# Patient Record
Sex: Male | Born: 1960 | Race: White | Hispanic: No | Marital: Single | Smoking: Former smoker
Health system: Southern US, Community
[De-identification: ages and names within clinical notes are randomized; demographics above are authoritative.]

## PROBLEM LIST (undated history)

## (undated) DIAGNOSIS — K219 Gastro-esophageal reflux disease without esophagitis: Secondary | ICD-10-CM

## (undated) DIAGNOSIS — Z86718 Personal history of other venous thrombosis and embolism: Secondary | ICD-10-CM

## (undated) DIAGNOSIS — J449 Chronic obstructive pulmonary disease, unspecified: Secondary | ICD-10-CM

## (undated) HISTORY — PX: CARDIAC CATHETERIZATION: SHX172

---

## 2010-12-08 LAB — CBC WITH AUTOMATED DIFF
ABS. BASOPHILS: 0 10*3/uL (ref 0.0–0.2)
ABS. EOSINOPHILS: 0 10*3/uL (ref 0.0–0.8)
ABS. IMM. GRANS.: 0 10*3/uL (ref 0.0–0.5)
ABS. LYMPHOCYTES: 1.5 10*3/uL (ref 0.5–4.6)
ABS. MONOCYTES: 1 10*3/uL (ref 0.1–1.3)
ABS. NEUTROPHILS: 8.9 10*3/uL — ABNORMAL HIGH (ref 1.7–8.2)
BASOPHILS: 0 % (ref 0.0–2.0)
EOSINOPHILS: 0 % — ABNORMAL LOW (ref 0.5–7.8)
HCT: 34.5 % — ABNORMAL LOW (ref 41.1–50.3)
HGB: 11.6 g/dL — ABNORMAL LOW (ref 13.2–17.1)
IMMATURE GRANULOCYTES: 0.2 % (ref 0.0–5.0)
LYMPHOCYTES: 13 % (ref 13–44)
MCH: 29.1 PG (ref 26.1–32.9)
MCHC: 33.6 g/dL (ref 31.4–35.0)
MCV: 86.7 FL (ref 79.6–97.8)
MONOCYTES: 9 % (ref 4.0–12.0)
MPV: 10.7 FL — ABNORMAL LOW (ref 10.8–14.1)
NEUTROPHILS: 78 % (ref 43–78)
PLATELET: 186 10*3/uL (ref 150–450)
RBC: 3.98 M/uL — ABNORMAL LOW (ref 4.23–5.67)
RDW: 14.1 % (ref 11.9–14.6)
WBC: 11.4 10*3/uL — ABNORMAL HIGH (ref 4.3–11.1)

## 2010-12-08 LAB — METABOLIC PANEL, COMPREHENSIVE
A-G Ratio: 1.2 (ref 1.2–3.5)
ALT (SGPT): 36 U/L — ABNORMAL LOW (ref 39–65)
AST (SGOT): 44 U/L — ABNORMAL HIGH (ref 15–37)
Albumin: 4 g/dL (ref 3.5–5.0)
Alk. phosphatase: 94 U/L (ref 50–136)
Anion gap: 6 mmol/L — ABNORMAL LOW (ref 7–16)
BUN: 24 MG/DL — ABNORMAL HIGH (ref 6–23)
Bilirubin, total: 0.7 MG/DL (ref 0.2–1.1)
CO2: 26 MMOL/L (ref 23–32)
Calcium: 8.9 MG/DL (ref 8.3–10.4)
Chloride: 107 MMOL/L (ref 98–107)
Creatinine: 1.2 MG/DL (ref 0.8–1.5)
GFR est AA: >60 mL/min/{1.73_m2} (ref >60)
GFR est non-AA: >60 mL/min/{1.73_m2} (ref >60)
Globulin: 3.4 g/dL (ref 2.3–3.5)
Glucose: 115 MG/DL — ABNORMAL HIGH (ref 65–100)
Potassium: 3.4 MMOL/L — ABNORMAL LOW (ref 3.5–5.1)
Protein, total: 7.4 g/dL (ref 6.3–8.2)
Sodium: 139 MMOL/L (ref 136–145)

## 2010-12-08 LAB — POC CARDIAC MARKERS W BNP
BNP: 20 pg/mL (ref 0.0–100.0)
CK-MB: 5.4 ng/mL (ref 0.0–8.0)
Myoglobin: >500 ng/mL — ABNORMAL HIGH (ref 0–170)
Troponin-I: <0.05 ng/mL (ref 0.00–0.30)

## 2010-12-08 LAB — EKG, 12 LEAD, INITIAL
Atrial Rate: 109 {beats}/min
Calculated P Axis: 70 degrees
Calculated R Axis: 23 degrees
Calculated T Axis: 55 degrees
P-R Interval: 156 ms
Q-T Interval: 330 ms
QRS Duration: 84 ms
QTC Calculation (Bezet): 444 ms
Ventricular Rate: 109 {beats}/min

## 2010-12-08 LAB — LIPASE: Lipase: 94 U/L (ref 73–393)

## 2010-12-08 LAB — PROTHROMBIN TIME + INR
INR: 1.1 (ref 0.9–1.2)
Prothrombin time: 11.5 s — ABNORMAL HIGH (ref 9.4–10.8)

## 2010-12-08 LAB — POC CARDIAC MARKERS
CK-MB: 3.5 ng/mL (ref 0.0–8.0)
Myoglobin: 317 ng/mL — ABNORMAL HIGH (ref 0–170)
Troponin-I: <0.05 ng/mL (ref 0.00–0.30)

## 2010-12-08 LAB — PTT: aPTT: 27.9 s (ref 23.5–31.7)

## 2010-12-08 MED ORDER — ASPIRIN 325 MG TAB
325 mg | ORAL | Status: AC
Start: 2010-12-08 — End: 2010-12-08
  Administered 2010-12-08: 06:00:00 via ORAL

## 2010-12-08 MED ORDER — NITROGLYCERIN 0.4 MG SUBLINGUAL TAB
0.4 mg | SUBLINGUAL | Status: DC | PRN
Start: 2010-12-08 — End: 2010-12-08

## 2010-12-08 MED FILL — NITROGLYCERIN 0.4 MG SUBLINGUAL TAB: 0.4 mg | SUBLINGUAL | Qty: 1

## 2010-12-08 MED FILL — ASPIRIN 325 MG TAB: 325 mg | ORAL | Qty: 1

## 2010-12-08 NOTE — ED Notes (Signed)
Pt brought to ER by EMS c/o pt states he was 'paranoid and having chest pain and wandering in the middle of the street " after using cocaine all day today  / passing car called EMS per pt.  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

## 2010-12-08 NOTE — ED Notes (Signed)
I have reviewed medications, follow up provider options, and discharge instructions with the patient. The patient verbalized understanding. Copy of discharge information given to patient upon discharge. Patient discharged ambulatory in no distress.

## 2010-12-08 NOTE — ED Notes (Signed)
Patient states chest pain easing off and that he does not want to take the nitroglycerin tabs.

## 2010-12-08 NOTE — ED Provider Notes (Signed)
HPI Comments: Pt with 2 cardiac stents 2 years ago presents with chest pain, substernal all day long after relapsing and using cocaine all day. Some dyspnea.    Patient is a 49 y.o. male presenting with chest pain. The history is provided by the patient.   Chest Pain (Angina)   This is a new problem. The current episode started 6 to 12 hours ago. The problem has not changed since onset. Episode Length: all day. The problem occurs constantly. The pain is associated with normal activity. The pain is present in the substernal region. The pain is severe. The quality of the pain is described as pressure-like. The pain does not radiate. Associated symptoms include nausea, weakness and shortness of breath. Pertinent negatives include no fever and no vomiting. He has tried nothing for the symptoms. Risk factors include smoking/tobacco exposure, cardiac disease, hypertension and male gender (cocaine). His past medical history is significant for HTN.Procedural history includes cardiac catheterization and cardiac stents.       Past Medical History   Diagnosis Date   ??? COPD    ??? CAD (coronary artery disease)    ??? Hypertension    ??? Seizures    ??? Gastrointestinal disorder      acid reflux and ulcers          Past Surgical History   Procedure Date   ??? Cardiac surg procedure unlist      2 stents           No family history on file.     History   Social History   ??? Marital Status: N/A     Spouse Name: N/A     Number of Children: N/A   ??? Years of Education: N/A   Occupational History   ??? Not on file.   Social History Main Topics   ??? Smoking status: Current Everyday Smoker -- 2.0 packs/day   ??? Smokeless tobacco: Not on file   ??? Alcohol Use: No   ??? Drug Use: Yes     Special: Cocaine   ??? Sexually Active:    Other Topics Concern   ??? Not on file   Social History Narrative   ??? No narrative on file                    ALLERGIES: Review of patient's allergies indicates no known allergies.      Review of Systems    Constitutional: Positive for fatigue. Negative for fever and chills.   Respiratory: Positive for shortness of breath.    Cardiovascular: Positive for chest pain.   Gastrointestinal: Positive for nausea. Negative for vomiting.   Neurological: Positive for weakness.   All other systems reviewed and are negative.        Filed Vitals:    12/08/10 0000   BP: 129/78   Pulse: 117   Temp: 98.7 ??F (37.1 ??C)   Resp: 18   Height: 5\' 10"  (1.778 m)   Weight: 200 lb (90.719 kg)   SpO2: 92%              Physical Exam   Nursing note and vitals reviewed.  Constitutional: He is oriented to person, place, and time. He appears well-developed and well-nourished. He appears distressed.   HENT:   Head: Normocephalic and atraumatic.   Right Ear: External ear normal.   Left Ear: External ear normal.   Eyes: Conjunctivae and EOM are normal. Pupils are equal, round, and reactive to light.  Neck: Normal range of motion. Neck supple.   Cardiovascular: Normal rate, regular rhythm, normal heart sounds and intact distal pulses.    Pulmonary/Chest: Effort normal and breath sounds normal. No respiratory distress. He has no wheezes.   Abdominal: Soft. Bowel sounds are normal. No tenderness.   Musculoskeletal: Normal range of motion. He exhibits no edema and no tenderness.   Neurological: He is alert and oriented to person, place, and time. No cranial nerve deficit. He exhibits normal muscle tone. Coordination normal.   Skin: Skin is warm and dry. No rash noted. He is not diaphoretic. No erythema. No pallor.   Psychiatric: He has a normal mood and affect. His behavior is normal. Judgment and thought content normal.        MDM    Procedures    The following, medication list, allergies, family history, social history, lab results, imaging, were reviewed. Vital signs including pulse oximetry reviewed.    Recent Results (from the past 24 hour(s))   CBC WITH AUTOMATED DIFF    Collection Time    12/08/10 12:50 AM   Component Value Range    ??? WBC 11.4 (*) 4.3 - 11.1 (K/uL)   ??? RBC 3.98 (*) 4.23 - 5.67 (M/uL)   ??? HGB 11.6 (*) 13.2 - 17.1 (g/dL)   ??? HCT 34.5 (*) 41.1 - 50.3 (%)   ??? MCV 86.7  79.6 - 97.8 (FL)   ??? MCH 29.1  26.1 - 32.9 (PG)   ??? MCHC 33.6  31.4 - 35.0 (g/dL)   ??? RDW 14.1  11.9 - 14.6 (%)   ??? PLATELET 186  150 - 450 (K/uL)   ??? MPV 10.7 (*) 10.8 - 14.1 (FL)   ??? DF AUTOMATED     ??? NEUTROPHILS 78  43 - 78 (%)   ??? LYMPHOCYTES 13  13 - 44 (%)   ??? MONOCYTES 9  4.0 - 12.0 (%)   ??? EOSINOPHILS 0 (*) 0.5 - 7.8 (%)   ??? BASOPHILS 0  0.0 - 2.0 (%)   ??? IMM. GRANS. 0.2  0.0 - 5.0 (%)   ??? ABSOLUTE NEUTS 8.9 (*) 1.7 - 8.2 (K/UL)   ??? ABSOLUTE LYMPHS 1.5  0.5 - 4.6 (K/UL)   ??? ABSOLUTE MONOS 1.0  0.1 - 1.3 (K/UL)   ??? ABSOLUTE EOSINS 0.0  0.0 - 0.8 (K/UL)   ??? ABSOLUTE BASOS 0.0  0.0 - 0.2 (K/UL)   ??? ABS. IMM. GRANS. 0.0  0.0 - 0.5 (K/UL)   METABOLIC PANEL, COMPREHENSIVE    Collection Time    12/08/10 12:50 AM   Component Value Range   ??? Sodium 139  136 - 145 (MMOL/L)   ??? Potassium 3.4 (*) 3.5 - 5.1 (MMOL/L)   ??? Chloride 107  98 - 107 (MMOL/L)   ??? CO2 26  23 - 32 (MMOL/L)   ??? Anion gap 6 (*) 7 - 16 (mmol/L)   ??? Glucose 115 (*) 65 - 100 (MG/DL)   ??? BUN 24 (*) 6 - 23 (MG/DL)   ??? Creatinine 1.2  0.8 - 1.5 (MG/DL)   ??? GFR est AA >60  >60 (ml/min/1.48m2)   ??? GFR est non-AA >60  >60 (ml/min/1.48m2)   ??? Calcium 8.9  8.3 - 10.4 (MG/DL)   ??? Bilirubin, total 0.7  0.2 - 1.1 (MG/DL)   ??? ALT 36 (*) 39 - 65 (U/L)   ??? AST 44 (*) 15 - 37 (U/L)   ??? Alk. phosphatase 94  50 - 136 (  U/L)   ??? Protein, total 7.4  6.3 - 8.2 (g/dL)   ??? Albumin 4.0  3.5 - 5.0 (g/dL)   ??? Globulin 3.4  2.3 - 3.5 (g/dL)   ??? A-G Ratio 1.2  1.2 - 3.5 ( )   LIPASE    Collection Time    12/08/10 12:50 AM   Component Value Range   ??? Lipase 94  73 - 393 (U/L)   PROTHROMBIN TIME    Collection Time    12/08/10 12:50 AM   Component Value Range   ??? Prothrombin Time-PT 11.5 (*) 9.4 - 10.8 (sec)   ??? INR 1.1  0.9 - 1.2 ( )   PTT    Collection Time    12/08/10 12:50 AM   Component Value Range    ??? aPTT 27.9  23.5 - 31.7 (SEC)   POC CARDIAC MARKERS W BNP    Collection Time    12/08/10  1:14 AM   Component Value Range   ??? Myoglobin (POC) >500 (*) 0 - 170 (ng/mL)   ??? CK-MB (POC) 5.4  0.0 - 8.0 (ng/mL)   ??? Troponin-I, (POC) <0.05  0.00 - 0.30 (ng/mL)   ??? B-NP 20  0.0 - 100.0 (pg/mL)   POC CARDIAC MARKERS    Collection Time    12/08/10  3:28 AM   Component Value Range   ??? Myoglobin (POC) 317 (*) 0 - 170 (ng/mL)   ??? CK-MB (POC) 3.5  0.0 - 8.0 (ng/mL)   ??? Troponin-I, (POC) <0.05  0.00 - 0.30 (ng/mL)

## 2010-12-08 NOTE — ED Notes (Signed)
ED EKG interpretation:  Rhythm: sinus tachycardia; and regular . Rate (approx.): 109; Axis: normal; P wave: normal; QRS interval: normal ; ST/T wave: normal; Other findings: borderline ekg. This EKG was interpreted by Lavena Bullion, MD,ED Provider.

## 2019-03-01 ENCOUNTER — Inpatient Hospital Stay: Payer: TRICARE (CHAMPUS) | Primary: Family Medicine

## 2019-03-01 ENCOUNTER — Inpatient Hospital Stay
Admit: 2019-03-01 | Discharge: 2019-03-05 | Disposition: A | Discharge status: Home / Self Care | Payer: TRICARE (CHAMPUS) | Attending: Family Medicine | Admitting: Family Medicine

## 2019-03-01 ENCOUNTER — Inpatient Hospital Stay: Admit: 2019-03-01 | Payer: TRICARE (CHAMPUS) | Primary: Family Medicine

## 2019-03-01 ENCOUNTER — Emergency Department: Admit: 2019-03-01 | Payer: TRICARE (CHAMPUS) | Primary: Family Medicine

## 2019-03-01 DIAGNOSIS — B159 Hepatitis A without hepatic coma: Principal | ICD-10-CM

## 2019-03-01 LAB — CBC WITH AUTO DIFFERENTIAL
Basophils %: 1 % (ref 0.0–2.0)
Basophils Absolute: 0 10*3/uL (ref 0.0–0.2)
Eosinophils %: 2 % (ref 0.5–7.8)
Eosinophils Absolute: 0.1 10*3/uL (ref 0.0–0.8)
Granulocyte Absolute Count: 0 10*3/uL (ref 0.0–0.5)
Hematocrit: 32.1 % — ABNORMAL LOW (ref 41.1–50.3)
Hemoglobin: 10.6 g/dL — ABNORMAL LOW (ref 13.6–17.2)
Immature Granulocytes: 1 % (ref 0.0–5.0)
Lymphocytes %: 35 % (ref 13–44)
Lymphocytes Absolute: 1.5 10*3/uL (ref 0.5–4.6)
MCH: 30.3 PG (ref 26.1–32.9)
MCHC: 33 g/dL (ref 31.4–35.0)
MCV: 91.7 FL (ref 79.6–97.8)
MPV: 12.2 FL (ref 9.4–12.3)
Monocytes %: 14 % — ABNORMAL HIGH (ref 4.0–12.0)
Monocytes Absolute: 0.6 10*3/uL (ref 0.1–1.3)
NRBC Absolute: 0 10*3/uL (ref 0.0–0.2)
Neutrophils %: 47 % (ref 43–78)
Neutrophils Absolute: 2.1 10*3/uL (ref 1.7–8.2)
Platelets: 148 10*3/uL — ABNORMAL LOW (ref 150–450)
RBC: 3.5 M/uL — ABNORMAL LOW (ref 4.23–5.6)
RDW: 14.6 % (ref 11.9–14.6)
WBC: 4.3 10*3/uL (ref 4.3–11.1)

## 2019-03-01 LAB — BILIRUBIN, DIRECT
Bilirubin, Direct: 4 MG/DL — ABNORMAL HIGH (ref <0.4)
Bilirubin, direct: 4 MG/DL — ABNORMAL HIGH (ref <0.4)

## 2019-03-01 LAB — PROCALCITONIN
Procalcitonin: 3.44 ng/mL
Procalcitonin: 3.44 ng/mL

## 2019-03-01 LAB — PROTIME-INR
INR: 1.4
Protime: 18 s — ABNORMAL HIGH (ref 12.0–14.7)

## 2019-03-01 LAB — COMPREHENSIVE METABOLIC PANEL
ALT: 964 U/L — ABNORMAL HIGH (ref 12–65)
AST: 928 U/L — ABNORMAL HIGH (ref 15–37)
Albumin/Globulin Ratio: 0.5 — ABNORMAL LOW (ref 1.2–3.5)
Albumin: 2.1 g/dL — ABNORMAL LOW (ref 3.5–5.0)
Alkaline Phosphatase: 178 U/L — ABNORMAL HIGH (ref 50–136)
Anion Gap: 7 mmol/L (ref 7–16)
BUN: 20 MG/DL (ref 6–23)
CO2: 24 mmol/L (ref 21–32)
Calcium: 7.7 MG/DL — ABNORMAL LOW (ref 8.3–10.4)
Chloride: 103 mmol/L (ref 98–107)
Creatinine: 0.94 MG/DL (ref 0.8–1.5)
EGFR IF NonAfrican American: >60 mL/min/{1.73_m2} (ref >60)
GFR African American: >60 mL/min/{1.73_m2} (ref >60)
Globulin: 4.3 g/dL — ABNORMAL HIGH (ref 2.3–3.5)
Glucose: 111 mg/dL — ABNORMAL HIGH (ref 65–100)
Potassium: 3.4 mmol/L — ABNORMAL LOW (ref 3.5–5.1)
Sodium: 134 mmol/L — ABNORMAL LOW (ref 136–145)
Total Bilirubin: 4.9 MG/DL — ABNORMAL HIGH (ref 0.2–1.1)
Total Protein: 6.4 g/dL (ref 6.3–8.2)

## 2019-03-01 LAB — URINALYSIS W/ RFLX MICROSCOPIC
Blood, Urine: NEGATIVE
Blood: NEGATIVE
Glucose, Ur: NEGATIVE mg/dL
Glucose: NEGATIVE mg/dL
Ketone: NEGATIVE mg/dL
Ketones, Urine: NEGATIVE mg/dL
Leukocyte Esterase, Urine: NEGATIVE
Leukocyte Esterase: NEGATIVE
Nitrite, Urine: NEGATIVE
Nitrites: NEGATIVE
Protein, UA: NEGATIVE mg/dL
Protein: NEGATIVE mg/dL
Specific Gravity, UA: 1.01 (ref 1.001–1.023)
Specific gravity: 1.01 (ref 1.001–1.023)
Urobilinogen, UA, POCT: 4 EU/dL — ABNORMAL HIGH (ref 0.2–1.0)
Urobilinogen: 4 EU/dL — ABNORMAL HIGH (ref 0.2–1.0)
pH (UA): 6 (ref 5.0–9.0)
pH, UA: 6 (ref 5.0–9.0)

## 2019-03-01 LAB — EKG 12-LEAD
Atrial Rate: 78 {beats}/min
P Axis: 75 degrees
P-R Interval: 154 ms
Q-T Interval: 388 ms
QRS Duration: 100 ms
QTc Calculation (Bazett): 442 ms
R Axis: -13 degrees
T Axis: 40 degrees
Ventricular Rate: 78 {beats}/min

## 2019-03-01 LAB — TRANSFERRIN SATURATION
Iron: 68 ug/dL (ref 35–150)
Iron: 68 ug/dL (ref 35–150)
TIBC: 168 ug/dL — ABNORMAL LOW (ref 250–450)
TIBC: 168 ug/dL — ABNORMAL LOW (ref 250–450)
TRANSFERRIN SATURATION: 40 % (ref >20)
Transferrin Saturation: 40 % (ref >20)

## 2019-03-01 LAB — DRUG SCREEN, URINE
AMPHETAMINES: POSITIVE
Amphetamine Screen, Urine: POSITIVE
BARBITURATES: NEGATIVE
BENZODIAZEPINES: NEGATIVE
Barbiturate Screen, Urine: NEGATIVE
Benzodiazepine Screen, Urine: NEGATIVE
COCAINE: NEGATIVE
Cocaine Screen Urine: NEGATIVE
METHADONE: NEGATIVE
Methadone Screen, Urine: NEGATIVE
OPIATES: NEGATIVE
Opiate Screen, Urine: NEGATIVE
PCP Screen, Urine: NEGATIVE
PCP(PHENCYCLIDINE): NEGATIVE
THC (TH-CANNABINOL): NEGATIVE
THC Screen, Urine: NEGATIVE

## 2019-03-01 LAB — LACTATE DEHYDROGENASE: LD: 215 U/L — ABNORMAL HIGH (ref 100–190)

## 2019-03-01 LAB — ACETAMINOPHEN LEVEL: Acetaminophen Level: <10 ug/mL — ABNORMAL LOW (ref 10.0–30.0)

## 2019-03-01 LAB — LACTIC ACID
Lactic Acid: 4.4 MMOL/L (ref 0.4–2.0)
Lactic Acid: 2.6 MMOL/L (ref 0.4–2.0)
Lactic acid: 4.4 MMOL/L — CR (ref 0.4–2.0)
Lactic acid: 2.6 MMOL/L — CR (ref 0.4–2.0)

## 2019-03-01 LAB — RAPID INFLUENZA A/B ANTIGENS
Influenza A Ag: NEGATIVE
Influenza B Ag: NEGATIVE

## 2019-03-01 LAB — TROPONIN: Troponin I: <0.02 NG/ML — ABNORMAL LOW (ref 0.02–0.05)

## 2019-03-01 LAB — FERRITIN
Ferritin: 411 NG/ML — ABNORMAL HIGH (ref 8–388)
Ferritin: 411 NG/ML — ABNORMAL HIGH (ref 8–388)

## 2019-03-01 LAB — INFLUENZA A & B AG (RAPID TEST)
Influenza A Ag: NEGATIVE
Influenza B Ag: NEGATIVE

## 2019-03-01 LAB — CBC WITH AUTOMATED DIFF
ABS. BASOPHILS: 0 10*3/uL (ref 0.0–0.2)
ABS. EOSINOPHILS: 0.1 10*3/uL (ref 0.0–0.8)
ABS. IMM. GRANS.: 0 10*3/uL (ref 0.0–0.5)
ABS. LYMPHOCYTES: 1.5 10*3/uL (ref 0.5–4.6)
ABS. MONOCYTES: 0.6 10*3/uL (ref 0.1–1.3)
ABS. NEUTROPHILS: 2.1 10*3/uL (ref 1.7–8.2)
ABSOLUTE NRBC: 0 10*3/uL (ref 0.0–0.2)
BASOPHILS: 1 % (ref 0.0–2.0)
EOSINOPHILS: 2 % (ref 0.5–7.8)
HCT: 32.1 % — ABNORMAL LOW (ref 41.1–50.3)
HGB: 10.6 g/dL — ABNORMAL LOW (ref 13.6–17.2)
IMMATURE GRANULOCYTES: 1 % (ref 0.0–5.0)
LYMPHOCYTES: 35 % (ref 13–44)
MCH: 30.3 PG (ref 26.1–32.9)
MCHC: 33 g/dL (ref 31.4–35.0)
MCV: 91.7 FL (ref 79.6–97.8)
MONOCYTES: 14 % — ABNORMAL HIGH (ref 4.0–12.0)
MPV: 12.2 FL (ref 9.4–12.3)
NEUTROPHILS: 47 % (ref 43–78)
PLATELET: 148 10*3/uL — ABNORMAL LOW (ref 150–450)
RBC: 3.5 M/uL — ABNORMAL LOW (ref 4.23–5.6)
RDW: 14.6 % (ref 11.9–14.6)
WBC: 4.3 10*3/uL (ref 4.3–11.1)

## 2019-03-01 LAB — METABOLIC PANEL, COMPREHENSIVE
A-G Ratio: 0.5 — ABNORMAL LOW (ref 1.2–3.5)
ALT (SGPT): 964 U/L — ABNORMAL HIGH (ref 12–65)
AST (SGOT): 928 U/L — ABNORMAL HIGH (ref 15–37)
Albumin: 2.1 g/dL — ABNORMAL LOW (ref 3.5–5.0)
Alk. phosphatase: 178 U/L — ABNORMAL HIGH (ref 50–136)
Anion gap: 7 mmol/L (ref 7–16)
BUN: 20 MG/DL (ref 6–23)
Bilirubin, total: 4.9 MG/DL — ABNORMAL HIGH (ref 0.2–1.1)
CO2: 24 mmol/L (ref 21–32)
Calcium: 7.7 MG/DL — ABNORMAL LOW (ref 8.3–10.4)
Chloride: 103 mmol/L (ref 98–107)
Creatinine: 0.94 MG/DL (ref 0.8–1.5)
GFR est AA: >60 mL/min/{1.73_m2} (ref >60)
GFR est non-AA: >60 mL/min/{1.73_m2} (ref >60)
Globulin: 4.3 g/dL — ABNORMAL HIGH (ref 2.3–3.5)
Glucose: 111 mg/dL — ABNORMAL HIGH (ref 65–100)
Potassium: 3.4 mmol/L — ABNORMAL LOW (ref 3.5–5.1)
Protein, total: 6.4 g/dL (ref 6.3–8.2)
Sodium: 134 mmol/L — ABNORMAL LOW (ref 136–145)

## 2019-03-01 LAB — EKG, 12 LEAD, INITIAL
Atrial Rate: 78 {beats}/min
Calculated P Axis: 75 degrees
Calculated R Axis: -13 degrees
Calculated T Axis: 40 degrees
P-R Interval: 154 ms
Q-T Interval: 388 ms
QRS Duration: 100 ms
QTC Calculation (Bezet): 442 ms
Ventricular Rate: 78 {beats}/min

## 2019-03-01 LAB — ACETAMINOPHEN: Acetaminophen level: <10 ug/mL — ABNORMAL LOW (ref 10.0–30.0)

## 2019-03-01 LAB — PROTHROMBIN TIME + INR
INR: 1.4
Prothrombin time: 18 s — ABNORMAL HIGH (ref 12.0–14.7)

## 2019-03-01 LAB — TROPONIN I: Troponin-I, Qt.: <0.02 NG/ML — ABNORMAL LOW (ref 0.02–0.05)

## 2019-03-01 LAB — LD: LD: 215 U/L — ABNORMAL HIGH (ref 100–190)

## 2019-03-01 MED ORDER — ONDANSETRON (PF) 4 MG/2 ML INJECTION
4 mg/2 mL | INTRAMUSCULAR | Status: DC | PRN
Start: 2019-03-01 — End: 2019-03-05
  Administered 2019-03-01 – 2019-03-04 (×4): via INTRAVENOUS

## 2019-03-01 MED ORDER — SENNOSIDES-DOCUSATE SODIUM 8.6 MG-50 MG TAB
Freq: Every day | ORAL | Status: DC | PRN
Start: 2019-03-01 — End: 2019-03-05

## 2019-03-01 MED ORDER — LACTATED RINGERS BOLUS IV
Freq: Once | INTRAVENOUS | Status: AC
Start: 2019-03-01 — End: 2019-03-01
  Administered 2019-03-01: 18:00:00 via INTRAVENOUS

## 2019-03-01 MED ORDER — METRONIDAZOLE IN SODIUM CHLORIDE (ISO-OSM) 500 MG/100 ML IV PIGGY BACK
500 mg/100 mL | INTRAVENOUS | Status: AC
Start: 2019-03-01 — End: 2019-03-01
  Administered 2019-03-01: 18:00:00 via INTRAVENOUS

## 2019-03-01 MED ORDER — HEPARIN (PORCINE) 5,000 UNIT/ML IJ SOLN
5000 unit/mL | Freq: Three times a day (TID) | INTRAMUSCULAR | Status: DC
Start: 2019-03-01 — End: 2019-03-05
  Administered 2019-03-01 – 2019-03-05 (×11): via SUBCUTANEOUS

## 2019-03-01 MED ORDER — ADV ADDAPTOR
1 gram | Status: AC
Start: 2019-03-01 — End: 2019-03-01
  Administered 2019-03-01: 18:00:00 via INTRAVENOUS

## 2019-03-01 MED ORDER — SODIUM CHLORIDE 0.9 % IJ SYRG
INTRAMUSCULAR | Status: DC | PRN
Start: 2019-03-01 — End: 2019-03-05

## 2019-03-01 MED ORDER — LACTATED RINGERS BOLUS IV
Freq: Once | INTRAVENOUS | Status: AC
Start: 2019-03-01 — End: 2019-03-01
  Administered 2019-03-01: 14:00:00 via INTRAVENOUS

## 2019-03-01 MED ORDER — DIPHENHYDRAMINE 25 MG CAP
25 mg | ORAL | Status: DC | PRN
Start: 2019-03-01 — End: 2019-03-05

## 2019-03-01 MED ORDER — FLU VACCINE QV 2019-20 (6 MOS+)(PF) 60 MCG (15 MCG X 4)/0.5 ML IM SYRINGE
60 mcg (15 mcg x 4)/0.5 mL | INTRAMUSCULAR | Status: DC
Start: 2019-03-01 — End: 2019-03-05

## 2019-03-01 MED ORDER — MORPHINE 2 MG/ML INJECTION
2 mg/mL | INTRAMUSCULAR | Status: DC | PRN
Start: 2019-03-01 — End: 2019-03-05
  Administered 2019-03-01 – 2019-03-05 (×16): via INTRAVENOUS

## 2019-03-01 MED ORDER — SODIUM CHLORIDE 0.9% BOLUS IV
0.9 % | Freq: Once | INTRAVENOUS | Status: AC
Start: 2019-03-01 — End: 2019-03-01
  Administered 2019-03-01: 22:00:00 via INTRAVENOUS

## 2019-03-01 MED ORDER — PIPERACILLIN-TAZOBACTAM 3.375 GRAM IV SOLR
3.375 gram | Freq: Three times a day (TID) | INTRAVENOUS | Status: DC
Start: 2019-03-01 — End: 2019-03-05
  Administered 2019-03-01 – 2019-03-05 (×14): via INTRAVENOUS

## 2019-03-01 MED ORDER — IPRATROPIUM-ALBUTEROL 2.5 MG-0.5 MG/3 ML NEB SOLUTION
2.5 mg-0.5 mg/3 ml | Freq: Four times a day (QID) | RESPIRATORY_TRACT | Status: DC | PRN
Start: 2019-03-01 — End: 2019-03-05

## 2019-03-01 MED ORDER — SODIUM CHLORIDE 0.9 % IJ SYRG
Freq: Three times a day (TID) | INTRAMUSCULAR | Status: DC
Start: 2019-03-01 — End: 2019-03-05
  Administered 2019-03-01 – 2019-03-05 (×12): via INTRAVENOUS

## 2019-03-01 MED FILL — PIPERACILLIN-TAZOBACTAM 3.375 GRAM IV SOLR: 3.375 gram | INTRAVENOUS | Qty: 3.38

## 2019-03-01 MED FILL — SENNA PLUS 8.6 MG-50 MG TABLET: ORAL | Qty: 2

## 2019-03-01 MED FILL — ONDANSETRON (PF) 4 MG/2 ML INJECTION: 4 mg/2 mL | INTRAMUSCULAR | Qty: 2

## 2019-03-01 MED FILL — MORPHINE 2 MG/ML INJECTION: 2 mg/mL | INTRAMUSCULAR | Qty: 1

## 2019-03-01 MED FILL — HEPARIN (PORCINE) 5,000 UNIT/ML IJ SOLN: 5000 unit/mL | INTRAMUSCULAR | Qty: 1

## 2019-03-01 MED FILL — METRONIDAZOLE IN SODIUM CHLORIDE (ISO-OSM) 500 MG/100 ML IV PIGGY BACK: 500 mg/100 mL | INTRAVENOUS | Qty: 100

## 2019-03-01 MED FILL — CEFTRIAXONE 1 GRAM SOLUTION FOR INJECTION: 1 gram | INTRAMUSCULAR | Qty: 1

## 2019-03-01 NOTE — Progress Notes (Addendum)
Pt found in bathroom. IV fluid running on floor. Pt reports he vomited. zofran IVP 4mg administered.   Stool sample in hat collected and sent to lab for occult stool study.   Pt returned to bed. Pt c/o of head hurting but refuses CT scan of head. Asking for next dose of pain medication.

## 2019-03-01 NOTE — Other (Signed)
TRANSFER - IN REPORT:    Verbal report received from Amy,RN(name) on Vincent Smith  being received from ED(unit) for routine progression of care      Report consisted of patient???s Situation, Background, Assessment and   Recommendations(SBAR).     Information from the following report(s) SBAR, Kardex, ED Summary, [facility] and Recent Results was reviewed with the receiving nurse.    Opportunity for questions and clarification was provided.      Assessment completed upon patient???s arrival to unit and care assumed.

## 2019-03-01 NOTE — Progress Notes (Cosign Needed)
03/01/19 1439   Dual Skin Pressure Injury Assessment   Dual Skin Pressure Injury Assessment WDL   Second Care Provider (Based on Facility Policy) Tina, RN     Pt A&Ox4. pt has no skin breakdown. Pt appears jaundiced. Call light placed within reach. Bed low and locked.

## 2019-03-01 NOTE — ED Notes (Signed)
Hospitalist at bedside for admission evaluation

## 2019-03-01 NOTE — H&P (Addendum)
Hospitalist Note     Admit Date:  03/01/2019  8:04 AM   Name:  Vincent Smith   Age:  58 y.o.  DOB:  October 02, 1961   MRN:  [id number]   PCP:  Lacey Jensen, MD  Treatment Team: Attending Provider: Helmut Muster, MD; Primary Nurse: Santina Evans, RN    HPI/Subjective:     Mr. Seith is a 58 year old male with past medical history of MI (3-71yr ago) s/p 2 stents, COPD who presented to ED with many vague complaints.   Patient states "I feel like crap" for past week. He feels tired and lethargic. He has decreased appetite and has not eaten in a few days. He also complains of diffused dull abdominal pain that is not related to eating.  Denies any chills but feels like he has had fever. Unable to tell if has any night sweats or weight loss.  He denies any IV drug use. He has used marijuana in the past. Smoked for about a year 170yrago.   Denies any recent travel, sick contact, prior liver disease, family hx of liver diseases. Denies EtOH use.    He states he is concern about HIV exposure as he was recently told that his estranged wife has HIV and "she does not look good".   He also complains of increased urinary frequency without any hesitancy, dribbling or dysuria.   Reports not being on any daily medications at home.     Patient was seen at [facility] on 12/28/2018 for chest pain.  CT abdomen pelvis done at that time showed cirrhotic liver, splenomegaly and cholelithiasis within the gallbladder without intrahepatic or extrahepatic biliary ductal dilation.  LFTs at that time were within normal.    In the ED, labs are notable for elevated liver enzymes (ALT 964, AST 928, ALP 178, total bili 4.9) , lactic acid of 4.4 and procalc of 3.44.  Patient appeared ill with jaundice and sclera icterus.  INR is 1.4. CXR  10 systems reviewed and negative except as noted in HPI.    Past Medical History:   Diagnosis Date   ??? CAD (coronary artery disease)    ??? COPD    ??? Gastrointestinal disorder     acid reflux and ulcers   ??? Hypertension     ??? [place]      Past Surgical History:   Procedure Laterality Date   ??? CARDIAC SURG PROCEDURE UNLIST      2 stents      No Known Allergies   Social History     Tobacco Use   ??? Smoking status: Former Smoker     Packs/day: 2.00   Substance Use Topics   ??? Alcohol use: No      Family History   Problem Relation Age of Onset   ??? Hypertension Mother    ??? Hypertension Father    ??? Diabetes Father         There is no immunization history on file for this patient.  PTA Medications:  Prior to Admission Medications   Prescriptions Last Dose Informant Patient Reported? Taking?   HYDROcodone-acetaminophen (NORCO) 10-325 mg tablet   Yes No   Sig: Take 1 Tab by mouth as needed.   IPRATROPIUM/ALBUTEROL SULFATE (COMBIVENT IN)   Yes No   Sig: Take  by inhalation two (2) times a day.   OTHER   Yes No   Sig: daily. BP med  / unknown name or dose    OTHER  Yes No   Sig: daily. Cholesterol med / unknown name or dose    clopidogrel (PLAVIX) 75 mg tablet   Yes No   Sig: Take 75 mg by mouth daily.   nitroglycerin (NITROSTAT) 0.4 mg SL tablet   Yes No   Sig: 0.4 mg by SubLINGual route every five (5) minutes as needed.      Facility-Administered Medications: None       Objective:     Patient Vitals for the past 24 hrs:   Temp Pulse Resp BP SpO2   03/01/19 1240 ??? 75 20 116/73 98 %   03/01/19 0811 98.1 ??F (36.7 ??C) 76 16 115/61 96 %     Oxygen Therapy  O2 Sat (%): 98 % (03/01/19 1240)  Pulse via Oximetry: 76 beats per minute (03/01/19 1240)  O2 Device: Room air (03/01/19 0811)    Estimated body mass index is 27.26 kg/m?? as calculated from the following:    Height as of this encounter: 5' 10"  (1.778 m).    Weight as of this encounter: 86.2 kg (190 lb).    No intake or output data in the 24 hours ending 03/01/19 1303    *Note that automatically entered I/Os may not be accurate; dependent on patient compliance with collection and accurate data entry by assistants.      Physical Exam:  General:     alert, awake, jaundice, ill appearing   Head:   normocephalic, atraumatic  Eyes, Ears, nose: PERRL, EOMI. Sclera icterus  Neck:    supple, non-tender   Lungs:   Crackles on the bases without any wheezing.  Cardiac:   RRR, Normal S1 and S2.    Abdomen:   Soft, slightly distended, TTP diffusely without guarding  Extremities:   Warm, dry. No edema   Skin:   No rashes, no jaundice  Neuro:  Alert and oriented to person, time, place, situation. No gross focal neurological deficit  Psychiatric:  No anxiety, calm, cooperative      Data Review:   Recent Results (from the past 24 hour(s))   EKG, 12 LEAD, INITIAL    Collection Time: 03/01/19  8:07 AM   Result Value Ref Range    Ventricular Rate 78 BPM    Atrial Rate 78 BPM    P-R Interval 154 ms    QRS Duration 100 ms    Q-T Interval 388 ms    QTC Calculation (Bezet) 442 ms    Calculated P Axis 75 degrees    Calculated R Axis -13 degrees    Calculated T Axis 40 degrees    Diagnosis         Sinus rhythm with Premature supraventricular complexes and Premature   ventricular complexes or Fusion complexes  Otherwise normal ECG  When compared with ECG of 08-Dec-2010 00:24,  Premature ventricular complexes are now Present  Premature supraventricular complexes are now Present  Confirmed by Driscilla Grammes ([id number]) on 03/01/2019 12:41:26 PM     TROPONIN I    Collection Time: 03/01/19  8:11 AM   Result Value Ref Range    Troponin-I, Qt. <0.02 (L) 0.02 - 0.05 NG/ML   CBC WITH AUTOMATED DIFF    Collection Time: 03/01/19  8:11 AM   Result Value Ref Range    WBC 4.3 4.3 - 11.1 K/uL    RBC 3.50 (L) 4.23 - 5.6 M/uL    HGB 10.6 (L) 13.6 - 17.2 g/dL    HCT 32.1 (L) 41.1 - 50.3 %  MCV 91.7 79.6 - 97.8 FL    MCH 30.3 26.1 - 32.9 PG    MCHC 33.0 31.4 - 35.0 g/dL    RDW 14.6 11.9 - 14.6 %    PLATELET 148 (L) 150 - 450 K/uL    MPV 12.2 9.4 - 12.3 FL    ABSOLUTE NRBC 0.00 0.0 - 0.2 K/uL    NEUTROPHILS 47 43 - 78 %    LYMPHOCYTES 35 13 - 44 %    MONOCYTES 14 (H) 4.0 - 12.0 %    EOSINOPHILS 2 0.5 - 7.8 %    BASOPHILS 1 0.0 - 2.0 %     IMMATURE GRANULOCYTES 1 0.0 - 5.0 %    ABS. NEUTROPHILS 2.1 1.7 - 8.2 K/UL    ABS. LYMPHOCYTES 1.5 0.5 - 4.6 K/UL    ABS. MONOCYTES 0.6 0.1 - 1.3 K/UL    ABS. EOSINOPHILS 0.1 0.0 - 0.8 K/UL    ABS. BASOPHILS 0.0 0.0 - 0.2 K/UL    ABS. IMM. GRANS. 0.0 0.0 - 0.5 K/UL    DF AUTOMATED     METABOLIC PANEL, COMPREHENSIVE    Collection Time: 03/01/19  8:11 AM   Result Value Ref Range    Sodium 134 (L) 136 - 145 mmol/L    Potassium 3.4 (L) 3.5 - 5.1 mmol/L    Chloride 103 98 - 107 mmol/L    CO2 24 21 - 32 mmol/L    Anion gap 7 7 - 16 mmol/L    Glucose 111 (H) 65 - 100 mg/dL    BUN 20 6 - 23 MG/DL    Creatinine 0.94 0.8 - 1.5 MG/DL    GFR est AA >60 >60 ml/min/1.60m    GFR est non-AA >60 >60 ml/min/1.772m   Calcium 7.7 (L) 8.3 - 10.4 MG/DL    Bilirubin, total 4.9 (H) 0.2 - 1.1 MG/DL    ALT (SGPT) 964 (H) 12 - 65 U/L    AST (SGOT) 928 (H) 15 - 37 U/L    Alk. phosphatase 178 (H) 50 - 136 U/L    Protein, total 6.4 6.3 - 8.2 g/dL    Albumin 2.1 (L) 3.5 - 5.0 g/dL    Globulin 4.3 (H) 2.3 - 3.5 g/dL    A-G Ratio 0.5 (L) 1.2 - 3.5     PROCALCITONIN    Collection Time: 03/01/19  8:11 AM   Result Value Ref Range    Procalcitonin 3.44 ng/mL   PROTHROMBIN TIME + INR    Collection Time: 03/01/19  8:11 AM   Result Value Ref Range    Prothrombin time 18.0 (H) 12.0 - 14.7 sec    INR 1.4     INFLUENZA A & B AG (RAPID TEST)    Collection Time: 03/01/19  9:18 AM   Result Value Ref Range    Influenza A Ag NEGATIVE  NEG      Influenza B Ag NEGATIVE  NEG      Source Nasopharyngeal     LACTIC ACID    Collection Time: 03/01/19 10:31 AM   Result Value Ref Range    Lactic acid 4.4 (HH) 0.4 - 2.0 MMOL/L       All Micro Results     Procedure Component Value Units Date/Time    CULTURE, BLOOD [[id number]]ollected:  03/01/19 1235    Order Status:  Completed Specimen:  Blood Updated:  03/01/19 1301    CULTURE, BLOOD [[id number]]ollected:  03/01/19 1235    Order Status:  Completed Specimen:  Blood Updated:  03/01/19 1301     INFLUENZA A & B AG (RAPID TEST) [[id number]] Collected:  03/01/19 0918    Order Status:  Completed Specimen:  Nasopharyngeal from Nasal washing Updated:  03/01/19 0939     Influenza A Ag NEGATIVE         Comment: NEGATIVE FOR THE PRESENCE OF INFLUENZA A ANTIGEN  INFECTION DUE TO INFLUENZA A CANNOT BE RULED OUT.  BECAUSE THE ANTIGEN PRESENT IN THE SAMPLE MAY BE BELOW  THE DETECTION LIMIT OF THE TEST.  A NEGATIVE TEST IS PRESUMPTIVE AND IT IS RECOMMENDED THAT THESE RESULTS BE CONFIRMED BY VIRAL CULTURE OR AN FDA-CLEARED INFLUENZA A AND B MOLECULAR ASSAY.          Influenza B Ag NEGATIVE         Comment: NEGATIVE FOR THE PRESENCE OF INFLUENZA B ANTIGEN  INFECTION DUE TO INFLUENZA B CANNOT BE RULED OUT.  BECAUSE THE ANTIGEN PRESENT IN THE SAMPLE MAY BE BELOW  THE DETECTION LIMIT OF THE TEST.  A NEGATIVE TEST IS PRESUMPTIVE AND IT IS RECOMMENDED THAT THESE RESULTS BE CONFIRMED BY VIRAL CULTURE OR AN FDA-CLEARED INFLUENZA A AND B MOLECULAR ASSAY.          Source Nasopharyngeal             Current Facility-Administered Medications   Medication Dose Route Frequency   ??? lactated ringers bolus infusion 1,000 mL  1,000 mL IntraVENous ONCE   ??? cefTRIAXone (ROCEPHIN) 1 g in 0.9% sodium chloride (MBP/ADV) 50 mL  1 g IntraVENous NOW   ??? metroNIDAZOLE (FLAGYL) IVPB premix 500 mg  500 mg IntraVENous NOW   ??? sodium chloride (NS) flush 5-40 mL  5-40 mL IntraVENous Q8H   ??? sodium chloride (NS) flush 5-40 mL  5-40 mL IntraVENous PRN   ??? diphenhydrAMINE (BENADRYL) capsule 25 mg  25 mg Oral Q4H PRN   ??? ondansetron (ZOFRAN) injection 4 mg  4 mg IntraVENous Q4H PRN   ??? senna-docusate (PERICOLACE) 8.6-50 mg per tablet 2 Tab  2 Tab Oral DAILY PRN   ??? heparin (porcine) injection 5,000 Units  5,000 Units SubCUTAneous Q8H     Current Outpatient Medications   Medication Sig   ??? clopidogrel (PLAVIX) 75 mg tablet Take 75 mg by mouth daily.   ??? nitroglycerin (NITROSTAT) 0.4 mg SL tablet 0.4 mg by SubLINGual route every five (5) minutes as needed.    ??? IPRATROPIUM/ALBUTEROL SULFATE (COMBIVENT IN) Take  by inhalation two (2) times a day.   ??? HYDROcodone-acetaminophen (NORCO) 10-325 mg tablet Take 1 Tab by mouth as needed.   ??? OTHER daily. BP med  / unknown name or dose    ??? OTHER daily. Cholesterol med / unknown name or dose        Other Studies:  Xr Chest Port    Result Date: 03/01/2019  1 View portable chest x-ray 03/01/2019 8:32 AM Indication: Chest pain Comparison: 12/08/2010 Findings: This portable upright AP chest of 0809 shows the monitoring leads overlying the chest. Lung volumes are low, only slight crowding though suggested at the bases. Cardiomediastinal silhouette within normal limits. No confluent infiltrate, no significant effusion. Vascularity seems appropriate.     IMPRESSION: Mild pulmonary hypoinflation. Otherwise no acute infiltrate suggested.       Assessment and Plan:     Hospital Problems as of 03/01/2019 Never Reviewed          Codes Class Noted - Resolved POA    Jaundice  ICD-10-CM: R17  ICD-9-CM: 782.4  03/01/2019 - Present Unknown        Elevated liver enzymes ICD-10-CM: R74.8  ICD-9-CM: 790.5  03/01/2019 - Present Unknown              Plan:  Mr. Klima is a 58 year old male with past medical history of MI (3-39yr ago) s/p 2 stents, COPD who presented to ED with many vague complaints.     Abdominal Pain, Jaundice, Hepatocellular pattern of elevated Liver Enzymes   Cholecystitis due to cholelithiasis  - ALT 964, AST 928, ALP 178, total bili 4.9. Albumin 2.1 with normal INR and platelet.   - no leukocytosis or fever  - ultrasound Abd : Cholelithiasis within the gallbladder with wall thickening.  No biliary tree obstruction.  - hepatitis panel, HIV, drug screen, tylenol level, urine analysis  - zosyn (3/7)  - GI consult       Lactic acidosis  - elevated procalc, fatigue with subjected fevers at home. abdominal exam is not impressive for bowel ischemia  - Afrebril, no leukocytosis on arrival.  CXR showed no acute infectious process. UA is neg   - s/p ceftriaxone and flagyl by ED  - f/u BCx  - zosyn as above  - repeat Lactic acid    Normocytic Anemia  - iron, ferritin, TIBC   - stool occult    MI s/p stents (3-435yrago)  - not on any meds    COPD  - not on any meds  - nebs prn  - o2 supplement as needed    Diet:  DIET REGULAR  DVT PPx: heparin sq  Code: Full Code  Estimated LOS:  Greater than 2 midnights      Medical Decision Making:    Labs/Imaging reviewed.  Additional information obtained from ED provider.  Patient is high risk due to medical condition and comorbidities. In addition, requires monitoring due to receiving medications.  Plan discussed with nursing staff.  Plan discussed with patient/family.  All questions/concerns were addressed. Pt/family agrees with the plan.       Signed:  BaHelmut MusterMD

## 2019-03-01 NOTE — Progress Notes (Signed)
Order received to bolus pt 500 cc NS for lactic acid of 2.6

## 2019-03-01 NOTE — Progress Notes (Signed)
Pt called hotel he was at and ask hotel attendant about when he fell. Pt reports that the attendant witnessed him fall into chair and pt reportedly did not hit his head. Pt does not want to complete CT scan of head.   CT scan to be cancelled.

## 2019-03-01 NOTE — ED Triage Notes (Signed)
Patient arrived via EMS from hotel lobby in [place].  EMS called due to cp that began 1 hour ago.  Patient c/o mid sternal localized cp that is 8/10 on numeric pain scale.  Patient states he thinks its from stress.  EMS reports that patient states cp began when he found out his ex-wife is HIV positive.  EMS stats patient is homeless.  A+O x4 with EMS.  BP 129/73, HR 90bpm NSR, afebrile, O2 sat 98% on RA.  EMs placed 18g left FA.  EMS administered NS 400ml bolus, Nitro SL tabs x2, and ASA 324.

## 2019-03-01 NOTE — Other (Signed)
TRANSFER - OUT REPORT:    Verbal report given to Judeth Cornfield, RN on Vincent Smith  being transferred to 6th floor for routine progression of care       Report consisted of patient???s Situation, Background, Assessment and   Recommendations(SBAR).     Information from the following report(s) ED Summary was reviewed with the receiving nurse.    Lines:   Peripheral IV 03/01/19 Left Forearm (Active)   Site Assessment Clean, dry, & intact 03/01/2019 12:48 PM   Phlebitis Assessment 0 03/01/2019 12:48 PM   Infiltration Assessment 0 03/01/2019 12:48 PM   Dressing Status Clean, dry, & intact 03/01/2019 12:48 PM       Peripheral IV 03/01/19 Right Antecubital (Active)   Site Assessment Clean, dry, & intact 03/01/2019 12:48 PM   Phlebitis Assessment 0 03/01/2019 12:48 PM   Infiltration Assessment 0 03/01/2019 12:48 PM   Dressing Status Clean, dry, & intact 03/01/2019 12:48 PM        Opportunity for questions and clarification was provided.      Patient transported with:

## 2019-03-01 NOTE — Progress Notes (Signed)
Pt c/o 5 out 10 pain in abdomen. Pt describes as sore aching. Hospitalist made aware. Pt also reports that he fell at hotel and might have hit head. Pt reports head hurting as well. Will make hospitalist aware.

## 2019-03-01 NOTE — ED Provider Notes (Signed)
Here with initial complaint of chest discomfort but on further questioning he has actually globally been sick for several days if not longer.  He has no energy to even simple activity he has loss of appetite.  He has some cough that is been non-productive.  He feels as though he probably has had some fever during this time.  Technically not homeless states he is living in a hotel.  He has concerns and worries that he may have been exposed to HIV he asked to explain this he states that his wife from whom he is somewhat estranged has gone from being fairly stout to cachectic and he thinks that she may have an illness he does not know that she has any specific 1 though.  Denies any substance of abuse.    The history is provided by the patient.   Chest Pain (Angina)    Pertinent negatives include no abdominal pain, no diaphoresis, no exertional chest pressure, no nausea, no shortness of breath and no vomiting. Risk factors include cardiac disease. His past medical history does not include DVT or PE.        Past Medical History:   Diagnosis Date   ??? CAD (coronary artery disease)    ??? COPD    ??? Gastrointestinal disorder     acid reflux and ulcers   ??? Hypertension    ??? Seizures ([place])        Past Surgical History:   Procedure Laterality Date   ??? CARDIAC SURG PROCEDURE UNLIST      2 stents         No family history on file.    Social History     Socioeconomic History   ??? Marital status: MARRIED     Spouse name: Not on file   ??? Number of children: Not on file   ??? Years of education: Not on file   ??? Highest education level: Not on file   Occupational History   ??? Not on file   Social Needs   ??? Financial resource strain: Not on file   ??? Food insecurity:     Worry: Not on file     Inability: Not on file   ??? Transportation needs:     Medical: Not on file     Non-medical: Not on file   Tobacco Use   ??? Smoking status: Current Every Day Smoker     Packs/day: 2.00   Substance and Sexual Activity   ??? Alcohol use: No   ??? Drug use: Yes      Types: Cocaine   ??? Sexual activity: Not on file   Lifestyle   ??? Physical activity:     Days per week: Not on file     Minutes per session: Not on file   ??? Stress: Not on file   Relationships   ??? Social connections:     Talks on phone: Not on file     Gets together: Not on file     Attends religious service: Not on file     Active member of club or organization: Not on file     Attends meetings of clubs or organizations: Not on file     Relationship status: Not on file   ??? Intimate partner violence:     Fear of current or ex partner: Not on file     Emotionally abused: Not on file     Physically abused: Not on file     Forced  sexual activity: Not on file   Other Topics Concern   ??? Not on file   Social History Narrative   ??? Not on file         ALLERGIES: Patient has no known allergies.    Review of Systems   Constitutional: Positive for fatigue. Negative for chills and diaphoresis.   HENT: Negative.    Respiratory: Negative for shortness of breath.    Cardiovascular: Positive for chest pain.   Gastrointestinal: Negative for abdominal pain, anal bleeding, blood in stool, constipation, nausea and vomiting.   Neurological: Negative.    Hematological: Does not bruise/bleed easily.   Psychiatric/Behavioral: Positive for dysphoric mood.   All other systems reviewed and are negative.      Vitals:    03/01/19 0811   BP: 115/61   Pulse: 76   Resp: 16   Temp: 98.1 ??F (36.7 ??C)   SpO2: 96%   Weight: 86.2 kg (190 lb)   Height: 5' 10" (1.778 m)            Physical Exam  Vitals signs and nursing note reviewed.   Constitutional:       General: He is not in acute distress.     Appearance: He is well-developed.   HENT:      Head: Atraumatic.      Right Ear: External ear normal.      Left Ear: External ear normal.      Mouth/Throat:      Mouth: Mucous membranes are dry.   Eyes:      General: No scleral icterus.     Comments: Scleral icterus   Neck:      Musculoskeletal: Neck supple. No neck rigidity or muscular tenderness.    Cardiovascular:      Rate and Rhythm: Normal rate.      Heart sounds: Normal heart sounds. No murmur. No friction rub.   Pulmonary:      Effort: Pulmonary effort is normal. No respiratory distress.      Breath sounds: Normal breath sounds.   Abdominal:      General: Abdomen is flat. There is no distension.      Tenderness: There is no abdominal tenderness. There is no right CVA tenderness, left CVA tenderness, guarding or rebound.   Musculoskeletal: Normal range of motion.   Lymphadenopathy:      Cervical: No cervical adenopathy.   Skin:     General: Skin is warm and dry.   Neurological:      General: No focal deficit present.   Psychiatric:         Attention and Perception: Attention normal.         Mood and Affect: Mood is depressed.         Behavior: Behavior normal.         Thought Content: Thought content normal.          MDM  Number of Diagnoses or Management Options  Abnormal liver function tests:   Jaundice:   Pancytopenia [facility]):   Diagnosis management comments: Patient with progressive malaise.  Has disinterest in food he lab work has abnormal liver function test worrisome for either infectious or malignant or other type of etiology.  He is not overtly confused he has concerns of wasting of his wife as far as undiagnosed weight loss and this increases potential of contagion source.  He specifically is worried of HIV but he is unaware of her having any of these.  Certainly could include  hepatitis and other sources for his problems.  He definitely needs ongoing work-up.  Though certainly needs confirmation of this.  We will actively hydrate.  Will cover infectious based off of potential abdominal source pending further testing he will additionally need probably ultrasounds and/or CTs of the chest and abdomen       Amount and/or Complexity of Data Reviewed  Clinical lab tests: ordered and reviewed  Tests in the radiology section of CPT??: reviewed and ordered   Decide to obtain previous medical records or to obtain history from someone other than the patient: yes  Discuss the patient with other providers: yes  Independent visualization of images, tracings, or specimens: yes    Risk of Complications, Morbidity, and/or Mortality  Diagnostic procedures: low  Management options: moderate    Critical Care  Total time providing critical care: 30-74 minutes (CRITICAL CARE Documentation:   This patient is critically ill and there is a high probability of of imminent or life threatening deterioration in the patient's condition without immediate management.    The nature of the patient's clinical problem is: Elevated lactic acid/significantly abnormal liver function tests/pancytopenia    I have spent 50 minutes in direct patient care, documentation, review of labs/xrays/old records, discussion with Staff, hospitalist.     The time involved in the performance of separately reportable procedures was not counted toward critical care time.     Syliva Overman, MD; 03/02/2019 _0 :43 AM    )         Procedures      XR CHEST PORT   Final Result   IMPRESSION: Mild pulmonary hypoinflation. Otherwise no acute infiltrate   suggested.                 Recent Results (from the past 12 hour(s))   EKG, 12 LEAD, INITIAL    Collection Time: 03/01/19  8:07 AM   Result Value Ref Range    Ventricular Rate 78 BPM    Atrial Rate 78 BPM    P-R Interval 154 ms    QRS Duration 100 ms    Q-T Interval 388 ms    QTC Calculation (Bezet) 442 ms    Calculated P Axis 75 degrees    Calculated R Axis -13 degrees    Calculated T Axis 40 degrees    Diagnosis       !! AGE AND GENDER SPECIFIC ECG ANALYSIS !!  Sinus rhythm with Premature supraventricular complexes and Premature   ventricular complexes or Fusion complexes  Otherwise normal ECG  When compared with ECG of 08-Dec-2010 00:24,  Fusion complexes are now Present  Premature ventricular complexes are now Present   Premature supraventricular complexes are now Present     TROPONIN I    Collection Time: 03/01/19  8:11 AM   Result Value Ref Range    Troponin-I, Qt. <0.02 (L) 0.02 - 0.05 NG/ML   CBC WITH AUTOMATED DIFF    Collection Time: 03/01/19  8:11 AM   Result Value Ref Range    WBC 4.3 4.3 - 11.1 K/uL    RBC 3.50 (L) 4.23 - 5.6 M/uL    HGB 10.6 (L) 13.6 - 17.2 g/dL    HCT 32.1 (L) 41.1 - 50.3 %    MCV 91.7 79.6 - 97.8 FL    MCH 30.3 26.1 - 32.9 PG    MCHC 33.0 31.4 - 35.0 g/dL    RDW 14.6 11.9 - 14.6 %    PLATELET 148 (L) 150 -  450 K/uL    MPV 12.2 9.4 - 12.3 FL    ABSOLUTE NRBC 0.00 0.0 - 0.2 K/uL    NEUTROPHILS 47 43 - 78 %    LYMPHOCYTES 35 13 - 44 %    MONOCYTES 14 (H) 4.0 - 12.0 %    EOSINOPHILS 2 0.5 - 7.8 %    BASOPHILS 1 0.0 - 2.0 %    IMMATURE GRANULOCYTES 1 0.0 - 5.0 %    ABS. NEUTROPHILS 2.1 1.7 - 8.2 K/UL    ABS. LYMPHOCYTES 1.5 0.5 - 4.6 K/UL    ABS. MONOCYTES 0.6 0.1 - 1.3 K/UL    ABS. EOSINOPHILS 0.1 0.0 - 0.8 K/UL    ABS. BASOPHILS 0.0 0.0 - 0.2 K/UL    ABS. IMM. GRANS. 0.0 0.0 - 0.5 K/UL    DF AUTOMATED     METABOLIC PANEL, COMPREHENSIVE    Collection Time: 03/01/19  8:11 AM   Result Value Ref Range    Sodium 134 (L) 136 - 145 mmol/L    Potassium 3.4 (L) 3.5 - 5.1 mmol/L    Chloride 103 98 - 107 mmol/L    CO2 24 21 - 32 mmol/L    Anion gap 7 7 - 16 mmol/L    Glucose 111 (H) 65 - 100 mg/dL    BUN 20 6 - 23 MG/DL    Creatinine 0.94 0.8 - 1.5 MG/DL    GFR est AA >60 >60 ml/min/1.62m    GFR est non-AA >60 >60 ml/min/1.714m   Calcium 7.7 (L) 8.3 - 10.4 MG/DL    Bilirubin, total 4.9 (H) 0.2 - 1.1 MG/DL    ALT (SGPT) 964 (H) 12 - 65 U/L    AST (SGOT) 928 (H) 15 - 37 U/L    Alk. phosphatase 178 (H) 50 - 136 U/L    Protein, total 6.4 6.3 - 8.2 g/dL    Albumin 2.1 (L) 3.5 - 5.0 g/dL    Globulin 4.3 (H) 2.3 - 3.5 g/dL    A-G Ratio 0.5 (L) 1.2 - 3.5     PROCALCITONIN    Collection Time: 03/01/19  8:11 AM   Result Value Ref Range    Procalcitonin 3.44 ng/mL   INFLUENZA A & B AG (RAPID TEST)     Collection Time: 03/01/19  9:18 AM   Result Value Ref Range    Influenza A Ag NEGATIVE  NEG      Influenza B Ag NEGATIVE  NEG      Source Nasopharyngeal

## 2019-03-01 NOTE — Progress Notes (Signed)
Pt called hotel he was at and ask hotel attendant about when he fell. Pt reports that the attendant witnessed him fall into chair and pt reportedly did not hit his head. Pt does not want to complete CT scan of head.   CT scan to be cancelled.

## 2019-03-01 NOTE — ED Notes (Signed)
Hospitalist at bedside for admission evaluation

## 2019-03-01 NOTE — Progress Notes (Signed)
Pt c/o 5 out 10 pain in abdomen. Pt describes as sore aching. Hospitalist made aware. Pt also reports that he fell at hotel and might have hit head. Pt reports head hurting as well. Will make hospitalist aware.

## 2019-03-01 NOTE — ED Notes (Signed)
Patient arrived via EMS from hotel lobby in [place].  EMS called due to cp that began 1 hour ago.  Patient c/o mid sternal localized cp that is 8/10 on numeric pain scale.  Patient states he thinks its from stress.  EMS reports that patient states cp began when he found out his ex-wife is HIV positive.  EMS stats patient is homeless.  A+O x4 with EMS.  BP 129/73, HR 90bpm NSR, afebrile, O2 sat 98% on RA.  EMs placed 18g left FA.  EMS administered NS bolus, Nitro SL tabs x2, and ASA 324.

## 2019-03-01 NOTE — H&P (Signed)
H&P by Helmut Muster, MD at  03/01/19 1225                Author: Helmut Muster, MD  Service: Internal Medicine  Author Type: Physician       Filed: 03/01/19 1458  Date of Service: 03/01/19 1225  Status: Addendum          Editor: Helmut Muster, MD (Physician)          Related Notes: Original Note by Helmut Muster, MD (Physician) filed at 03/01/19 1445                         Hospitalist Note        Admit Date:  03/01/2019  8:04 AM    Name:  Vincent Smith    Age:  58 y.o.   DOB:  1961/11/14    MRN:  [id number]    PCP:  Lacey Jensen, MD   Treatment Team: Attending Provider: Helmut Muster, MD; Primary Nurse: Santina Evans, RN        HPI/Subjective:        Mr. 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    Patient states "I feel like crap" for past week. He feels tired and lethargic. He has decreased appetite and has not eaten in a few days. He also complains of diffused dull abdominal pain that is not related to eating.  Denies any chills but feels like  he has had fever. Unable to tell if has any night sweats or weight loss.  He denies any IV drug use. He has used marijuana in the past. Smoked for about a year 123yrago.    Denies any recent travel, sick contact, prior liver disease, family hx of liver diseases. Denies EtOH use.     He states he is concern about HIV exposure as he was recently told that his estranged wife has HIV and "she does not look good".    He also complains of increased urinary frequency without any hesitancy, dribbling or dysuria.    Reports not being on any daily medications at home.       Patient was seen at [facility] on 12/28/2018 for chest pain.  CT abdomen pelvis done at that time showed cirrhotic liver, splenomegaly and cholelithiasis within the gallbladder without intrahepatic or extrahepatic biliary ductal dilation.  LFTs at  that time were within normal.      In the ED, labs are notable for  elevated liver enzymes (ALT 964, AST 928, ALP 178, total bili 4.9) , lactic acid of 4.4 and procalc of 3.44.  Patient appeared ill with jaundice and sclera icterus.  INR is 1.4. CXR   10 systems reviewed and negative except as noted in HPI.        Past Medical History:        Diagnosis  Date         ?  CAD (coronary artery disease)       ?  COPD       ?  Gastrointestinal disorder            acid reflux and ulcers         ?  Hypertension           ?  Seizures ([facility]            Past  Surgical History:         Procedure  Laterality  Date          ?  CARDIAC SURG PROCEDURE UNLIST              2 stents         No Known Allergies      Social History          Tobacco Use         ?  Smoking status:  Former Smoker              Packs/day:  2.00       Substance Use Topics         ?  Alcohol use:  No           Family History         Problem  Relation  Age of Onset          ?  Hypertension  Mother       ?  Hypertension  Father            ?  Diabetes  Father              There is no immunization history on file for this patient.   PTA Medications:     Prior to Admission Medications     Prescriptions  Last Dose  Informant  Patient Reported?  Taking?      HYDROcodone-acetaminophen (NORCO) 10-325 mg tablet      Yes  No      Sig: Take 1 Tab by mouth as needed.      IPRATROPIUM/ALBUTEROL SULFATE (COMBIVENT IN)      Yes  No      Sig: Take  by inhalation two (2) times a day.      OTHER      Yes  No      Sig: daily. BP med  / unknown name or dose       OTHER      Yes  No      Sig: daily. Cholesterol med / unknown name or dose       clopidogrel (PLAVIX) 75 mg tablet      Yes  No      Sig: Take 75 mg by mouth daily.      nitroglycerin (NITROSTAT) 0.4 mg SL tablet      Yes  No      Sig: 0.4 mg by SubLINGual route every five (5) minutes as needed.               Facility-Administered Medications: None             Objective:        Patient Vitals for the past 24 hrs:            Temp  Pulse  Resp  BP  SpO2            03/01/19 1240  --  75  20   116/73  98 %            03/01/19 0811  98.1 ??F (36.7 ??C)  76  16  115/61  96 %        Oxygen Therapy   O2 Sat (%): 98 % (03/01/19 1240)   Pulse via Oximetry: 76 beats per minute (03/01/19 1240)   O2 Device: Room air (03/01/19 0811)      Estimated body mass index is 27.26 kg/m?? as  calculated from the following:     Height as of this encounter: '5\' 10"'  (1.778 m).     Weight as of this encounter: 86.2 kg (190 lb).      No intake or output data in the 24 hours ending 03/01/19 1303     *Note that automatically entered I/Os may not be accurate; dependent on patient compliance with collection and accurate data entry by assistants.         Physical Exam:   General:     alert, awake, jaundice, ill appearing   Head:   normocephalic, atraumatic   Eyes, Ears, nose: PERRL, EOMI. Sclera icterus   Neck:    supple, non-tender    Lungs:   Crackles on the bases without any wheezing.   Cardiac:   RRR, Normal S1 and S2.     Abdomen:   Soft, slightly distended, TTP diffusely without guarding   Extremities:   Warm, dry. No edema    Skin:   No rashes, no jaundice   Neuro:  Alert and oriented to person, time, place, situation. No gross focal neurological deficit   Psychiatric:  No anxiety, calm, cooperative         Data Review:      Recent Results (from the past 24 hour(s))     EKG, 12 LEAD, INITIAL          Collection Time: 03/01/19  8:07 AM         Result  Value  Ref Range            Ventricular Rate  78  BPM       Atrial Rate  78  BPM       P-R Interval  154  ms       QRS Duration  100  ms       Q-T Interval  388  ms       QTC Calculation (Bezet)  442  ms       Calculated P Axis  75  degrees       Calculated R Axis  -13  degrees       Calculated T Axis  40  degrees       Diagnosis                    Sinus rhythm with Premature supraventricular complexes and Premature    ventricular complexes or Fusion complexes   Otherwise normal ECG   When compared with ECG of 08-Dec-2010 00:24,   Premature ventricular complexes are now Present    Premature supraventricular complexes are now Present   Confirmed by Driscilla Grammes ([id number]) on 03/01/2019 12:41:26 PM          TROPONIN I          Collection Time: 03/01/19  8:11 AM         Result  Value  Ref Range            Troponin-I, Qt.  <0.02 (L)  0.02 - 0.05 NG/ML       CBC WITH AUTOMATED DIFF          Collection Time: 03/01/19  8:11 AM         Result  Value  Ref Range            WBC  4.3  4.3 - 11.1 K/uL       RBC  3.50 (L)  4.23 - 5.6 M/uL       HGB  10.6 (L)  13.6 - 17.2 g/dL       HCT  32.1 (L)  41.1 - 50.3 %       MCV  91.7  79.6 - 97.8 FL       MCH  30.3  26.1 - 32.9 PG       MCHC  33.0  31.4 - 35.0 g/dL       RDW  14.6  11.9 - 14.6 %       PLATELET  148 (L)  150 - 450 K/uL       MPV  12.2  9.4 - 12.3 FL       ABSOLUTE NRBC  0.00  0.0 - 0.2 K/uL       NEUTROPHILS  47  43 - 78 %       LYMPHOCYTES  35  13 - 44 %       MONOCYTES  14 (H)  4.0 - 12.0 %       EOSINOPHILS  2  0.5 - 7.8 %       BASOPHILS  1  0.0 - 2.0 %       IMMATURE GRANULOCYTES  1  0.0 - 5.0 %       ABS. NEUTROPHILS  2.1  1.7 - 8.2 K/UL       ABS. LYMPHOCYTES  1.5  0.5 - 4.6 K/UL       ABS. MONOCYTES  0.6  0.1 - 1.3 K/UL       ABS. EOSINOPHILS  0.1  0.0 - 0.8 K/UL       ABS. BASOPHILS  0.0  0.0 - 0.2 K/UL       ABS. IMM. GRANS.  0.0  0.0 - 0.5 K/UL       DF  AUTOMATED          METABOLIC PANEL, COMPREHENSIVE          Collection Time: 03/01/19  8:11 AM         Result  Value  Ref Range            Sodium  134 (L)  136 - 145 mmol/L       Potassium  3.4 (L)  3.5 - 5.1 mmol/L       Chloride  103  98 - 107 mmol/L       CO2  24  21 - 32 mmol/L       Anion gap  7  7 - 16 mmol/L       Glucose  111 (H)  65 - 100 mg/dL       BUN  20  6 - 23 MG/DL       Creatinine  0.94  0.8 - 1.5 MG/DL       GFR est AA  >60  >60 ml/min/1.30m       GFR est non-AA  >60  >60 ml/min/1.758m      Calcium  7.7 (L)  8.3 - 10.4 MG/DL       Bilirubin, total  4.9 (H)  0.2 - 1.1 MG/DL       ALT (SGPT)  964 (H)  12 - 65 U/L       AST (SGOT)  928 (H)  15 - 37 U/L       Alk. phosphatase   178 (H)  50 - 136 U/L       Protein, total  6.4  6.3 - 8.2 g/dL       Albumin  2.1 (L)  3.5 - 5.0 g/dL       Globulin  4.3 (H)  2.3 - 3.5 g/dL       A-G Ratio  0.5 (L)  1.2 - 3.5         PROCALCITONIN          Collection Time: 03/01/19  8:11 AM         Result  Value  Ref Range            Procalcitonin  3.44  ng/mL       PROTHROMBIN TIME + INR          Collection Time: 03/01/19  8:11 AM         Result  Value  Ref Range            Prothrombin time  18.0 (H)  12.0 - 14.7 sec       INR  1.4          INFLUENZA A & B AG (RAPID TEST)          Collection Time: 03/01/19  9:18 AM         Result  Value  Ref Range            Influenza A Ag  NEGATIVE   NEG         Influenza B Ag  NEGATIVE   NEG         Source  Nasopharyngeal          LACTIC ACID          Collection Time: 03/01/19 10:31 AM         Result  Value  Ref Range            Lactic acid  4.4 (HH)  0.4 - 2.0 MMOL/L             All Micro Results               Procedure  Component  Value  Units  Date/Time           CULTURE, BLOOD [[id number]]  Collected:  03/01/19 1235            Order Status:  Completed  Specimen:  Blood  Updated:  03/01/19 1301           CULTURE, BLOOD [[id number]]  Collected:  03/01/19 1235            Order Status:  Completed  Specimen:  Blood  Updated:  03/01/19 1301           INFLUENZA A & B AG (RAPID TEST) [[id number]]  Collected:  03/01/19 0918            Order Status:  Completed  Specimen:  Nasopharyngeal from Nasal washing  Updated:  03/01/19 0939                Influenza A Ag  NEGATIVE                   Comment:  NEGATIVE FOR THE PRESENCE OF INFLUENZA A ANTIGEN   INFECTION DUE TO INFLUENZA A CANNOT BE RULED OUT.   BECAUSE THE ANTIGEN PRESENT IN THE SAMPLE MAY BE BELOW   THE DETECTION LIMIT OF THE TEST.   A NEGATIVE TEST IS PRESUMPTIVE AND IT IS RECOMMENDED THAT THESE RESULTS BE CONFIRMED BY VIRAL CULTURE OR AN FDA-CLEARED INFLUENZA A AND B MOLECULAR ASSAY.  Influenza B Ag  NEGATIVE                   Comment:  NEGATIVE FOR  THE PRESENCE OF INFLUENZA B ANTIGEN   INFECTION DUE TO INFLUENZA B CANNOT BE RULED OUT.   BECAUSE THE ANTIGEN PRESENT IN THE SAMPLE MAY BE BELOW   THE DETECTION LIMIT OF THE TEST.   A NEGATIVE TEST IS PRESUMPTIVE AND IT IS RECOMMENDED THAT THESE RESULTS BE CONFIRMED BY VIRAL CULTURE OR AN FDA-CLEARED INFLUENZA A AND B MOLECULAR ASSAY.                             Source  Nasopharyngeal                       Current Facility-Administered Medications          Medication  Dose  Route  Frequency           ?  lactated ringers bolus infusion 1,000 mL   1,000 mL  IntraVENous  ONCE     ?  cefTRIAXone (ROCEPHIN) 1 g in 0.9% sodium chloride (MBP/ADV) 50 mL   1 g  IntraVENous  NOW     ?  metroNIDAZOLE (FLAGYL) IVPB premix 500 mg   500 mg  IntraVENous  NOW     ?  sodium chloride (NS) flush 5-40 mL   5-40 mL  IntraVENous  Q8H     ?  sodium chloride (NS) flush 5-40 mL   5-40 mL  IntraVENous  PRN     ?  diphenhydrAMINE (BENADRYL) capsule 25 mg   25 mg  Oral  Q4H PRN     ?  ondansetron (ZOFRAN) injection 4 mg   4 mg  IntraVENous  Q4H PRN     ?  senna-docusate (PERICOLACE) 8.6-50 mg per tablet 2 Tab   2 Tab  Oral  DAILY PRN           ?  heparin (porcine) injection 5,000 Units   5,000 Units  SubCUTAneous  Q8H          Current Outpatient Medications        Medication  Sig         ?  clopidogrel (PLAVIX) 75 mg tablet  Take 75 mg by mouth daily.     ?  nitroglycerin (NITROSTAT) 0.4 mg SL tablet  0.4 mg by SubLINGual route every five (5) minutes as needed.     ?  IPRATROPIUM/ALBUTEROL SULFATE (COMBIVENT IN)  Take  by inhalation two (2) times a day.     ?  HYDROcodone-acetaminophen (NORCO) 10-325 mg tablet  Take 1 Tab by mouth as needed.     ?  OTHER  daily. BP med  / unknown name or dose          ?  OTHER  daily. Cholesterol med / unknown name or dose            Other Studies:   Xr Chest Port      Result Date: 03/01/2019   1 View portable chest x-ray 03/01/2019 8:32 AM Indication: Chest pain Comparison: 12/08/2010 Findings: This portable  upright AP chest of 0809 shows the monitoring leads overlying the chest. Lung volumes are low, only slight crowding though suggested at  the bases. Cardiomediastinal silhouette within normal limits. No confluent infiltrate, no significant effusion. Vascularity seems appropriate.       IMPRESSION:  Mild pulmonary hypoinflation. Otherwise no acute infiltrate suggested.            Assessment and Plan:           Hospital Problems  as of 03/01/2019  Never Reviewed                         Codes  Class  Noted - Resolved  POA              Jaundice  ICD-10-CM: R17   ICD-9-CM: 782.4    03/01/2019 - Present  Unknown                        Elevated liver enzymes  ICD-10-CM: R74.8   ICD-9-CM: 790.5    03/01/2019 - Present  Unknown                          Plan:   Mr. Sanfilippo is a 58 year old male with past medical history of MI (3-51yr ago) s/p 2 stents, COPD who presented to ED with many vague complaints.       Abdominal Pain, Jaundice, Hepatocellular pattern of elevated Liver Enzymes    Cholecystitis due to cholelithiasis   - ALT 964, AST 928, ALP 178, total bili 4.9. Albumin 2.1 with normal INR and platelet.    - no leukocytosis or fever   - ultrasound Abd : Cholelithiasis within the gallbladder with wall thickening.  No biliary tree obstruction.   - hepatitis panel, HIV, drug screen, tylenol level, urine analysis   - zosyn (3/7)   - GI consult         Lactic acidosis   - elevated procalc, fatigue with subjected fevers at home. abdominal exam is not impressive for bowel ischemia   - Afrebril, no leukocytosis on arrival.  CXR showed no acute infectious process. UA is neg   - s/p ceftriaxone and flagyl by ED   - f/u BCx   - zosyn as above   - repeat Lactic acid      Normocytic Anemia   - iron, ferritin, TIBC    - stool occult      MI s/p stents (3-416yrago)   - not on any meds      COPD   - not on any meds   - nebs prn   - o2 supplement as needed      Diet:  DIET REGULAR   DVT PPx: heparin sq   Code: Full Code   Estimated LOS:  Greater  than 2 midnights        Medical Decision Making:      Labs/Imaging reviewed.   Additional information obtained from ED provider.   Patient is high risk due to medical condition and comorbidities. In addition, requires monitoring due to receiving medications.   Plan discussed with nursing staff.   Plan discussed with patient/family.  All questions/concerns were addressed. Pt/family agrees with the plan.          Signed:   BaHelmut MusterMD

## 2019-03-01 NOTE — Progress Notes (Signed)
Pt found in bathroom. IV fluid running on floor. Pt reports he vomited. zofran IVP 4mg  administered.   Stool sample in hat collected and sent to lab for occult stool study.   Pt returned to bed. Pt c/o of head hurting but refuses CT scan of head. Asking for next dose of pain medication.

## 2019-03-01 NOTE — Progress Notes (Unsigned)
03/01/19 1439   Dual Skin Pressure Injury Assessment   Dual Skin Pressure Injury Assessment WDL   Second Care Provider (Based on Facility Policy) Inetta Fermo, RN     Pt A&Ox4. pt has no skin breakdown. Pt appears jaundiced. Call light placed within reach. Bed low and locked.

## 2019-03-01 NOTE — ED Provider Notes (Signed)
Here with initial complaint of chest discomfort but on further questioning he has actually globally been sick for several days if not longer.  He has no energy to even simple activity he has loss of appetite.  He has some cough that is been non-productive.  He feels as though he probably has had some fever during this time.  Technically not homeless states he is living in a hotel.  He has concerns and worries that he may have been exposed to HIV he asked to explain this he states that his wife from whom he is somewhat estranged has gone from being fairly stout to cachectic and he thinks that she may have an illness he does not know that she has any specific 1 though.  Denies any substance of abuse.    The history is provided by the patient.   Chest Pain (Angina)    Pertinent negatives include no abdominal pain, no diaphoresis, no exertional chest pressure, no nausea, no shortness of breath and no vomiting. Risk factors include cardiac disease. His past medical history does not include DVT or PE.        Past Medical History:   Diagnosis Date   ??? CAD (coronary artery disease)    ??? COPD    ??? Gastrointestinal disorder     acid reflux and ulcers   ??? Hypertension    ??? Seizures ([place])        Past Surgical History:   Procedure Laterality Date   ??? CARDIAC SURG PROCEDURE UNLIST      2 stents         No family history on file.    Social History     Socioeconomic History   ??? Marital status: MARRIED     Spouse name: Not on file   ??? Number of children: Not on file   ??? Years of education: Not on file   ??? Highest education level: Not on file   Occupational History   ??? Not on file   Social Needs   ??? Financial resource strain: Not on file   ??? Food insecurity:     Worry: Not on file     Inability: Not on file   ??? Transportation needs:     Medical: Not on file     Non-medical: Not on file   Tobacco Use   ??? Smoking status: Current Every Day Smoker     Packs/day: 2.00   Substance and Sexual Activity   ??? Alcohol use: No   ??? Drug use: Yes      Types: Cocaine   ??? Sexual activity: Not on file   Lifestyle   ??? Physical activity:     Days per week: Not on file     Minutes per session: Not on file   ??? Stress: Not on file   Relationships   ??? Social connections:     Talks on phone: Not on file     Gets together: Not on file     Attends religious service: Not on file     Active member of club or organization: Not on file     Attends meetings of clubs or organizations: Not on file     Relationship status: Not on file   ??? Intimate partner violence:     Fear of current or ex partner: Not on file     Emotionally abused: Not on file     Physically abused: Not on file     Forced  sexual activity: Not on file   Other Topics Concern   ??? Not on file   Social History Narrative   ??? Not on file         ALLERGIES: Patient has no known allergies.    Review of Systems   Constitutional: Positive for fatigue. Negative for chills and diaphoresis.   HENT: Negative.    Respiratory: Negative for shortness of breath.    Cardiovascular: Positive for chest pain.   Gastrointestinal: Negative for abdominal pain, anal bleeding, blood in stool, constipation, nausea and vomiting.   Neurological: Negative.    Hematological: Does not bruise/bleed easily.   Psychiatric/Behavioral: Positive for dysphoric mood.   All other systems reviewed and are negative.      Vitals:    03/01/19 0811   BP: 115/61   Pulse: 76   Resp: 16   Temp: 98.1 ??F (36.7 ??C)   SpO2: 96%   Weight: 86.2 kg (190 lb)   Height: 5' 10" (1.778 m)            Physical Exam  Vitals signs and nursing note reviewed.   Constitutional:       General: He is not in acute distress.     Appearance: He is well-developed.   HENT:      Head: Atraumatic.      Right Ear: External ear normal.      Left Ear: External ear normal.      Mouth/Throat:      Mouth: Mucous membranes are dry.   Eyes:      General: No scleral icterus.     Comments: Scleral icterus   Neck:      Musculoskeletal: Neck supple. No neck rigidity or muscular tenderness.    Cardiovascular:      Rate and Rhythm: Normal rate.      Heart sounds: Normal heart sounds. No murmur. No friction rub.   Pulmonary:      Effort: Pulmonary effort is normal. No respiratory distress.      Breath sounds: Normal breath sounds.   Abdominal:      General: Abdomen is flat. There is no distension.      Tenderness: There is no abdominal tenderness. There is no right CVA tenderness, left CVA tenderness, guarding or rebound.   Musculoskeletal: Normal range of motion.   Lymphadenopathy:      Cervical: No cervical adenopathy.   Skin:     General: Skin is warm and dry.   Neurological:      General: No focal deficit present.   Psychiatric:         Attention and Perception: Attention normal.         Mood and Affect: Mood is depressed.         Behavior: Behavior normal.         Thought Content: Thought content normal.          MDM  Number of Diagnoses or Management Options  Abnormal liver function tests:   Jaundice:   Pancytopenia [facility]):   Diagnosis management comments: Patient with progressive malaise.  Has disinterest in food he lab work has abnormal liver function test worrisome for either infectious or malignant or other type of etiology.  He is not overtly confused he has concerns of wasting of his wife as far as undiagnosed weight loss and this increases potential of contagion source.  He specifically is worried of HIV but he is unaware of her having any of these.  Certainly could include  hepatitis and other sources for his problems.  He definitely needs ongoing work-up.  Though certainly needs confirmation of this.  We will actively hydrate.  Will cover infectious based off of potential abdominal source pending further testing he will additionally need probably ultrasounds and/or CTs of the chest and abdomen       Amount and/or Complexity of Data Reviewed  Clinical lab tests: ordered and reviewed  Tests in the radiology section of CPT??: reviewed and ordered  Decide to obtain previous medical records or to  obtain history from someone other than the patient: yes  Discuss the patient with other providers: yes  Independent visualization of images, tracings, or specimens: yes    Risk of Complications, Morbidity, and/or Mortality  Diagnostic procedures: low  Management options: moderate    Critical Care  Total time providing critical care: 30-74 minutes (CRITICAL CARE Documentation:   This patient is critically ill and there is a high probability of of imminent or life threatening deterioration in the patient's condition without immediate management.    The nature of the patient's clinical problem is: Elevated lactic acid/significantly abnormal liver function tests/pancytopenia    I have spent 50 minutes in direct patient care, documentation, review of labs/xrays/old records, discussion with Staff, hospitalist.     The time involved in the performance of separately reportable procedures was not counted toward critical care time.     Syliva Overman, MD; 03/02/2019 '@10'$ :43 AM    )         Procedures      XR CHEST PORT   Final Result   IMPRESSION: Mild pulmonary hypoinflation. Otherwise no acute infiltrate   suggested.                 Recent Results (from the past 12 hour(s))   EKG, 12 LEAD, INITIAL    Collection Time: 03/01/19  8:07 AM   Result Value Ref Range    Ventricular Rate 78 BPM    Atrial Rate 78 BPM    P-R Interval 154 ms    QRS Duration 100 ms    Q-T Interval 388 ms    QTC Calculation (Bezet) 442 ms    Calculated P Axis 75 degrees    Calculated R Axis -13 degrees    Calculated T Axis 40 degrees    Diagnosis       !! AGE AND GENDER SPECIFIC ECG ANALYSIS !!  Sinus rhythm with Premature supraventricular complexes and Premature   ventricular complexes or Fusion complexes  Otherwise normal ECG  When compared with ECG of 08-Dec-2010 00:24,  Fusion complexes are now Present  Premature ventricular complexes are now Present  Premature supraventricular complexes are now Present     TROPONIN I    Collection Time: 03/01/19   8:11 AM   Result Value Ref Range    Troponin-I, Qt. <0.02 (L) 0.02 - 0.05 NG/ML   CBC WITH AUTOMATED DIFF    Collection Time: 03/01/19  8:11 AM   Result Value Ref Range    WBC 4.3 4.3 - 11.1 K/uL    RBC 3.50 (L) 4.23 - 5.6 M/uL    HGB 10.6 (L) 13.6 - 17.2 g/dL    HCT 32.1 (L) 41.1 - 50.3 %    MCV 91.7 79.6 - 97.8 FL    MCH 30.3 26.1 - 32.9 PG    MCHC 33.0 31.4 - 35.0 g/dL    RDW 14.6 11.9 - 14.6 %    PLATELET 148 (L) 150 -  450 K/uL    MPV 12.2 9.4 - 12.3 FL    ABSOLUTE NRBC 0.00 0.0 - 0.2 K/uL    NEUTROPHILS 47 43 - 78 %    LYMPHOCYTES 35 13 - 44 %    MONOCYTES 14 (H) 4.0 - 12.0 %    EOSINOPHILS 2 0.5 - 7.8 %    BASOPHILS 1 0.0 - 2.0 %    IMMATURE GRANULOCYTES 1 0.0 - 5.0 %    ABS. NEUTROPHILS 2.1 1.7 - 8.2 K/UL    ABS. LYMPHOCYTES 1.5 0.5 - 4.6 K/UL    ABS. MONOCYTES 0.6 0.1 - 1.3 K/UL    ABS. EOSINOPHILS 0.1 0.0 - 0.8 K/UL    ABS. BASOPHILS 0.0 0.0 - 0.2 K/UL    ABS. IMM. GRANS. 0.0 0.0 - 0.5 K/UL    DF AUTOMATED     METABOLIC PANEL, COMPREHENSIVE    Collection Time: 03/01/19  8:11 AM   Result Value Ref Range    Sodium 134 (L) 136 - 145 mmol/L    Potassium 3.4 (L) 3.5 - 5.1 mmol/L    Chloride 103 98 - 107 mmol/L    CO2 24 21 - 32 mmol/L    Anion gap 7 7 - 16 mmol/L    Glucose 111 (H) 65 - 100 mg/dL    BUN 20 6 - 23 MG/DL    Creatinine 0.94 0.8 - 1.5 MG/DL    GFR est AA >60 >60 ml/min/1.17m    GFR est non-AA >60 >60 ml/min/1.770m   Calcium 7.7 (L) 8.3 - 10.4 MG/DL    Bilirubin, total 4.9 (H) 0.2 - 1.1 MG/DL    ALT (SGPT) 964 (H) 12 - 65 U/L    AST (SGOT) 928 (H) 15 - 37 U/L    Alk. phosphatase 178 (H) 50 - 136 U/L    Protein, total 6.4 6.3 - 8.2 g/dL    Albumin 2.1 (L) 3.5 - 5.0 g/dL    Globulin 4.3 (H) 2.3 - 3.5 g/dL    A-G Ratio 0.5 (L) 1.2 - 3.5     PROCALCITONIN    Collection Time: 03/01/19  8:11 AM   Result Value Ref Range    Procalcitonin 3.44 ng/mL   INFLUENZA A & B AG (RAPID TEST)    Collection Time: 03/01/19  9:18 AM   Result Value Ref Range    Influenza A Ag NEGATIVE  NEG      Influenza B Ag NEGATIVE   NEG      Source Nasopharyngeal

## 2019-03-01 NOTE — Progress Notes (Signed)
Order received to bolus pt 500 cc NS for lactic acid of 2.6

## 2019-03-02 ENCOUNTER — Inpatient Hospital Stay: Admit: 2019-03-02 | Payer: TRICARE (CHAMPUS) | Primary: Family Medicine

## 2019-03-02 LAB — CBC WITH AUTO DIFFERENTIAL
Basophils %: 0 % (ref 0.0–2.0)
Basophils Absolute: 0 10*3/uL (ref 0.0–0.2)
Eosinophils %: 1 % (ref 0.5–7.8)
Eosinophils Absolute: 0 10*3/uL (ref 0.0–0.8)
Granulocyte Absolute Count: 0 10*3/uL (ref 0.0–0.5)
Hematocrit: 29.5 % — ABNORMAL LOW (ref 41.1–50.3)
Hemoglobin: 9.8 g/dL — ABNORMAL LOW (ref 13.6–17.2)
Immature Granulocytes: 0 % (ref 0.0–5.0)
Lymphocytes %: 43 % (ref 13–44)
Lymphocytes Absolute: 1.2 10*3/uL (ref 0.5–4.6)
MCH: 30.2 PG (ref 26.1–32.9)
MCHC: 33.2 g/dL (ref 31.4–35.0)
MCV: 90.8 FL (ref 79.6–97.8)
MPV: 12.3 FL (ref 9.4–12.3)
Monocytes %: 11 % (ref 4.0–12.0)
Monocytes Absolute: 0.3 10*3/uL (ref 0.1–1.3)
NRBC Absolute: 0 10*3/uL (ref 0.0–0.2)
Neutrophils %: 45 % (ref 43–78)
Neutrophils Absolute: 1.4 10*3/uL — ABNORMAL LOW (ref 1.7–8.2)
Platelet Comment: ADEQUATE
Platelets: 122 10*3/uL — ABNORMAL LOW (ref 150–450)
RBC: 3.25 M/uL — ABNORMAL LOW (ref 4.23–5.6)
RDW: 14.8 % — ABNORMAL HIGH (ref 11.9–14.6)
WBC: 2.9 10*3/uL — ABNORMAL LOW (ref 4.3–11.1)

## 2019-03-02 LAB — HEPATIC FUNCTION PANEL
A-G Ratio: 0.4 — ABNORMAL LOW (ref 1.2–3.5)
ALT (SGPT): 683 U/L — ABNORMAL HIGH (ref 12–65)
ALT: 683 U/L — ABNORMAL HIGH (ref 12–65)
AST (SGOT): 598 U/L — ABNORMAL HIGH (ref 15–37)
AST: 598 U/L — ABNORMAL HIGH (ref 15–37)
Albumin/Globulin Ratio: 0.4 — ABNORMAL LOW (ref 1.2–3.5)
Albumin: 1.7 g/dL — ABNORMAL LOW (ref 3.5–5.0)
Albumin: 1.7 g/dL — ABNORMAL LOW (ref 3.5–5.0)
Alk. phosphatase: 170 U/L — ABNORMAL HIGH (ref 50–136)
Alkaline Phosphatase: 170 U/L — ABNORMAL HIGH (ref 50–136)
Bilirubin, Direct: 3.7 MG/DL — ABNORMAL HIGH (ref <0.4)
Bilirubin, direct: 3.7 MG/DL — ABNORMAL HIGH (ref <0.4)
Bilirubin, total: 4.2 MG/DL — ABNORMAL HIGH (ref 0.2–1.1)
Globulin: 4 g/dL — ABNORMAL HIGH (ref 2.3–3.5)
Globulin: 4 g/dL — ABNORMAL HIGH (ref 2.3–3.5)
Protein, total: 5.7 g/dL — ABNORMAL LOW (ref 6.3–8.2)
Total Bilirubin: 4.2 MG/DL — ABNORMAL HIGH (ref 0.2–1.1)
Total Protein: 5.7 g/dL — ABNORMAL LOW (ref 6.3–8.2)

## 2019-03-02 LAB — BASIC METABOLIC PANEL
Anion Gap: 9 mmol/L (ref 7–16)
BUN: 11 MG/DL (ref 6–23)
CO2: 23 mmol/L (ref 21–32)
Calcium: 7.5 MG/DL — ABNORMAL LOW (ref 8.3–10.4)
Chloride: 107 mmol/L (ref 98–107)
Creatinine: 0.94 MG/DL (ref 0.8–1.5)
EGFR IF NonAfrican American: >60 mL/min/{1.73_m2} (ref >60)
GFR African American: >60 mL/min/{1.73_m2} (ref >60)
Glucose: 104 mg/dL — ABNORMAL HIGH (ref 65–100)
Potassium: 3.7 mmol/L (ref 3.5–5.1)
Sodium: 139 mmol/L (ref 136–145)

## 2019-03-02 LAB — OCCULT BLOOD, FECAL: Occult Blood Fecal: NEGATIVE

## 2019-03-02 LAB — HEPATITIS PANEL, ACUTE
HCV Ab: 0.1 s/co ratio (ref 0.0–0.9)
Hep A IgM: POSITIVE — AB
Hep B Core Ab, IgM: NEGATIVE
Hep B Core Ab, IgM: NEGATIVE
Hep B surface Ag screen: NEGATIVE
Hep C Virus Ab: 0.1 s/co ratio (ref 0.0–0.9)
Hepatitis A Ab, IgM: POSITIVE — AB
Hepatitis B Surface Ag: NEGATIVE

## 2019-03-02 LAB — LACTIC ACID
Lactic Acid: 2.2 MMOL/L (ref 0.4–2.0)
Lactic Acid: 2.5 MMOL/L (ref 0.4–2.0)
Lactic acid: 2.2 MMOL/L — CR (ref 0.4–2.0)
Lactic acid: 2.5 MMOL/L — CR (ref 0.4–2.0)

## 2019-03-02 LAB — AMMONIA
Ammonia, plasma: 22 umol/L (ref 11–32)
Ammonia: 22 umol/L (ref 11–32)

## 2019-03-02 LAB — CBC WITH AUTOMATED DIFF
ABS. BASOPHILS: 0 10*3/uL (ref 0.0–0.2)
ABS. EOSINOPHILS: 0 10*3/uL (ref 0.0–0.8)
ABS. IMM. GRANS.: 0 10*3/uL (ref 0.0–0.5)
ABS. LYMPHOCYTES: 1.2 10*3/uL (ref 0.5–4.6)
ABS. MONOCYTES: 0.3 10*3/uL (ref 0.1–1.3)
ABS. NEUTROPHILS: 1.4 10*3/uL — ABNORMAL LOW (ref 1.7–8.2)
ABSOLUTE NRBC: 0 10*3/uL (ref 0.0–0.2)
BASOPHILS: 0 % (ref 0.0–2.0)
EOSINOPHILS: 1 % (ref 0.5–7.8)
HCT: 29.5 % — ABNORMAL LOW (ref 41.1–50.3)
HGB: 9.8 g/dL — ABNORMAL LOW (ref 13.6–17.2)
IMMATURE GRANULOCYTES: 0 % (ref 0.0–5.0)
LYMPHOCYTES: 43 % (ref 13–44)
MCH: 30.2 PG (ref 26.1–32.9)
MCHC: 33.2 g/dL (ref 31.4–35.0)
MCV: 90.8 FL (ref 79.6–97.8)
MONOCYTES: 11 % (ref 4.0–12.0)
MPV: 12.3 FL (ref 9.4–12.3)
NEUTROPHILS: 45 % (ref 43–78)
PLATELET COMMENTS: ADEQUATE
PLATELET: 122 10*3/uL — ABNORMAL LOW (ref 150–450)
RBC: 3.25 M/uL — ABNORMAL LOW (ref 4.23–5.6)
RDW: 14.8 % — ABNORMAL HIGH (ref 11.9–14.6)
WBC: 2.9 10*3/uL — ABNORMAL LOW (ref 4.3–11.1)

## 2019-03-02 LAB — METABOLIC PANEL, BASIC
Anion gap: 9 mmol/L (ref 7–16)
BUN: 11 MG/DL (ref 6–23)
CO2: 23 mmol/L (ref 21–32)
Calcium: 7.5 MG/DL — ABNORMAL LOW (ref 8.3–10.4)
Chloride: 107 mmol/L (ref 98–107)
Creatinine: 0.94 MG/DL (ref 0.8–1.5)
GFR est AA: >60 mL/min/{1.73_m2} (ref >60)
GFR est non-AA: >60 mL/min/{1.73_m2} (ref >60)
Glucose: 104 mg/dL — ABNORMAL HIGH (ref 65–100)
Potassium: 3.7 mmol/L (ref 3.5–5.1)
Sodium: 139 mmol/L (ref 136–145)

## 2019-03-02 LAB — OCCULT BLOOD, STOOL: Occult blood, stool: NEGATIVE

## 2019-03-02 MED ORDER — SODIUM CHLORIDE 0.9% BOLUS IV
0.9 % | Freq: Once | INTRAVENOUS | Status: AC
Start: 2019-03-02 — End: 2019-03-02
  Administered 2019-03-02: 21:00:00 via INTRAVENOUS

## 2019-03-02 MED ORDER — SODIUM CHLORIDE 0.9% BOLUS IV
0.9 % | Freq: Once | INTRAVENOUS | Status: AC
Start: 2019-03-02 — End: 2019-03-02
  Administered 2019-03-02: 12:00:00 via INTRAVENOUS

## 2019-03-02 MED ORDER — FAMOTIDINE 10 MG TAB
10 mg | Freq: Two times a day (BID) | ORAL | Status: DC
Start: 2019-03-02 — End: 2019-03-03
  Administered 2019-03-02 – 2019-03-03 (×2): via ORAL

## 2019-03-02 MED ORDER — TUBERCULIN PPD 5 UNIT/0.1 ML INTRADERMAL
5 tub. unit /0.1 mL | Freq: Once | INTRADERMAL | Status: AC
Start: 2019-03-02 — End: 2019-03-03

## 2019-03-02 MED ORDER — ACETAMINOPHEN 325 MG TABLET
325 mg | Freq: Four times a day (QID) | ORAL | Status: DC | PRN
Start: 2019-03-02 — End: 2019-03-05
  Administered 2019-03-02: 06:00:00 via ORAL

## 2019-03-02 MED ORDER — LORAZEPAM 2 MG/ML IJ SOLN
2 mg/mL | Freq: Once | INTRAMUSCULAR | Status: AC | PRN
Start: 2019-03-02 — End: 2019-03-04

## 2019-03-02 MED ORDER — PROMETHAZINE 25 MG/ML INJECTION
25 mg/mL | Freq: Once | INTRAMUSCULAR | Status: AC
Start: 2019-03-02 — End: 2019-03-02
  Administered 2019-03-02: 21:00:00 via INTRAMUSCULAR

## 2019-03-02 MED ORDER — SODIUM CHLORIDE 0.9% BOLUS IV
0.9 % | Freq: Once | INTRAVENOUS | Status: AC
Start: 2019-03-02 — End: 2019-03-02
  Administered 2019-03-02: 17:00:00 via INTRAVENOUS

## 2019-03-02 MED FILL — HEPARIN (PORCINE) 5,000 UNIT/ML IJ SOLN: 5000 unit/mL | INTRAMUSCULAR | Qty: 1

## 2019-03-02 MED FILL — MORPHINE 2 MG/ML INJECTION: 2 mg/mL | INTRAMUSCULAR | Qty: 1

## 2019-03-02 MED FILL — PIPERACILLIN-TAZOBACTAM 3.375 GRAM IV SOLR: 3.375 gram | INTRAVENOUS | Qty: 3.38

## 2019-03-02 MED FILL — MAPAP (ACETAMINOPHEN) 325 MG TABLET: 325 mg | ORAL | Qty: 2

## 2019-03-02 MED FILL — HEARTBURN RELIEF (FAMOTIDINE) 10 MG TABLET: 10 mg | ORAL | Qty: 2

## 2019-03-02 MED FILL — PROMETHAZINE 25 MG/ML INJECTION: 25 mg/mL | INTRAMUSCULAR | Qty: 1

## 2019-03-02 MED FILL — ONDANSETRON (PF) 4 MG/2 ML INJECTION: 4 mg/2 mL | INTRAMUSCULAR | Qty: 2

## 2019-03-02 NOTE — Progress Notes (Signed)
Pt resting at the moment. Pt requesting pain medication however the BP borderline hypotensive. 1 Liter flluid bolus complete. BP 94/ 47. Pt no longer nauseous. Call light within reach.. bed low and locked. Hourly rounds completed this shift..

## 2019-03-02 NOTE — Progress Notes (Signed)
Patient found in the floor in the bathroom. As per nurse pt  Denies head trauma. No evidence of head trauma or any other severe lesion. Will  Monitor.

## 2019-03-02 NOTE — Progress Notes (Signed)
Pt vomited into trash can. zofran 4mg IVP administered.  MRI doc completed. Pt reports claustrophobia with MRI. Pt reports he has needed anxiety medication in the past to undergo one. Will make hospitalist aware.

## 2019-03-02 NOTE — Progress Notes (Signed)
Post Fall Documentation      CARDIN MALSOM unwitnessed fall occurred on 03/02/19 (Date) at 2210 (Time).     The answers to the following questions summarize the fall:     In the patient's own words,:  ?? What were you attempting to do when you fell?  "Sit on the bucket & vomit in the toilet."  ?? Do you know why you fell? "I tripped over the bucket."  ?? Do you have any pain/discomfort or any other complaints?  "Nothing related to the fall."  ?? Which part of your body made contact with the floor or other object?  "I just sort of slide down, so only my side."    Nurse:  ? Was this an assisted fall? no  ? Was fall witnessed? No  ? If witnessed, what part of the body made contact with the floor or other object?    ? Patient???s mental status after the fall/when found: Alert and oriented  ? Any apparent injury:  No apparent injury  ? Immediate interventions for injury/suspected injury? No interventions needed  ? Patient assisted back to bed? Assist X2  ? Name of provider notified and time, any comments? Dr. Dolores Frame notified on 03/02/19 @ 22:13. RN was instructed to continue to monitor & notify MD of any changes.       ? Name of family member notified and time: RN attempted to notify Jennette Kettle with no answer or response.       Immediate VS and physical assessment documented in flow sheets.    Neuro assessment every hour x 4 (for potential head injury or unwitnessed fall) documented in flow sheets.      Lurena Nida, RN

## 2019-03-02 NOTE — Progress Notes (Signed)
PT's BP 86/52..Md notified and order placed for  tylenol for now. 1 dose tonight. She states " I am being cautious due to his elevated lfts, and Hold on iv pain meds for now in view of low bp readings "  Will continue to monitor BP

## 2019-03-02 NOTE — Progress Notes (Signed)
Hospitalist Note     Admit Date:  03/01/2019  8:04 AM   Name:  Vincent Smith   Age:  58 y.o.  DOB:  September 11, 1961   MRN:  [id number]   PCP:  Lacey Jensen, MD  Treatment Team: Attending Provider: Helmut Muster, MD    HPI/Subjective:   Patient is seen and examined at bedside.    Had an episode of hypotension which improved without any intervention. Had episodes of headaches but refused CT Head yesterday but agreeable today.   Continues to complain that "I feel like crap". Generalized abdominal and body pain.    Tolerating PO and had 1 BM since admission.     Objective:     Patient Vitals for the past 24 hrs:   Temp Pulse Resp BP SpO2   03/02/19 0506 98 ??F (36.7 ??C) 83 20 115/62 97 %   03/02/19 0317 98.7 ??F (37.1 ??C) 77 20 104/59 96 %   03/02/19 0045 98.7 ??F (37.1 ??C) 73 21 92/50 96 %   03/01/19 1955 98.5 ??F (36.9 ??C) 78 18 99/56 98 %   03/01/19 1513 97.9 ??F (36.6 ??C) 71 18 105/70 98 %   03/01/19 1300 ??? 64 19 105/63 100 %   03/01/19 1240 ??? 75 20 116/73 98 %   03/01/19 0811 98.1 ??F (36.7 ??C) 76 16 115/61 96 %     Oxygen Therapy  O2 Sat (%): 97 % (03/02/19 0506)  Pulse via Oximetry: 67 beats per minute (03/01/19 1300)  O2 Device: Room air (03/01/19 0811)    Estimated body mass index is 27.26 kg/m?? as calculated from the following:    Height as of this encounter: 5' 10"  (1.778 m).    Weight as of this encounter: 86.2 kg (190 lb).      Intake/Output Summary (Last 24 hours) at 03/02/2019 0729  Last data filed at 03/01/2019 1425  Gross per 24 hour   Intake 240 ml   Output 700 ml   Net -460 ml       *Note that automatically entered I/Os may not be accurate; dependent on patient compliance with collection and accurate data entry by techs.    Physical Exam:     General:                   alert, awake, jaundice, ill appearing   Head:                        normocephalic, atraumatic  Eyes, Ears, nose:    PERRL, EOMI. Sclera icterus  Neck:                         supple, non-tender    Lungs:                       Crackles on the bases without any wheezing.  Cardiac:                     RRR, Normal S1 and S2.    Abdomen:                  Soft, slightly distended, TTP diffusely without guarding  Extremities:               Warm, dry. No edema   Skin:  No rashes, no jaundice  Neuro:              Alert and oriented to person, time, place, situation. No gross focal neurological deficit  Psychiatric:                No anxiety, calm, cooperative        Data Review:  I have reviewed all labs, meds, and studies from the last 24 hours:    Recent Results (from the past 24 hour(s))   EKG, 12 LEAD, INITIAL    Collection Time: 03/01/19  8:07 AM   Result Value Ref Range    Ventricular Rate 78 BPM    Atrial Rate 78 BPM    P-R Interval 154 ms    QRS Duration 100 ms    Q-T Interval 388 ms    QTC Calculation (Bezet) 442 ms    Calculated P Axis 75 degrees    Calculated R Axis -13 degrees    Calculated T Axis 40 degrees    Diagnosis         Sinus rhythm with Premature supraventricular complexes and Premature   ventricular complexes or Fusion complexes  Otherwise normal ECG  When compared with ECG of 08-Dec-2010 00:24,  Premature ventricular complexes are now Present  Premature supraventricular complexes are now Present  Confirmed by Driscilla Grammes ([id number]) on 03/01/2019 12:41:26 PM     TROPONIN I    Collection Time: 03/01/19  8:11 AM   Result Value Ref Range    Troponin-I, Qt. <0.02 (L) 0.02 - 0.05 NG/ML   CBC WITH AUTOMATED DIFF    Collection Time: 03/01/19  8:11 AM   Result Value Ref Range    WBC 4.3 4.3 - 11.1 K/uL    RBC 3.50 (L) 4.23 - 5.6 M/uL    HGB 10.6 (L) 13.6 - 17.2 g/dL    HCT 32.1 (L) 41.1 - 50.3 %    MCV 91.7 79.6 - 97.8 FL    MCH 30.3 26.1 - 32.9 PG    MCHC 33.0 31.4 - 35.0 g/dL    RDW 14.6 11.9 - 14.6 %    PLATELET 148 (L) 150 - 450 K/uL    MPV 12.2 9.4 - 12.3 FL    ABSOLUTE NRBC 0.00 0.0 - 0.2 K/uL    NEUTROPHILS 47 43 - 78 %    LYMPHOCYTES 35 13 - 44 %     MONOCYTES 14 (H) 4.0 - 12.0 %    EOSINOPHILS 2 0.5 - 7.8 %    BASOPHILS 1 0.0 - 2.0 %    IMMATURE GRANULOCYTES 1 0.0 - 5.0 %    ABS. NEUTROPHILS 2.1 1.7 - 8.2 K/UL    ABS. LYMPHOCYTES 1.5 0.5 - 4.6 K/UL    ABS. MONOCYTES 0.6 0.1 - 1.3 K/UL    ABS. EOSINOPHILS 0.1 0.0 - 0.8 K/UL    ABS. BASOPHILS 0.0 0.0 - 0.2 K/UL    ABS. IMM. GRANS. 0.0 0.0 - 0.5 K/UL    DF AUTOMATED     METABOLIC PANEL, COMPREHENSIVE    Collection Time: 03/01/19  8:11 AM   Result Value Ref Range    Sodium 134 (L) 136 - 145 mmol/L    Potassium 3.4 (L) 3.5 - 5.1 mmol/L    Chloride 103 98 - 107 mmol/L    CO2 24 21 - 32 mmol/L    Anion gap 7 7 - 16 mmol/L    Glucose 111 (H) 65 - 100 mg/dL  BUN 20 6 - 23 MG/DL    Creatinine 0.94 0.8 - 1.5 MG/DL    GFR est AA >60 >60 ml/min/1.58m    GFR est non-AA >60 >60 ml/min/1.752m   Calcium 7.7 (L) 8.3 - 10.4 MG/DL    Bilirubin, total 4.9 (H) 0.2 - 1.1 MG/DL    ALT (SGPT) 964 (H) 12 - 65 U/L    AST (SGOT) 928 (H) 15 - 37 U/L    Alk. phosphatase 178 (H) 50 - 136 U/L    Protein, total 6.4 6.3 - 8.2 g/dL    Albumin 2.1 (L) 3.5 - 5.0 g/dL    Globulin 4.3 (H) 2.3 - 3.5 g/dL    A-G Ratio 0.5 (L) 1.2 - 3.5     PROCALCITONIN    Collection Time: 03/01/19  8:11 AM   Result Value Ref Range    Procalcitonin 3.44 ng/mL   PROTHROMBIN TIME + INR    Collection Time: 03/01/19  8:11 AM   Result Value Ref Range    Prothrombin time 18.0 (H) 12.0 - 14.7 sec    INR 1.4     INFLUENZA A & B AG (RAPID TEST)    Collection Time: 03/01/19  9:18 AM   Result Value Ref Range    Influenza A Ag NEGATIVE  NEG      Influenza B Ag NEGATIVE  NEG      Source Nasopharyngeal     LACTIC ACID    Collection Time: 03/01/19 10:31 AM   Result Value Ref Range    Lactic acid 4.4 (HH) 0.4 - 2.0 MMOL/L   URINALYSIS W/ RFLX MICROSCOPIC    Collection Time: 03/01/19 12:49 PM   Result Value Ref Range    Color AMBER      Appearance CLEAR      Specific gravity 1.010 1.001 - 1.023      pH (UA) 6.0 5.0 - 9.0      Protein NEGATIVE  NEG mg/dL     Glucose NEGATIVE  mg/dL    Ketone NEGATIVE  NEG mg/dL    Bilirubin MODERATE (A) NEG      Blood NEGATIVE  NEG      Urobilinogen 4.0 (H) 0.2 - 1.0 EU/dL    Nitrites NEGATIVE  NEG      Leukocyte Esterase NEGATIVE  NEG     DRUG SCREEN, URINE    Collection Time: 03/01/19 12:49 PM   Result Value Ref Range    PCP(PHENCYCLIDINE) NEGATIVE       BENZODIAZEPINES NEGATIVE       COCAINE NEGATIVE       AMPHETAMINES POSITIVE      METHADONE NEGATIVE       THC (TH-CANNABINOL) NEGATIVE       OPIATES NEGATIVE       BARBITURATES NEGATIVE      ACETAMINOPHEN    Collection Time: 03/01/19  3:00 PM   Result Value Ref Range    Acetaminophen level <10 (L) 10.0 - 30.0 ug/mL   BILIRUBIN, DIRECT    Collection Time: 03/01/19  3:00 PM   Result Value Ref Range    Bilirubin, direct 4.0 (H) <0.4 MG/DL   TRANSFERRIN SATURATION    Collection Time: 03/01/19  3:00 PM   Result Value Ref Range    Iron 68 35 - 150 ug/dL    TIBC 168 (L) 250 - 450 ug/dL    Transferrin Saturation 40 >20 %   FERRITIN    Collection Time: 03/01/19  3:00 PM  Result Value Ref Range    Ferritin 411 (H) 8 - 388 NG/ML   LACTIC ACID    Collection Time: 03/01/19  3:00 PM   Result Value Ref Range    Lactic acid 2.6 (HH) 0.4 - 2.0 MMOL/L   LD    Collection Time: 03/01/19  3:00 PM   Result Value Ref Range    LD 215 (H) 100 - 190 U/L   OCCULT BLOOD, STOOL    Collection Time: 03/01/19  5:51 PM   Result Value Ref Range    Occult blood, stool NEGATIVE  NEG     AMMONIA    Collection Time: 03/01/19  9:23 PM   Result Value Ref Range    Ammonia 22 11 - 32 UMOL/L   METABOLIC PANEL, BASIC    Collection Time: 03/02/19  6:01 AM   Result Value Ref Range    Sodium 139 136 - 145 mmol/L    Potassium 3.7 3.5 - 5.1 mmol/L    Chloride 107 98 - 107 mmol/L    CO2 23 21 - 32 mmol/L    Anion gap 9 7 - 16 mmol/L    Glucose 104 (H) 65 - 100 mg/dL    BUN 11 6 - 23 MG/DL    Creatinine 0.94 0.8 - 1.5 MG/DL    GFR est AA >60 >60 ml/min/1.68m    GFR est non-AA >60 >60 ml/min/1.745m    Calcium 7.5 (L) 8.3 - 10.4 MG/DL   CBC WITH AUTOMATED DIFF    Collection Time: 03/02/19  6:01 AM   Result Value Ref Range    WBC 2.9 (L) 4.3 - 11.1 K/uL    RBC 3.25 (L) 4.23 - 5.6 M/uL    HGB 9.8 (L) 13.6 - 17.2 g/dL    HCT 29.5 (L) 41.1 - 50.3 %    MCV 90.8 79.6 - 97.8 FL    MCH 30.2 26.1 - 32.9 PG    MCHC 33.2 31.4 - 35.0 g/dL    RDW 14.8 (H) 11.9 - 14.6 %    PLATELET 122 (L) 150 - 450 K/uL    MPV 12.3 9.4 - 12.3 FL    ABSOLUTE NRBC 0.00 0.0 - 0.2 K/uL    DF PENDING    HEPATIC FUNCTION PANEL    Collection Time: 03/02/19  6:01 AM   Result Value Ref Range    Protein, total 5.7 (L) 6.3 - 8.2 g/dL    Albumin 1.7 (L) 3.5 - 5.0 g/dL    Globulin 4.0 (H) 2.3 - 3.5 g/dL    A-G Ratio 0.4 (L) 1.2 - 3.5      Bilirubin, total 4.2 (H) 0.2 - 1.1 MG/DL    Bilirubin, direct 3.7 (H) <0.4 MG/DL    Alk. phosphatase 170 (H) 50 - 136 U/L    AST (SGOT) 598 (H) 15 - 37 U/L    ALT (SGPT) 683 (H) 12 - 65 U/L        All Micro Results     Procedure Component Value Units Date/Time    CULTURE, BLOOD [[id number]]ollected:  03/01/19 1235    Order Status:  Completed Specimen:  Blood Updated:  03/01/19 1400    CULTURE, BLOOD [[id number]]ollected:  03/01/19 1235    Order Status:  Completed Specimen:  Blood Updated:  03/01/19 1400    INFLUENZA A & B AG (RAPID TEST) [[id number]]ollected:  03/01/19 0918    Order Status:  Completed Specimen:  Nasopharyngeal from Nasal washing Updated:  03/01/19  0939     Influenza A Ag NEGATIVE         Comment: NEGATIVE FOR THE PRESENCE OF INFLUENZA A ANTIGEN  INFECTION DUE TO INFLUENZA A CANNOT BE RULED OUT.  BECAUSE THE ANTIGEN PRESENT IN THE SAMPLE MAY BE BELOW  THE DETECTION LIMIT OF THE TEST.  A NEGATIVE TEST IS PRESUMPTIVE AND IT IS RECOMMENDED THAT THESE RESULTS BE CONFIRMED BY VIRAL CULTURE OR AN FDA-CLEARED INFLUENZA A AND B MOLECULAR ASSAY.          Influenza B Ag NEGATIVE         Comment: NEGATIVE FOR THE PRESENCE OF INFLUENZA B ANTIGEN  INFECTION DUE TO INFLUENZA B CANNOT BE RULED OUT.   BECAUSE THE ANTIGEN PRESENT IN THE SAMPLE MAY BE BELOW  THE DETECTION LIMIT OF THE TEST.  A NEGATIVE TEST IS PRESUMPTIVE AND IT IS RECOMMENDED THAT THESE RESULTS BE CONFIRMED BY VIRAL CULTURE OR AN FDA-CLEARED INFLUENZA A AND B MOLECULAR ASSAY.          Source Nasopharyngeal             Current Meds:  Current Facility-Administered Medications   Medication Dose Route Frequency   ??? acetaminophen (TYLENOL) tablet 650 mg  650 mg Oral Q6H PRN   ??? sodium chloride (NS) flush 5-40 mL  5-40 mL IntraVENous Q8H   ??? sodium chloride (NS) flush 5-40 mL  5-40 mL IntraVENous PRN   ??? diphenhydrAMINE (BENADRYL) capsule 25 mg  25 mg Oral Q4H PRN   ??? ondansetron (ZOFRAN) injection 4 mg  4 mg IntraVENous Q4H PRN   ??? senna-docusate (PERICOLACE) 8.6-50 mg per tablet 2 Tab  2 Tab Oral DAILY PRN   ??? heparin (porcine) injection 5,000 Units  5,000 Units SubCUTAneous Q8H   ??? albuterol-ipratropium (DUO-NEB) 2.5 MG-0.5 MG/3 ML  3 mL Nebulization Q6H PRN   ??? piperacillin-tazobactam (ZOSYN) 3.375 g in 0.9% sodium chloride (MBP/ADV) 100 mL  3.375 g IntraVENous Q8H   ??? morphine injection 1 mg  1 mg IntraVENous Q4H PRN   ??? influenza vaccine 2019-20 (6 mos+)(PF) (FLUARIX/FLULAVAL/FLUZONE QUAD) injection 0.5 mL  0.5 mL IntraMUSCular PRIOR TO DISCHARGE       Other Studies:    [place] Abd Comp    Result Date: 03/01/2019  ABDOMINAL ULTRASOUND. HISTORY: Elevated liver enzymes, jaundice and abdominal pain. COMPARISON: None FINDINGS: Pancreas is grossly unremarkable although the tail is partially obscured by overlying bowel gas.. Aorta is normal caliber, 2.6 cm. The IVC is patent. The enlarged spleen has a homogenous echotexture and measures 23 cm. Left kidney measures 15.1 cm included upper pole cyst calcification. No hydronephrosis. Right kidney is unremarkable without hydronephrosis and measures 13.6 cm. Renal echogenicity is normal, the right kidney is hypoechoic to the liver. The intrahepatic biliary tree is not dilated.  There is hepatopetal flow in the portal vein. Small echogenic hemangioma right lobe liver, 9 x 13 mm. The gallbladder wall is thickened, up to 8 mm. Multiple small echogenic shadowing gallstones. The common bile duct is not dilated, 6 mm.     IMPRESSION: Cholelithiasis with gallbladder wall thickening, possibly acute or chronic cholecystitis. No biliary tree obstruction.     Xr Chest Port    Result Date: 03/01/2019  1 View portable chest x-ray 03/01/2019 8:32 AM Indication: Chest pain Comparison: 12/08/2010 Findings: This portable upright AP chest of 0809 shows the monitoring leads overlying the chest. Lung volumes are low, only slight crowding though suggested at the bases. Cardiomediastinal silhouette within normal limits. No  confluent infiltrate, no significant effusion. Vascularity seems appropriate.     IMPRESSION: Mild pulmonary hypoinflation. Otherwise no acute infiltrate suggested.          Assessment and Plan:     Hospital Problems as of 03/02/2019 Date Reviewed: 03/06/19          Codes Class Noted - Resolved POA    Pancytopenia ([place]) ICD-10-CM: I77.824  ICD-9-CM: 284.19  03/02/2019 - Present Unknown        Jaundice ICD-10-CM: R17  ICD-9-CM: 782.4  2019/03/06 - Present Unknown        CAD (coronary artery disease) ICD-10-CM: I25.10  ICD-9-CM: 414.00  03-06-2019 - Present Unknown        COPD ICD-9-CM: 496  03-06-2019 - Present Unknown        Normocytic anemia ICD-10-CM: D64.9  ICD-9-CM: 285.9  03/06/2019 - Present Unknown        Lactic acidosis ICD-10-CM: E87.2  ICD-9-CM: 276.2  March 06, 2019 - Present Unknown        * (Principal) Elevated liver enzymes ICD-10-CM: R74.8  ICD-9-CM: 790.5  03/06/2019 - Present Unknown              Plan:    Mr. 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   ??  Abdominal Pain, Jaundice, Hepatocellular pattern of elevated Liver Enzymes   Cholecystitis due to cholelithiasis  - LFTs down trending.    - Normal tylenol level but drug screen for amphetamines.   - ultrasound Abd : Cholelithiasis within the gallbladder with wall thickening.  No biliary tree obstruction.  - Hepatitis panel, HIV pending  - zosyn (03-06-23  - GI consult pending.  ??  Lactic acidosis: improving  - elevated procalc, fatigue with subjected fevers at home. abdominal exam is not impressive for bowel ischemia  - Afrebril, no leukocytosis on arrival.  CXR showed no acute infectious process. 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 Along with splenomegaly, elevated LDH likely be causing destruction in the enlarged spleen.  - stool occult neg.  - peripheral smear  ??  MI s/p stents (3-483yrago)  - not on any meds  ??  COPD  - not on any meds  - nebs prn  - o2 supplement as needed    Headache S/p Fall (prior to admission)  - did not hit his head or LOC per patient  - CT Head w/o contrast    Diet:  DIET REGULAR  DIET NUTRITIONAL SUPPLEMENTS  DVT PPx: heparin sq  Code: Full Code      Medical Decision Making:    Labs/Imaging reviewed.  Additional information obtained from nursing staff.  Patient is high risk due to medical condition and comorbidities.  Plan discussed with nursing staff.  Plan discussed with patient/family.  All questions/concerns were addressed. Pt/family agrees with the plan.         Signed By: BaHelmut MusterMD     March 02, 2019

## 2019-03-02 NOTE — Progress Notes (Signed)
Pt cannot have MRI completed today due to MRI tech leaving early today. Pt will have MRI done in morning. Pt will have to be NPO after MN. Will make ordering provider aware.

## 2019-03-02 NOTE — Progress Notes (Addendum)
Nutrition Assessment for:   Best Practice Alert for Malnutrition Screening Tool: Recently Lost Weight Without Trying: Yes, If Yes, How Much Weight Loss: 14 - 23 lbs, Eating Poorly Due to Decreased Appetite: Yes    Assessment:   PMH: MI, COPD  ED visit 12/28/2018 for chest pain.  CT abdomen pelvis done at that time showed cirrhotic liver, splenomegaly and cholelithiasis   Pt presented with 2 week hx of feeling tired and lethargic, also with abdominal pain and decreased appetite and no intake for few days.  Findings of elevated LFT's and jaundice  Diet orders: 3-7: Regular with Ensure Enlive once a day, 3-9: NPO for MRCP tomorrow  Pt very lethargic and with mumbled responses at time of RD visit. He was able to report "no appetite" for 1 week and 15# of weight loss. Unable to obtain any other nutrition or weight history from pt.  Per documentation, patient consuming average 75% of 2 recorded meals in 1 days. This has the potential to meet 75-100% of needs.    Anthropometrics:  Height: 5' 10" (177.8 cm), Weight: 86.2 kg (190 lb), Weight Source: Patient stated, Body mass index is 27.26 kg/m??. BMI class of overweight. No recent weight hx in EMR. Unable to assess for weight loss without a recent weight history or current actual weight.    Macronutrient needs: (using Stated body weight 86.2 kg)  EER: 1724-2155 kcal/day (20-25 kcal/kg)  EPR: 69-86 g/day (0.8-1.0 g/kg)      Nutrition Diagnosis:  Defer until weight and nutrition history and more intake data available    Nutrition Intervention:  Weight ordered yesterday. Will add daily weight order today.  Meals and snacks: Continue current diet  Medical food supplement therapy: Commercial beverage- Continue Ensure Enlive one a day.  Discharge Nutrition Plan: Too soon to determine.    Undray Allman, RD, LD, CNSC [phone]

## 2019-03-02 NOTE — Consults (Addendum)
[facility] Consult Note       Primary GI Physician: None    Referring Provider:  Dr. Waynette Buttery    Consult Date:  03/02/2019    Admit Date:  03/01/2019    Chief Complaint:  Abnormal LFTS    Subjective:     History of Present Illness:  Patient is a 58 y.o. male with PMH of (but not limited to) CAD on Plavix, COPD, HTN, Seizures and GERD, who is seen in consultation at the request of Dr. Waynette Buttery for abnormal LFTS. He has had RUQ abdominal pain over the last couple of weeks that has been progressively getting worse. It is worse after eating and radiates into his back. He has associated nausea and GERD, but no vomiting. He denies any diarrhea or change in bowel habits. He denies any ETOH or drug usage. He has never been evaluated by a GI and never had a previous procedure to include colonoscopy/EGD. He denies any NSAIDS or Tylenol. On evaluation in the ED, he was found to have abnormal LFTs to include T bili 4.2, direct bili 3.7, ALT 683, AST 598, AP 170, lactic acid 2.2, WBC 2.9, Hgb 9.8, Hct 29.5, plt 122, INR 1.4, BUN 11, Creat 0.94. hepatitis, ceruloplasmin, HIV pending. He has been started on Flagyl and Zosyn.     ABDOMINAL ULTRASOUND. 03/01/2019  ??  HISTORY: Elevated liver enzymes, jaundice and abdominal pain.  ??  COMPARISON: None  ??  FINDINGS: Pancreas is grossly unremarkable although the tail is partially  obscured by overlying bowel gas.. Aorta is normal caliber, 2.6 cm. The IVC is  patent. The enlarged spleen has a homogenous echotexture and measures 23 cm.   Left kidney measures 15.1 cm included upper pole cyst calcification. No  hydronephrosis. Right kidney is unremarkable without hydronephrosis and measures  13.6 cm. Renal echogenicity is normal, the right kidney is hypoechoic to the  liver. The intrahepatic biliary tree is not dilated. There is hepatopetal flow  in the portal vein. Small echogenic hemangioma right lobe liver, 9 x 13 mm. The   gallbladder wall is thickened, up to 8 mm. Multiple small echogenic shadowing  gallstones. The common bile duct is not dilated, 6 mm.   ??  IMPRESSION  IMPRESSION: Cholelithiasis with gallbladder wall thickening, possibly acute or  chronic cholecystitis. No biliary tree obstruction.  ??    PMH:  Past Medical History:   Diagnosis Date   ??? CAD (coronary artery disease)    ??? COPD    ??? Gastrointestinal disorder     acid reflux and ulcers   ??? Hypertension    ??? Seizures (HCC)        PSH:  Past Surgical History:   Procedure Laterality Date   ??? CARDIAC SURG PROCEDURE UNLIST      2 stents       Allergies:  No Known Allergies    Home Medications:  Prior to Admission medications    Medication Sig Start Date End Date Taking? Authorizing Provider   clopidogrel (PLAVIX) 75 mg tablet Take 75 mg by mouth daily.    Other, Phys, MD   nitroglycerin (NITROSTAT) 0.4 mg SL tablet 0.4 mg by SubLINGual route every five (5) minutes as needed.    Other, Phys, MD   IPRATROPIUM/ALBUTEROL SULFATE (COMBIVENT IN) Take  by inhalation two (2) times a day.    Other, Phys, MD   HYDROcodone-acetaminophen (NORCO) 10-325 mg tablet Take 1 Tab by mouth as needed.    Other, Phys,  MD   OTHER daily. BP med  / unknown name or dose     Other, Phys, MD   OTHER daily. Cholesterol med / unknown name or dose     Other, Phys, MD       Hospital Medications:  Current Facility-Administered Medications   Medication Dose Route Frequency   ??? acetaminophen (TYLENOL) tablet 650 mg  650 mg Oral Q6H PRN   ??? tuberculin injection 5 Units  5 Units IntraDERMal ONCE   ??? sodium chloride (NS) flush 5-40 mL  5-40 mL IntraVENous Q8H   ??? sodium chloride (NS) flush 5-40 mL  5-40 mL IntraVENous PRN   ??? diphenhydrAMINE (BENADRYL) capsule 25 mg  25 mg Oral Q4H PRN   ??? ondansetron (ZOFRAN) injection 4 mg  4 mg IntraVENous Q4H PRN   ??? senna-docusate (PERICOLACE) 8.6-50 mg per tablet 2 Tab  2 Tab Oral DAILY PRN   ??? heparin (porcine) injection 5,000 Units  5,000 Units SubCUTAneous Q8H    ??? albuterol-ipratropium (DUO-NEB) 2.5 MG-0.5 MG/3 ML  3 mL Nebulization Q6H PRN   ??? piperacillin-tazobactam (ZOSYN) 3.375 g in 0.9% sodium chloride (MBP/ADV) 100 mL  3.375 g IntraVENous Q8H   ??? morphine injection 1 mg  1 mg IntraVENous Q4H PRN   ??? influenza vaccine 2019-20 (6 mos+)(PF) (FLUARIX/FLULAVAL/FLUZONE QUAD) injection 0.5 mL  0.5 mL IntraMUSCular PRIOR TO DISCHARGE       Social History:  Social History     Tobacco Use   ??? Smoking status: Former Smoker     Packs/day: 2.00   Substance Use Topics   ??? Alcohol use: No       Family History:  Family History   Problem Relation Age of Onset   ??? Hypertension Mother    ??? Hypertension Father    ??? Diabetes Father        Review of Systems:  A detailed 10 system ROS is obtained, with pertinent positives as listed above.  All others are negative.    Diet:  Regular diet    Objective:     Physical Exam:  Vitals:  Visit Vitals  BP 107/70   Pulse 74   Temp 97.7 ??F (36.5 ??C)   Resp 17   Ht  (1.778 m)   Wt 86.2 kg (190 lb)   SpO2 97%   BMI 27.26 kg/m??     Gen:  Pt is alert, cooperative, no acute distress JAUNDICE; DIFFICULT HISTORIAN  Skin:  Extremities and face reveal no rashes.   HEENT: Sclerae ICTERIC  Extra-occular muscles are intact.  No oral ulcers.  No abnormal pigmentation of the lips.  The neck is supple.  Cardiovascular: Regular rate and rhythm. No murmurs, gallops, or rubs.  Respiratory:  Comfortable breathing with no accessory muscle use. Clear breath sounds anteriorly with no wheezes, rales, or rhonchi.  GI:  Abdomen nondistended, soft, RUQ TENDERNESS; ? POSITIVE MURPHEYS  Normal active bowel sounds. No enlargement of the liver or spleen. No masses palpable.  Rectal:  Deferred  Musculoskeletal:  No pitting edema of the lower legs.    Neurological:  Gross memory appears intact.  Patient is alert and oriented.  Psychiatric:  Mood appears appropriate with judgement intact.  Lymphatic:  No cervical or supraclavicular adenopathy.    Laboratory:    Recent Labs      03/02/19  0601 03/01/19  1500 03/01/19  0811   WBC 2.9*  --  4.3   HGB 9.8*  --  10.6*   HCT  29.5*  --  32.1*   PLT 122*  --  148*   MCV 90.8  --  91.7   NA 139  --  134*   K 3.7  --  3.4*   CL 107  --  103   CO2 23  --  24   BUN 11  --  20   CREA 0.94  --  0.94   CA 7.5*  --  7.7*   GLU 104*  --  111*   AP 170*  --  178*   SGOT 598*  --  928*   ALT 683*  --  964*   TBILI 4.2*  --  4.9*   CBIL 3.7* 4.0*  --    ALB 1.7*  --  2.1*   TP 5.7*  --  6.4   PTP  --   --  18.0*   INR  --   --  1.4          Assessment:     Principal Problem:    Elevated liver enzymes (03/01/2019)    Active Problems:    Jaundice (03/01/2019)      CAD (coronary artery disease) (03/01/2019)      COPD (03/01/2019)      Normocytic anemia (03/01/2019)      Lactic acidosis (03/01/2019)      Pancytopenia (HCC) (03/02/2019)    58 y.o. male with PMH of (but not limited to) CAD on Plavix, COPD, HTN, Seizures and GERD, who is seen in consultation at the request of Dr. Waynette Buttery for abnormal LFTS. He has associated RUQ abdominal pain and nausea. On evaluation in the ED, he was found to have abnormal LFTs to include T bili 4.2, direct bili 3.7, ALT 683, AST 598, AP 170, lactic acid 2.2, WBC 2.9, Hgb 9.8, Hct 29.5, plt 122, INR 1.4, BUN 11, Creat 0.94. hepatitis, ceruloplasmin, HIV pending. Will obtain MRCP to evaluate for possible common bile duct stone.    Plan:     -MRCP today. If positive, may need ERCP in the future, but on Plavix therapy so not ideal  -Serial LFTS and PT INR  -Agree with antibiotics  -Supportive care  -Await hepatitis panel  -Will need outpatient screening colonoscopy in the future    Reuel Boom. Omelia Blackwater, FNP  [facility]    Patient is seen and examined in collaboration with Dr. Yevonne Pax.  Assessment and plan as per Dr.S. Patty Sermons.        I have seen and examined this patient, and agree with above assessment and plan.    Pt sleeping soundly, but when he wakes, c/o diffuse abdom pain   Reports N/V today, eating cookies, pie, etc  WIll change to clears  F/U LFTs,  Planning additional imaging w/ MRCP to eval for CBD stone/ obstruction  If MRI NL, will likely need lap chole.  If + stones, will need ERCP    Donata Duff, MD

## 2019-03-02 NOTE — Progress Notes (Signed)
need mri screening. pt already ate. Nurse will npo after midnight. 03-02-19 jh

## 2019-03-02 NOTE — Progress Notes (Signed)
Pt BP 104/59.  Hourly round completed throughout the shift.  Denies any needs at this time Bed in lower position and call light/ personal items within reach. Will continue to monitor and give bed side shift report to on coming day shift nurse.

## 2019-03-02 NOTE — Progress Notes (Addendum)
Pt continue to vomit. Paging hospitalist.   Order received for one time dose of phenergan.

## 2019-03-02 NOTE — Progress Notes (Signed)
Pt c/o of hiccups that started last night and have not ceased. Pt requesting something to help ease them. Paging hospitalist.

## 2019-03-02 NOTE — Progress Notes (Addendum)
Pt irate because one of the phlebotomists angered him. Pt states "that bastard Vincent Smith coming in here trying to stick me." pt did not let this phlebotomist collect lactic acid. Another phlebotomist came. Pt will not let Vincent Smith phlebotomist collect lactic acid. This nurse stressed the importance of these labs in order to provide proper care while he is in hospital. Pt seems to be agreeable to having lab drawn in an hour. Pt states "let me rest a bit and maybe I'll have enough blood to give."    Pt also c/o not receiving pain medication and still feeling nauseous. This nurse has educated patient that is unsafe to administer morphine while BP is borderline hypotensive. Pt states "no it hasn't been!" Pt does not seem to be receptive to this information. Pt demanding his medication. Pt states he is still throwing up despite having been observed resting in bed with door open after dose of phenergan was administered. This nurse incapable of reasoning with patient at the moment.   Oncoming nurse made aware.

## 2019-03-02 NOTE — Progress Notes (Signed)
 Nutrition Assessment for:   Best Practice Alert for Malnutrition Screening Tool: Recently Lost Weight Without Trying: Yes, If Yes, How Much Weight Loss: 14 - 23 lbs, Eating Poorly Due to Decreased Appetite: Yes    Assessment:   PMH: MI, COPD  ED visit 12/28/2018 for chest pain.  CT abdomen pelvis done at that time showed cirrhotic liver, splenomegaly and cholelithiasis   Pt presented with 2 week hx of feeling tired and lethargic, also with abdominal pain and decreased appetite and no intake for few days.  Findings of elevated LFT's and jaundice  Diet orders: 3-7: Regular with Ensure Enlive once a day, 3-9: NPO for MRCP tomorrow  Pt very lethargic and with mumbled responses at time of RD visit. He was able to report no appetite for 1 week and 15# of weight loss. Unable to obtain any other nutrition or weight history from pt.  Per documentation, patient consuming average 75% of 2 recorded meals in 1 days. This has the potential to meet 75-100% of needs.    Anthropometrics:  Height: 5' 10 (177.8 cm), Weight: 86.2 kg (190 lb), Weight Source: Patient stated, Body mass index is 27.26 kg/m. BMI class of overweight. No recent weight hx in EMR. Unable to assess for weight loss without a recent weight history or current actual weight.    Macronutrient needs: (using Stated body weight 86.2 kg)  EER: 1724-2155 kcal/day (20-25 kcal/kg)  EPR: 69-86 g/day (0.8-1.0 g/kg)      Nutrition Diagnosis:  Defer until weight and nutrition history and more intake data available    Nutrition Intervention:  Weight ordered yesterday. Will add daily weight order today.  Meals and snacks: Continue current diet  Medical food supplement therapy: Commercial beverage- Continue Ensure Enlive one a day.  Discharge Nutrition Plan: Too soon to determine.    Greig Hummer, RD, LD, CNSC [phone]

## 2019-03-02 NOTE — Progress Notes (Signed)
Patient found in the floor in the bathroom. As per nurse pt  Denies head trauma. No evidence of head trauma or any other severe lesion. Will  Monitor.

## 2019-03-02 NOTE — Consults (Signed)
Consults by Luanna ColeBrackbill, Melora Menon Paul, MD at 03/02/19 1011                Author: Luanna ColeBrackbill, Tiffanyann Deroo Paul, MD  Service: Gastroenterology  Author Type: Physician       Filed: 03/02/19 1844  Date of Service: 03/02/19 1011  Status: Addendum          Editor: Luanna ColeBrackbill, Enes Rokosz Paul, MD (Physician)          Related Notes: Original Note by Annetta MawLuther, Tracy M, NP (Nurse Practitioner) filed at 03/02/19 1028            Consult Orders        1. IP CONSULT TO GASTROENTEROLOGY [[id number]] ordered by Virgina EvenerAung, Banyar, MD at 03/01/19 1459                                 [facility] Consult Note            Primary GI Physician: None      Referring Provider:  Dr. Waynette ButteryB. Aung      Consult Date:  03/02/2019      Admit Date:  03/01/2019      Chief Complaint:  Abnormal LFTS        Subjective:        History of Present Illness:  Patient is a 58 y.o.  male with PMH of (but not limited to) CAD on Plavix, COPD, HTN, Seizures and GERD, who is seen in consultation at the request of Dr. Waynette ButteryB. Aung  for abnormal LFTS. He has had RUQ abdominal pain over the last couple of weeks that has been progressively getting worse. It is worse after eating and radiates into his back. He has associated nausea and GERD, but no vomiting. He denies any diarrhea or  change in bowel habits. He denies any ETOH or drug usage. He has never been evaluated by a GI and never had a previous procedure to include colonoscopy/EGD. He denies any NSAIDS or Tylenol. On evaluation in the ED, he was found to have abnormal LFTs to  include T bili 4.2, direct bili 3.7, ALT 683, AST 598, AP 170, lactic acid 2.2, WBC 2.9, Hgb 9.8, Hct 29.5, plt 122, INR 1.4, BUN 11, Creat 0.94. hepatitis, ceruloplasmin, HIV pending. He has been started on Flagyl and Zosyn.       ABDOMINAL ULTRASOUND. 03/01/2019   ??   HISTORY: Elevated liver enzymes, jaundice and abdominal pain.   ??   COMPARISON: None   ??   FINDINGS: Pancreas is grossly unremarkable although the tail is partially   obscured by  overlying bowel gas.. Aorta is normal caliber, 2.6 cm. The IVC is   patent. The enlarged spleen has a homogenous echotexture and measures 23 cm.    Left kidney measures 15.1 cm included upper pole cyst calcification. No   hydronephrosis. Right kidney is unremarkable without hydronephrosis and measures   13.6 cm. Renal echogenicity is normal, the right kidney is hypoechoic to the   liver. The intrahepatic biliary tree is not dilated. There is hepatopetal flow   in the portal vein. Small echogenic hemangioma right lobe liver, 9 x 13 mm. The   gallbladder wall is thickened, up to 8 mm. Multiple small echogenic shadowing   gallstones. The common bile duct is not dilated, 6 mm.    ??   IMPRESSION   IMPRESSION: Cholelithiasis with gallbladder wall thickening, possibly acute or  chronic cholecystitis. No biliary tree obstruction.   ??      PMH:     Past Medical History:        Diagnosis  Date         ?  CAD (coronary artery disease)       ?  COPD       ?  Gastrointestinal disorder            acid reflux and ulcers         ?  Hypertension           ?  Seizures (HCC)             PSH:     Past Surgical History:         Procedure  Laterality  Date          ?  CARDIAC SURG PROCEDURE UNLIST              2 stents           Allergies:   No Known Allergies      Home Medications:     Prior to Admission medications             Medication  Sig  Start Date  End Date  Taking?  Authorizing Provider            clopidogrel (PLAVIX) 75 mg tablet  Take 75 mg by mouth daily.        Other, Phys, MD     nitroglycerin (NITROSTAT) 0.4 mg SL tablet  0.4 mg by SubLINGual route every five (5) minutes as needed.        Other, Phys, MD     IPRATROPIUM/ALBUTEROL SULFATE (COMBIVENT IN)  Take  by inhalation two (2) times a day.        Other, Phys, MD     HYDROcodone-acetaminophen (NORCO) 10-325 mg tablet  Take 1 Tab by mouth as needed.        Other, Phys, MD     OTHER  daily. BP med  / unknown name or dose         Other, Phys, MD            OTHER  daily.  Cholesterol med / unknown name or dose         Other, Phys, MD           Hospital Medications:     Current Facility-Administered Medications          Medication  Dose  Route  Frequency           ?  acetaminophen (TYLENOL) tablet 650 mg   650 mg  Oral  Q6H PRN     ?  tuberculin injection 5 Units   5 Units  IntraDERMal  ONCE     ?  sodium chloride (NS) flush 5-40 mL   5-40 mL  IntraVENous  Q8H     ?  sodium chloride (NS) flush 5-40 mL   5-40 mL  IntraVENous  PRN     ?  diphenhydrAMINE (BENADRYL) capsule 25 mg   25 mg  Oral  Q4H PRN     ?  ondansetron (ZOFRAN) injection 4 mg   4 mg  IntraVENous  Q4H PRN     ?  senna-docusate (PERICOLACE) 8.6-50 mg per tablet 2 Tab   2 Tab  Oral  DAILY PRN     ?  heparin (porcine) injection 5,000 Units   5,000  Units  SubCUTAneous  Q8H           ?  albuterol-ipratropium (DUO-NEB) 2.5 MG-0.5 MG/3 ML   3 mL  Nebulization  Q6H PRN           ?  piperacillin-tazobactam (ZOSYN) 3.375 g in 0.9% sodium chloride (MBP/ADV) 100 mL   3.375 g  IntraVENous  Q8H     ?  morphine injection 1 mg   1 mg  IntraVENous  Q4H PRN           ?  influenza vaccine 2019-20 (6 mos+)(PF) (FLUARIX/FLULAVAL/FLUZONE QUAD) injection 0.5 mL   0.5 mL  IntraMUSCular  PRIOR TO DISCHARGE           Social History:     Social History          Tobacco Use         ?  Smoking status:  Former Smoker              Packs/day:  2.00       Substance Use Topics         ?  Alcohol use:  No           Family History:     Family History         Problem  Relation  Age of Onset          ?  Hypertension  Mother       ?  Hypertension  Father            ?  Diabetes  Father             Review of Systems:   A detailed 10 system ROS is obtained, with pertinent positives as listed above.  All others are negative.      Diet:  Regular diet        Objective:        Physical Exam:   Vitals:   Visit Vitals      BP  107/70     Pulse  74     Temp  97.7 ??F (36.5 ??C)     Resp  17     Ht   (1.778 m)     Wt  86.2 kg (190 lb)     SpO2  97%        BMI  27.26  kg/m??        Gen:  Pt is alert, cooperative, no acute distress JAUNDICE; DIFFICULT HISTORIAN   Skin:  Extremities and face reveal no rashes.    HEENT: Sclerae ICTERIC  Extra-occular muscles are intact.  No oral ulcers.  No abnormal pigmentation of the lips.  The neck is supple.   Cardiovascular: Regular rate and rhythm. No murmurs, gallops, or rubs.   Respiratory:  Comfortable breathing with no accessory muscle use. Clear breath sounds anteriorly with no wheezes, rales, or rhonchi.   GI:  Abdomen nondistended, soft, RUQ TENDERNESS; ? POSITIVE MURPHEYS  Normal active bowel sounds. No enlargement of the liver or spleen. No  masses palpable.   Rectal:  Deferred   Musculoskeletal:  No pitting edema of the lower legs.     Neurological:  Gross memory appears intact.  Patient is alert and oriented.   Psychiatric:  Mood appears appropriate with judgement intact.   Lymphatic:  No cervical or supraclavicular adenopathy.      Laboratory:       Recent Labs  03/02/19   0601  03/01/19   1500  03/01/19   0811     WBC  2.9*   --   4.3     HGB  9.8*   --   10.6*     HCT  29.5*   --   32.1*     PLT  122*   --   148*     MCV  90.8   --   91.7     NA  139   --   134*     K  3.7   --   3.4*     CL  107   --   103     CO2  23   --   24     BUN  11   --   20     CREA  0.94   --   0.94     CA  7.5*   --   7.7*     GLU  104*   --   111*     AP  170*   --   178*     SGOT  598*   --   928*     ALT  683*   --   964*     TBILI  4.2*   --   4.9*     CBIL  3.7*  4.0*   --      ALB  1.7*   --   2.1*     TP  5.7*   --   6.4     PTP   --    --   18.0*          INR   --    --   1.4                 Assessment:        Principal Problem:     Elevated liver enzymes (03/01/2019)      Active Problems:     Jaundice (03/01/2019)        CAD (coronary artery disease) (03/01/2019)        COPD (03/01/2019)        Normocytic anemia (03/01/2019)        Lactic acidosis (03/01/2019)        Pancytopenia (HCC) (03/02/2019)      58 y.o. male with PMH of (but not limited  to) CAD on Plavix, COPD, HTN, Seizures and GERD, who is seen in consultation at the request of Dr. Waynette Buttery for abnormal LFTS. He has associated RUQ abdominal  pain and nausea. On evaluation in the ED, he was found to have abnormal LFTs to include T bili 4.2, direct bili 3.7, ALT 683, AST 598, AP 170, lactic acid 2.2, WBC 2.9, Hgb 9.8, Hct 29.5, plt 122, INR 1.4, BUN 11, Creat 0.94. hepatitis, ceruloplasmin,  HIV pending. Will obtain MRCP to evaluate for possible common bile duct stone.        Plan:        -MRCP today. If positive, may need ERCP in the future, but on Plavix therapy so not ideal   -Serial LFTS and PT INR   -Agree with antibiotics   -Supportive care   -Await hepatitis panel   -Will need outpatient screening colonoscopy in the future      Reuel Boom. Omelia Blackwater, FNP   [facility]      Patient is seen  and examined in collaboration with Dr. Yevonne Pax.  Assessment and plan as per Dr.S. Patty Sermons.            I have seen and examined this patient, and agree with above assessment and plan.      Pt sleeping soundly, but when he wakes, c/o diffuse abdom pain   Reports N/V today, eating cookies, pie, etc   WIll change to clears   F/U LFTs,   Planning additional imaging w/ MRCP to eval for CBD stone/ obstruction   If MRI NL, will likely need lap chole.  If + stones, will need ERCP      Donata Duff, MD

## 2019-03-02 NOTE — Progress Notes (Signed)
 PT's BP 86/52..Md notified and order placed for  tylenol for now. 1 dose tonight. She states  I am being cautious due to his elevated lfts, and Hold on iv pain meds for now in view of low bp readings   Will continue to monitor BP

## 2019-03-02 NOTE — Progress Notes (Signed)
Pt vomited into trash can. zofran 4mg  IVP administered.  MRI doc completed. Pt reports claustrophobia with MRI. Pt reports he has needed anxiety medication in the past to undergo one. Will make hospitalist aware.

## 2019-03-02 NOTE — Progress Notes (Signed)
Pt resting at the moment. Pt requesting pain medication however the BP borderline hypotensive. 1 Liter flluid bolus complete. BP 94/ 47. Pt no longer nauseous. Call light within reach.. bed low and locked. Hourly rounds completed this shift.Marland Kitchen

## 2019-03-02 NOTE — Progress Notes (Signed)
need mri screening. pt already ate. Nurse will npo after midnight. 03-02-19 jh

## 2019-03-02 NOTE — Progress Notes (Signed)
Pt BP 104/59.  Hourly round completed throughout the shift.  Denies any needs at this time Bed in lower position and call light/ personal items within reach. Will continue to monitor and give bed side shift report to on coming day shift nurse.

## 2019-03-02 NOTE — Progress Notes (Signed)
 Post Fall Documentation      Vincent Smith unwitnessed fall occurred on 03/02/19 (Date) at 2210 (Time).     The answers to the following questions summarize the fall:     In the patient's own words,:   What were you attempting to do when you fell?  Sit on the bucket & vomit in the toilet.   Do you know why you fell? I tripped over the bucket.   Do you have any pain/discomfort or any other complaints?  Nothing related to the fall.   Which part of your body made contact with the floor or other object?  I just sort of slide down, so only my side.    Nurse:  . Was this an assisted fall? no  . Was fall witnessed? No  . If witnessed, what part of the body made contact with the floor or other object?    . Patient's mental status after the fall/when found: Alert and oriented  . Any apparent injury:  No apparent injury  . Immediate interventions for injury/suspected injury? No interventions needed  . Patient assisted back to bed? Assist X2  . Name of provider notified and time, any comments? Dr. Artemio notified on 03/02/19 @ 22:13. RN was instructed to continue to monitor & notify MD of any changes.       . Name of family member notified and time: RN attempted to notify Vincent Smith with no answer or response.       Immediate VS and physical assessment documented in flow sheets.    Neuro assessment every hour x 4 (for potential head injury or unwitnessed fall) documented in flow sheets.      Fonda JONETTA Naas, RN

## 2019-03-02 NOTE — Progress Notes (Signed)
Progress  Notes by Helmut Muster, MD at 03/02/19 6213                Author: Helmut Muster, MD  Service: Internal Medicine  Author Type: Physician       Filed: 03/02/19 0823  Date of Service: 03/02/19 0729  Status: Signed          Editor: Helmut Muster, MD (Physician)                         Hospitalist Note        Admit Date:  03/01/2019  8:04 AM    Name:  Vincent Smith    Age:  58 y.o.   DOB:  11-Jul-1961    MRN:  [id number]    PCP:  Lacey Jensen, MD   Treatment Team: Attending Provider: Helmut Muster, MD        HPI/Subjective:     Patient is seen and examined at bedside.     Had an episode of hypotension which improved without any intervention. Had episodes of headaches but refused CT Head yesterday but agreeable today.    Continues to complain that "I feel like crap". Generalized abdominal and body pain.     Tolerating PO and had 1 BM since admission.         Objective:        Patient Vitals for the past 24 hrs:            Temp  Pulse  Resp  BP  SpO2            03/02/19 0506  98 ??F (36.7 ??C)  83  20  115/62  97 %            03/02/19 0317  98.7 ??F (37.1 ??C)  77  20  104/59  96 %     03/02/19 0045  98.7 ??F (37.1 ??C)  73  21  92/50  96 %     03/01/19 1955  98.5 ??F (36.9 ??C)  78  18  99/56  98 %     03/01/19 1513  97.9 ??F (36.6 ??C)  71  18  105/70  98 %     03/01/19 1300  --  64  19  105/63  100 %     03/01/19 1240  --  75  20  116/73  98 %            03/01/19 0811  98.1 ??F (36.7 ??C)  76  16  115/61  96 %        Oxygen Therapy   O2 Sat (%): 97 % (03/02/19 0506)   Pulse via Oximetry: 67 beats per minute (03/01/19 1300)   O2 Device: Room air (03/01/19 0811)      Estimated body mass index is 27.26 kg/m?? as calculated from the following:     Height as of this encounter: '5\' 10"'  (1.778 m).     Weight as of this encounter: 86.2 kg (190 lb).         Intake/Output Summary (Last 24 hours) at 03/02/2019 0729   Last data filed at 03/01/2019 1425     Gross per 24 hour        Intake  240 ml        Output  700 ml        Net  -460 ml           *Note that  automatically entered I/Os may not be accurate; dependent on patient compliance with collection and accurate data entry by techs.      Physical Exam:       General:                   alert, awake, jaundice, ill appearing    Head:                        normocephalic, atraumatic   Eyes, Ears, nose:    PERRL, EOMI. Sclera icterus   Neck:                         supple, non-tender    Lungs:                       Crackles on the bases without any wheezing.   Cardiac:                     RRR, Normal S1 and S2.     Abdomen:                  Soft, slightly distended, TTP diffusely without guarding   Extremities:               Warm, dry. No edema    Skin:                           No rashes, no jaundice   Neuro:              Alert and oriented to person, time, place, situation. No gross focal neurological deficit   Psychiatric:                No anxiety, calm, cooperative            Data Review:   I have reviewed all labs, meds, and studies from the last 24 hours:        Recent Results (from the past 24 hour(s))     EKG, 12 LEAD, INITIAL          Collection Time: 03/01/19  8:07 AM         Result  Value  Ref Range            Ventricular Rate  78  BPM       Atrial Rate  78  BPM       P-R Interval  154  ms       QRS Duration  100  ms       Q-T Interval  388  ms       QTC Calculation (Bezet)  442  ms       Calculated P Axis  75  degrees       Calculated R Axis  -13  degrees       Calculated T Axis  40  degrees       Diagnosis                    Sinus rhythm with Premature supraventricular complexes and Premature    ventricular complexes or Fusion complexes   Otherwise normal ECG   When compared with ECG of 08-Dec-2010 00:24,   Premature ventricular complexes are now Present   Premature supraventricular complexes are now Present   Confirmed by Driscilla Grammes ([id number]) on 03/01/2019 12:41:26  PM          TROPONIN I          Collection Time: 03/01/19  8:11 AM         Result  Value  Ref Range            Troponin-I, Qt.   <0.02 (L)  0.02 - 0.05 NG/ML       CBC WITH AUTOMATED DIFF          Collection Time: 03/01/19  8:11 AM         Result  Value  Ref Range            WBC  4.3  4.3 - 11.1 K/uL       RBC  3.50 (L)  4.23 - 5.6 M/uL       HGB  10.6 (L)  13.6 - 17.2 g/dL       HCT  32.1 (L)  41.1 - 50.3 %       MCV  91.7  79.6 - 97.8 FL       MCH  30.3  26.1 - 32.9 PG       MCHC  33.0  31.4 - 35.0 g/dL       RDW  14.6  11.9 - 14.6 %       PLATELET  148 (L)  150 - 450 K/uL       MPV  12.2  9.4 - 12.3 FL       ABSOLUTE NRBC  0.00  0.0 - 0.2 K/uL       NEUTROPHILS  47  43 - 78 %       LYMPHOCYTES  35  13 - 44 %       MONOCYTES  14 (H)  4.0 - 12.0 %       EOSINOPHILS  2  0.5 - 7.8 %       BASOPHILS  1  0.0 - 2.0 %       IMMATURE GRANULOCYTES  1  0.0 - 5.0 %       ABS. NEUTROPHILS  2.1  1.7 - 8.2 K/UL       ABS. LYMPHOCYTES  1.5  0.5 - 4.6 K/UL       ABS. MONOCYTES  0.6  0.1 - 1.3 K/UL       ABS. EOSINOPHILS  0.1  0.0 - 0.8 K/UL       ABS. BASOPHILS  0.0  0.0 - 0.2 K/UL       ABS. IMM. GRANS.  0.0  0.0 - 0.5 K/UL       DF  AUTOMATED          METABOLIC PANEL, COMPREHENSIVE          Collection Time: 03/01/19  8:11 AM         Result  Value  Ref Range            Sodium  134 (L)  136 - 145 mmol/L       Potassium  3.4 (L)  3.5 - 5.1 mmol/L       Chloride  103  98 - 107 mmol/L       CO2  24  21 - 32 mmol/L       Anion gap  7  7 - 16 mmol/L       Glucose  111 (H)  65 - 100 mg/dL       BUN  20  6 - 23 MG/DL  Creatinine  0.94  0.8 - 1.5 MG/DL       GFR est AA  >60  >60 ml/min/1.17m       GFR est non-AA  >60  >60 ml/min/1.735m      Calcium  7.7 (L)  8.3 - 10.4 MG/DL       Bilirubin, total  4.9 (H)  0.2 - 1.1 MG/DL       ALT (SGPT)  964 (H)  12 - 65 U/L       AST (SGOT)  928 (H)  15 - 37 U/L       Alk. phosphatase  178 (H)  50 - 136 U/L       Protein, total  6.4  6.3 - 8.2 g/dL       Albumin  2.1 (L)  3.5 - 5.0 g/dL       Globulin  4.3 (H)  2.3 - 3.5 g/dL       A-G Ratio  0.5 (L)  1.2 - 3.5         PROCALCITONIN          Collection Time: 03/01/19   8:11 AM         Result  Value  Ref Range            Procalcitonin  3.44  ng/mL       PROTHROMBIN TIME + INR          Collection Time: 03/01/19  8:11 AM         Result  Value  Ref Range            Prothrombin time  18.0 (H)  12.0 - 14.7 sec       INR  1.4          INFLUENZA A & B AG (RAPID TEST)          Collection Time: 03/01/19  9:18 AM         Result  Value  Ref Range            Influenza A Ag  NEGATIVE   NEG         Influenza B Ag  NEGATIVE   NEG         Source  Nasopharyngeal          LACTIC ACID          Collection Time: 03/01/19 10:31 AM         Result  Value  Ref Range            Lactic acid  4.4 (HH)  0.4 - 2.0 MMOL/L       URINALYSIS W/ RFLX MICROSCOPIC          Collection Time: 03/01/19 12:49 PM         Result  Value  Ref Range            Color  AMBER          Appearance  CLEAR          Specific gravity  1.010  1.001 - 1.023         pH (UA)  6.0  5.0 - 9.0         Protein  NEGATIVE   NEG mg/dL       Glucose  NEGATIVE   mg/dL       Ketone  NEGATIVE   NEG mg/dL       Bilirubin  MODERATE (A)  NEG  Blood  NEGATIVE   NEG         Urobilinogen  4.0 (H)  0.2 - 1.0 EU/dL       Nitrites  NEGATIVE   NEG         Leukocyte Esterase  NEGATIVE   NEG         DRUG SCREEN, URINE          Collection Time: 03/01/19 12:49 PM         Result  Value  Ref Range            PCP(PHENCYCLIDINE)  NEGATIVE           BENZODIAZEPINES  NEGATIVE           COCAINE  NEGATIVE           AMPHETAMINES  POSITIVE          METHADONE  NEGATIVE           THC (TH-CANNABINOL)  NEGATIVE           OPIATES  NEGATIVE           BARBITURATES  NEGATIVE           ACETAMINOPHEN          Collection Time: 03/01/19  3:00 PM         Result  Value  Ref Range            Acetaminophen level  <10 (L)  10.0 - 30.0 ug/mL       BILIRUBIN, DIRECT          Collection Time: 03/01/19  3:00 PM         Result  Value  Ref Range            Bilirubin, direct  4.0 (H)  <0.4 MG/DL       TRANSFERRIN SATURATION          Collection Time: 03/01/19  3:00 PM         Result  Value   Ref Range            Iron  68  35 - 150 ug/dL       TIBC  168 (L)  250 - 450 ug/dL       Transferrin Saturation  40  >20 %       FERRITIN          Collection Time: 03/01/19  3:00 PM         Result  Value  Ref Range            Ferritin  411 (H)  8 - 388 NG/ML       LACTIC ACID          Collection Time: 03/01/19  3:00 PM         Result  Value  Ref Range            Lactic acid  2.6 (HH)  0.4 - 2.0 MMOL/L       LD          Collection Time: 03/01/19  3:00 PM         Result  Value  Ref Range            LD  215 (H)  100 - 190 U/L       OCCULT BLOOD, STOOL          Collection Time: 03/01/19  5:51 PM         Result  Value  Ref Range            Occult blood, stool  NEGATIVE   NEG         AMMONIA          Collection Time: 03/01/19  9:23 PM         Result  Value  Ref Range            Ammonia  22  11 - 32 UMOL/L       METABOLIC PANEL, BASIC          Collection Time: 03/02/19  6:01 AM         Result  Value  Ref Range            Sodium  139  136 - 145 mmol/L       Potassium  3.7  3.5 - 5.1 mmol/L       Chloride  107  98 - 107 mmol/L       CO2  23  21 - 32 mmol/L       Anion gap  9  7 - 16 mmol/L       Glucose  104 (H)  65 - 100 mg/dL       BUN  11  6 - 23 MG/DL       Creatinine  0.94  0.8 - 1.5 MG/DL       GFR est AA  >60  >60 ml/min/1.49m       GFR est non-AA  >60  >60 ml/min/1.722m      Calcium  7.5 (L)  8.3 - 10.4 MG/DL       CBC WITH AUTOMATED DIFF          Collection Time: 03/02/19  6:01 AM         Result  Value  Ref Range            WBC  2.9 (L)  4.3 - 11.1 K/uL       RBC  3.25 (L)  4.23 - 5.6 M/uL       HGB  9.8 (L)  13.6 - 17.2 g/dL       HCT  29.5 (L)  41.1 - 50.3 %       MCV  90.8  79.6 - 97.8 FL       MCH  30.2  26.1 - 32.9 PG       MCHC  33.2  31.4 - 35.0 g/dL       RDW  14.8 (H)  11.9 - 14.6 %       PLATELET  122 (L)  150 - 450 K/uL       MPV  12.3  9.4 - 12.3 FL       ABSOLUTE NRBC  0.00  0.0 - 0.2 K/uL       DF  PENDING         HEPATIC FUNCTION PANEL          Collection Time: 03/02/19  6:01 AM         Result   Value  Ref Range            Protein, total  5.7 (L)  6.3 - 8.2 g/dL       Albumin  1.7 (L)  3.5 - 5.0 g/dL       Globulin  4.0 (H)  2.3 - 3.5 g/dL       A-G Ratio  0.4 (L)  1.2 -  3.5         Bilirubin, total  4.2 (H)  0.2 - 1.1 MG/DL       Bilirubin, direct  3.7 (H)  <0.4 MG/DL       Alk. phosphatase  170 (H)  50 - 136 U/L       AST (SGOT)  598 (H)  15 - 37 U/L            ALT (SGPT)  683 (H)  12 - 65 U/L              All Micro Results               Procedure  Component  Value  Units  Date/Time           CULTURE, BLOOD [[id number]]  Collected:  03/01/19 1235            Order Status:  Completed  Specimen:  Blood  Updated:  03/01/19 1400           CULTURE, BLOOD [[id number]]  Collected:  03/01/19 1235            Order Status:  Completed  Specimen:  Blood  Updated:  03/01/19 1400           INFLUENZA A & B AG (RAPID TEST) [[id number]]  Collected:  03/01/19 0918            Order Status:  Completed  Specimen:  Nasopharyngeal from Nasal washing  Updated:  03/01/19 0939                Influenza A Ag  NEGATIVE                   Comment:  NEGATIVE FOR THE PRESENCE OF INFLUENZA A ANTIGEN   INFECTION DUE TO INFLUENZA A CANNOT BE RULED OUT.   BECAUSE THE ANTIGEN PRESENT IN THE SAMPLE MAY BE BELOW   THE DETECTION LIMIT OF THE TEST.   A NEGATIVE TEST IS PRESUMPTIVE AND IT IS RECOMMENDED THAT THESE RESULTS BE CONFIRMED BY VIRAL CULTURE OR AN FDA-CLEARED INFLUENZA A AND B MOLECULAR ASSAY.                             Influenza B Ag  NEGATIVE                   Comment:  NEGATIVE FOR THE PRESENCE OF INFLUENZA B ANTIGEN   INFECTION DUE TO INFLUENZA B CANNOT BE RULED OUT.   BECAUSE THE ANTIGEN PRESENT IN THE SAMPLE MAY BE BELOW   THE DETECTION LIMIT OF THE TEST.   A NEGATIVE TEST IS PRESUMPTIVE AND IT IS RECOMMENDED THAT THESE RESULTS BE CONFIRMED BY VIRAL CULTURE OR AN FDA-CLEARED INFLUENZA A AND B MOLECULAR ASSAY.                             Source  Nasopharyngeal                     Current Meds:     Current Facility-Administered  Medications          Medication  Dose  Route  Frequency           ?  acetaminophen (TYLENOL) tablet 650 mg   650 mg  Oral  Q6H PRN     ?  sodium chloride (NS) flush 5-40  mL   5-40 mL  IntraVENous  Q8H     ?  sodium chloride (NS) flush 5-40 mL   5-40 mL  IntraVENous  PRN     ?  diphenhydrAMINE (BENADRYL) capsule 25 mg   25 mg  Oral  Q4H PRN     ?  ondansetron (ZOFRAN) injection 4 mg   4 mg  IntraVENous  Q4H PRN     ?  senna-docusate (PERICOLACE) 8.6-50 mg per tablet 2 Tab   2 Tab  Oral  DAILY PRN     ?  heparin (porcine) injection 5,000 Units   5,000 Units  SubCUTAneous  Q8H     ?  albuterol-ipratropium (DUO-NEB) 2.5 MG-0.5 MG/3 ML   3 mL  Nebulization  Q6H PRN     ?  piperacillin-tazobactam (ZOSYN) 3.375 g in 0.9% sodium chloride (MBP/ADV) 100 mL   3.375 g  IntraVENous  Q8H     ?  morphine injection 1 mg   1 mg  IntraVENous  Q4H PRN           ?  influenza vaccine 2019-20 (6 mos+)(PF) (FLUARIX/FLULAVAL/FLUZONE QUAD) injection 0.5 mL   0.5 mL  IntraMUSCular  PRIOR TO DISCHARGE           Other Studies:      [place] Abd Comp      Result Date: 03/01/2019   ABDOMINAL ULTRASOUND. HISTORY: Elevated liver enzymes, jaundice and abdominal pain. COMPARISON: None FINDINGS: Pancreas is grossly unremarkable although the tail is partially obscured by overlying bowel gas.. Aorta is normal caliber, 2.6 cm. The IVC is  patent. The enlarged spleen has a homogenous echotexture and measures 23 cm. Left kidney measures 15.1 cm included upper pole cyst calcification. No hydronephrosis. Right kidney is unremarkable without hydronephrosis and measures 13.6 cm. Renal echogenicity  is normal, the right kidney is hypoechoic to the liver. The intrahepatic biliary tree is not dilated. There is hepatopetal flow in the portal vein. Small echogenic hemangioma right lobe liver, 9 x 13 mm. The gallbladder wall is thickened, up to 8 mm.  Multiple small echogenic shadowing gallstones. The common bile duct is not dilated, 6 mm.       IMPRESSION: Cholelithiasis  with gallbladder wall thickening, possibly acute or chronic cholecystitis. No biliary tree obstruction.       Xr Chest Port      Result Date: 03/01/2019   1 View portable chest x-ray 03/01/2019 8:32 AM Indication: Chest pain Comparison: 12/08/2010 Findings: This portable upright AP chest of 0809 shows the monitoring leads overlying the chest. Lung volumes are low, only slight crowding though suggested at  the bases. Cardiomediastinal silhouette within normal limits. No confluent infiltrate, no significant effusion. Vascularity seems appropriate.       IMPRESSION: Mild pulmonary hypoinflation. Otherwise no acute infiltrate suggested.                Assessment and Plan:           Hospital Problems as of  03/02/2019  Date Reviewed:  03/01/2019                         Codes  Class  Noted - Resolved  POA              Pancytopenia ([place])  ICD-10-CM: F09.323   ICD-9-CM: 284.19    03/02/2019 - Present  Unknown  Jaundice  ICD-10-CM: R17   ICD-9-CM: 782.4    03/01/2019 - Present  Unknown                        CAD (coronary artery disease)  ICD-10-CM: I25.10   ICD-9-CM: 414.00    03/01/2019 - Present  Unknown                        COPD  ICD-9-CM: 496    03/01/2019 - Present  Unknown                        Normocytic anemia  ICD-10-CM: D64.9   ICD-9-CM: 285.9    03/01/2019 - Present  Unknown                        Lactic acidosis  ICD-10-CM: E87.2   ICD-9-CM: 276.2    03/01/2019 - Present  Unknown                        * (Principal) Elevated liver enzymes  ICD-10-CM: R74.8   ICD-9-CM: 790.5    03/01/2019 - Present  Unknown                          Plan:      Mr. 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    ??   Abdominal Pain, Jaundice, Hepatocellular pattern of elevated Liver Enzymes    Cholecystitis due to cholelithiasis   - LFTs down trending.    - Normal tylenol level but drug screen for amphetamines.    - ultrasound Abd :  Cholelithiasis within the gallbladder with wall thickening.  No biliary tree obstruction.   - Hepatitis panel, HIV pending   - zosyn (3/7)   - GI consult pending.   ??   Lactic acidosis: improving   - elevated procalc, fatigue with subjected fevers at home. abdominal exam is not impressive for bowel ischemia   - Afrebril, no leukocytosis on arrival.  CXR showed no acute infectious process. 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 Along with splenomegaly, elevated LDH likely be causing destruction in the enlarged spleen.   - stool occult neg.   - peripheral smear   ??   MI s/p stents (3-451yrago)   - not on any meds   ??   COPD   - not on any meds   - nebs prn   - o2 supplement as needed      Headache S/p Fall (prior to admission)   - did not hit his head or LOC per patient   - CT Head w/o contrast      Diet:  DIET REGULAR   DIET NUTRITIONAL SUPPLEMENTS   DVT PPx: heparin sq   Code: Full Code         Medical Decision Making:      Labs/Imaging reviewed.   Additional information obtained from nursing staff.   Patient  is high risk due to medical condition and comorbidities.   Plan discussed with nursing staff.   Plan discussed with patient/family.  All questions/concerns were addressed. Pt/family agrees with the plan.                Signed By:  Helmut Muster, MD           March 02, 2019

## 2019-03-02 NOTE — Progress Notes (Signed)
Pt c/o of hiccups that started last night and have not ceased. Pt requesting something to help ease them. Paging hospitalist.

## 2019-03-02 NOTE — Progress Notes (Signed)
Pt irate because one of the phlebotomists angered him. Pt states "that bastard Nadine Counts coming in here trying to stick me." pt did not let this phlebotomist collect lactic acid. Another phlebotomist came. Pt will not let Kim phlebotomist collect lactic acid. This nurse stressed the importance of these labs in order to provide proper care while he is in hospital. Pt seems to be agreeable to having lab drawn in an hour. Pt states "let me rest a bit and maybe I'll have enough blood to give."    Pt also c/o not receiving pain medication and still feeling nauseous. This nurse has educated patient that is unsafe to administer morphine while BP is borderline hypotensive. Pt states "no it hasn't been!" Pt does not seem to be receptive to this information. Pt demanding his medication. Pt states he is still throwing up despite having been observed resting in bed with door open after dose of phenergan was administered. This nurse incapable of reasoning with patient at the moment.   Oncoming nurse made aware.

## 2019-03-02 NOTE — Progress Notes (Signed)
Pt cannot have MRI completed today due to MRI tech leaving early today. Pt will have MRI done in morning. Pt will have to be NPO after MN. Will make ordering provider aware.

## 2019-03-02 NOTE — Progress Notes (Signed)
Pt continue to vomit. Paging hospitalist.   Order received for one time dose of phenergan.

## 2019-03-03 ENCOUNTER — Inpatient Hospital Stay: Payer: TRICARE (CHAMPUS) | Primary: Family Medicine

## 2019-03-03 LAB — CBC WITH AUTO DIFFERENTIAL
Basophils %: 0 % (ref 0.0–2.0)
Basophils Absolute: 0 10*3/uL (ref 0.0–0.2)
Eosinophils %: 2 % (ref 0.5–7.8)
Eosinophils Absolute: 0.1 10*3/uL (ref 0.0–0.8)
Granulocyte Absolute Count: 0 10*3/uL (ref 0.0–0.5)
Hematocrit: 31 % — ABNORMAL LOW (ref 41.1–50.3)
Hemoglobin: 9.9 g/dL — ABNORMAL LOW (ref 13.6–17.2)
Immature Granulocytes: 1 % (ref 0.0–5.0)
Lymphocytes %: 39 % (ref 13–44)
Lymphocytes Absolute: 1.3 10*3/uL (ref 0.5–4.6)
MCH: 30.2 PG (ref 26.1–32.9)
MCHC: 31.9 g/dL (ref 31.4–35.0)
MCV: 94.5 FL (ref 79.6–97.8)
MPV: 12.2 FL (ref 9.4–12.3)
Monocytes %: 10 % (ref 4.0–12.0)
Monocytes Absolute: 0.3 10*3/uL (ref 0.1–1.3)
NRBC Absolute: 0 10*3/uL (ref 0.0–0.2)
Neutrophils %: 48 % (ref 43–78)
Neutrophils Absolute: 1.7 10*3/uL (ref 1.7–8.2)
Platelets: 132 10*3/uL — ABNORMAL LOW (ref 150–450)
RBC: 3.28 M/uL — ABNORMAL LOW (ref 4.23–5.6)
RDW: 15.3 % — ABNORMAL HIGH (ref 11.9–14.6)
WBC: 3.4 10*3/uL — ABNORMAL LOW (ref 4.3–11.1)

## 2019-03-03 LAB — EKG 12-LEAD
Atrial Rate: 67 {beats}/min
Diagnosis: NORMAL
P Axis: 60 degrees
P-R Interval: 158 ms
Q-T Interval: 414 ms
QRS Duration: 114 ms
QTc Calculation (Bazett): 437 ms
R Axis: 1 degrees
T Axis: 14 degrees
Ventricular Rate: 67 {beats}/min

## 2019-03-03 LAB — BASIC METABOLIC PANEL
Anion Gap: 8 mmol/L (ref 7–16)
BUN: 12 MG/DL (ref 6–23)
CO2: 22 mmol/L (ref 21–32)
Calcium: 7.6 MG/DL — ABNORMAL LOW (ref 8.3–10.4)
Chloride: 112 mmol/L — ABNORMAL HIGH (ref 98–107)
Creatinine: 0.81 MG/DL (ref 0.8–1.5)
EGFR IF NonAfrican American: >60 mL/min/{1.73_m2} (ref >60)
GFR African American: >60 mL/min/{1.73_m2} (ref >60)
Glucose: 90 mg/dL (ref 65–100)
Potassium: 3.8 mmol/L (ref 3.5–5.1)
Sodium: 142 mmol/L (ref 136–145)

## 2019-03-03 LAB — HEPATIC FUNCTION PANEL
A-G Ratio: 0.4 — ABNORMAL LOW (ref 1.2–3.5)
ALT (SGPT): 572 U/L — ABNORMAL HIGH (ref 12–65)
ALT: 572 U/L — ABNORMAL HIGH (ref 12–65)
AST (SGOT): 491 U/L — ABNORMAL HIGH (ref 15–37)
AST: 491 U/L — ABNORMAL HIGH (ref 15–37)
Albumin/Globulin Ratio: 0.4 — ABNORMAL LOW (ref 1.2–3.5)
Albumin: 1.8 g/dL — ABNORMAL LOW (ref 3.5–5.0)
Albumin: 1.8 g/dL — ABNORMAL LOW (ref 3.5–5.0)
Alk. phosphatase: 187 U/L — ABNORMAL HIGH (ref 50–136)
Alkaline Phosphatase: 187 U/L — ABNORMAL HIGH (ref 50–136)
Bilirubin, Direct: 3.8 MG/DL — ABNORMAL HIGH (ref <0.4)
Bilirubin, direct: 3.8 MG/DL — ABNORMAL HIGH (ref <0.4)
Bilirubin, total: 4.4 MG/DL — ABNORMAL HIGH (ref 0.2–1.1)
Globulin: 4.1 g/dL — ABNORMAL HIGH (ref 2.3–3.5)
Globulin: 4.1 g/dL — ABNORMAL HIGH (ref 2.3–3.5)
Protein, total: 5.9 g/dL — ABNORMAL LOW (ref 6.3–8.2)
Total Bilirubin: 4.4 MG/DL — ABNORMAL HIGH (ref 0.2–1.1)
Total Protein: 5.9 g/dL — ABNORMAL LOW (ref 6.3–8.2)

## 2019-03-03 LAB — BLOOD CULTURE ID PANEL
MECA (METHICILLIN-RESISTANCE GENES): NOT DETECTED
STAPHYLOCOCCUS: DETECTED — AB
Staphylococcus: DETECTED — AB
mecA (Methicillin-Resistance Genes): NOT DETECTED

## 2019-03-03 LAB — APTT: aPTT: 41.6 s — ABNORMAL HIGH (ref 24.3–35.4)

## 2019-03-03 LAB — PROTIME-INR
INR: 1.2
Protime: 15.8 s — ABNORMAL HIGH (ref 12.0–14.7)

## 2019-03-03 LAB — TROPONIN: Troponin I: <0.02 NG/ML — ABNORMAL LOW (ref 0.02–0.05)

## 2019-03-03 LAB — HAPTOGLOBIN
HAPTOGLOBIN, HAPGB: 109 mg/dL (ref 30–200)
Haptoglobin: 109 mg/dL (ref 30–200)

## 2019-03-03 LAB — EKG, 12 LEAD, SUBSEQUENT
Atrial Rate: 67 {beats}/min
Calculated P Axis: 60 degrees
Calculated R Axis: 1 degrees
Calculated T Axis: 14 degrees
Diagnosis: NORMAL
P-R Interval: 158 ms
Q-T Interval: 414 ms
QRS Duration: 114 ms
QTC Calculation (Bezet): 437 ms
Ventricular Rate: 67 {beats}/min

## 2019-03-03 LAB — CBC WITH AUTOMATED DIFF
ABS. BASOPHILS: 0 10*3/uL (ref 0.0–0.2)
ABS. EOSINOPHILS: 0.1 10*3/uL (ref 0.0–0.8)
ABS. IMM. GRANS.: 0 10*3/uL (ref 0.0–0.5)
ABS. LYMPHOCYTES: 1.3 10*3/uL (ref 0.5–4.6)
ABS. MONOCYTES: 0.3 10*3/uL (ref 0.1–1.3)
ABS. NEUTROPHILS: 1.7 10*3/uL (ref 1.7–8.2)
ABSOLUTE NRBC: 0 10*3/uL (ref 0.0–0.2)
BASOPHILS: 0 % (ref 0.0–2.0)
EOSINOPHILS: 2 % (ref 0.5–7.8)
HCT: 31 % — ABNORMAL LOW (ref 41.1–50.3)
HGB: 9.9 g/dL — ABNORMAL LOW (ref 13.6–17.2)
IMMATURE GRANULOCYTES: 1 % (ref 0.0–5.0)
LYMPHOCYTES: 39 % (ref 13–44)
MCH: 30.2 PG (ref 26.1–32.9)
MCHC: 31.9 g/dL (ref 31.4–35.0)
MCV: 94.5 FL (ref 79.6–97.8)
MONOCYTES: 10 % (ref 4.0–12.0)
MPV: 12.2 FL (ref 9.4–12.3)
NEUTROPHILS: 48 % (ref 43–78)
PLATELET: 132 10*3/uL — ABNORMAL LOW (ref 150–450)
RBC: 3.28 M/uL — ABNORMAL LOW (ref 4.23–5.6)
RDW: 15.3 % — ABNORMAL HIGH (ref 11.9–14.6)
WBC: 3.4 10*3/uL — ABNORMAL LOW (ref 4.3–11.1)

## 2019-03-03 LAB — METABOLIC PANEL, BASIC
Anion gap: 8 mmol/L (ref 7–16)
BUN: 12 MG/DL (ref 6–23)
CO2: 22 mmol/L (ref 21–32)
Calcium: 7.6 MG/DL — ABNORMAL LOW (ref 8.3–10.4)
Chloride: 112 mmol/L — ABNORMAL HIGH (ref 98–107)
Creatinine: 0.81 MG/DL (ref 0.8–1.5)
GFR est AA: >60 mL/min/{1.73_m2} (ref >60)
GFR est non-AA: >60 mL/min/{1.73_m2} (ref >60)
Glucose: 90 mg/dL (ref 65–100)
Potassium: 3.8 mmol/L (ref 3.5–5.1)
Sodium: 142 mmol/L (ref 136–145)

## 2019-03-03 LAB — PROTHROMBIN TIME + INR
INR: 1.2
Prothrombin time: 15.8 s — ABNORMAL HIGH (ref 12.0–14.7)

## 2019-03-03 LAB — PLEASE READ & DOCUMENT PPD TEST IN 24 HRS
PPD: NEGATIVE Negative
mm Induration: 0 mm (ref 0–5)

## 2019-03-03 LAB — TROPONIN I: Troponin-I, Qt.: <0.02 NG/ML — ABNORMAL LOW (ref 0.02–0.05)

## 2019-03-03 LAB — PTT: aPTT: 41.6 s — ABNORMAL HIGH (ref 24.3–35.4)

## 2019-03-03 MED ORDER — SODIUM CHLORIDE 0.9 % INJECTION
25 mg/mL | Freq: Four times a day (QID) | INTRAMUSCULAR | Status: DC | PRN
Start: 2019-03-03 — End: 2019-03-05
  Administered 2019-03-03 – 2019-03-05 (×5): via INTRAVENOUS

## 2019-03-03 MED ORDER — SODIUM CHLORIDE 0.9 % INJECTION
40 mg | Freq: Once | INTRAMUSCULAR | Status: AC
Start: 2019-03-03 — End: 2019-03-03
  Administered 2019-03-03: 22:00:00 via INTRAVENOUS

## 2019-03-03 MED ORDER — VANCOMYCIN IN 0.9 % SODIUM CHLORIDE 2 GRAM/500 ML IV
2 gram/500 mL | Freq: Once | INTRAVENOUS | Status: DC
Start: 2019-03-03 — End: 2019-03-03

## 2019-03-03 MED ORDER — VANCOMYCIN IN 0.9% SODIUM CHLORIDE 1.5 G/500 ML IV
1.5 g/500 mL | Freq: Two times a day (BID) | INTRAVENOUS | Status: DC
Start: 2019-03-03 — End: 2019-03-03

## 2019-03-03 MED ORDER — SODIUM CHLORIDE 0.9 % IV
INTRAVENOUS | Status: AC
Start: 2019-03-03 — End: 2019-03-04
  Administered 2019-03-03 (×2): via INTRAVENOUS

## 2019-03-03 MED ORDER — DIAZEPAM 5 MG TAB
5 mg | Freq: Once | ORAL | Status: DC | PRN
Start: 2019-03-03 — End: 2019-03-04

## 2019-03-03 MED ORDER — PANTOPRAZOLE 40 MG TAB, DELAYED RELEASE
40 mg | Freq: Every day | ORAL | Status: DC
Start: 2019-03-03 — End: 2019-03-05
  Administered 2019-03-04 – 2019-03-05 (×4): via ORAL

## 2019-03-03 MED ORDER — VANCOMYCIN IN 0.9 % SODIUM CHLORIDE 2.5 GRAM/500 ML IV
2.5 gram/500 mL | Freq: Once | INTRAVENOUS | Status: AC
Start: 2019-03-03 — End: 2019-03-03
  Administered 2019-03-03: 07:00:00 via INTRAVENOUS

## 2019-03-03 MED ORDER — TUBERCULIN PPD 5 UNIT/0.1 ML INTRADERMAL
5 tub. unit /0.1 mL | Freq: Once | INTRADERMAL | Status: DC
Start: 2019-03-03 — End: 2019-03-03

## 2019-03-03 MED ORDER — DIAZEPAM 5 MG TAB
5 mg | Freq: Once | ORAL | Status: AC
Start: 2019-03-03 — End: 2019-03-03
  Administered 2019-03-03: 16:00:00 via ORAL

## 2019-03-03 MED FILL — PROMETHAZINE 25 MG/ML INJECTION: 25 mg/mL | INTRAMUSCULAR | Qty: 1

## 2019-03-03 MED FILL — PIPERACILLIN-TAZOBACTAM 3.375 GRAM IV SOLR: 3.375 gram | INTRAVENOUS | Qty: 3.38

## 2019-03-03 MED FILL — MORPHINE 2 MG/ML INJECTION: 2 mg/mL | INTRAMUSCULAR | Qty: 1

## 2019-03-03 MED FILL — HEARTBURN RELIEF (FAMOTIDINE) 10 MG TABLET: 10 mg | ORAL | Qty: 2

## 2019-03-03 MED FILL — HEPARIN (PORCINE) 5,000 UNIT/ML IJ SOLN: 5000 unit/mL | INTRAMUSCULAR | Qty: 1

## 2019-03-03 MED FILL — SODIUM CHLORIDE 0.9 % IV: INTRAVENOUS | Qty: 1000

## 2019-03-03 MED FILL — PANTOPRAZOLE 40 MG IV SOLR: 40 mg | INTRAVENOUS | Qty: 40

## 2019-03-03 MED FILL — ONDANSETRON (PF) 4 MG/2 ML INJECTION: 4 mg/2 mL | INTRAMUSCULAR | Qty: 2

## 2019-03-03 MED FILL — DIAZEPAM 5 MG TAB: 5 mg | ORAL | Qty: 1

## 2019-03-03 MED FILL — VANCOMYCIN IN 0.9 % SODIUM CHLORIDE 2.5 GRAM/500 ML IV: 2.5 gram/500 mL | INTRAVENOUS | Qty: 500

## 2019-03-03 MED FILL — VANCOMYCIN IN 0.9 % SODIUM CHLORIDE 2 GRAM/500 ML IV: 2 gram/500 mL | INTRAVENOUS | Qty: 500

## 2019-03-03 NOTE — Progress Notes (Signed)
Problem: Falls - Risk of  Goal: *Absence of Falls  Description  Document Schmid Fall Risk and appropriate interventions in the flowsheet.  Outcome: Progressing Towards Goal  Note: Fall Risk Interventions:  Mobility Interventions: Bed/chair exit alarm         Medication Interventions: Bed/chair exit alarm    Elimination Interventions: Bed/chair exit alarm    History of Falls Interventions: Bed/chair exit alarm         Problem: Patient Education: Go to Patient Education Activity  Goal: Patient/Family Education  Outcome: Progressing Towards Goal     Problem: Nutrition Deficit  Goal: *Optimize nutritional status  Outcome: Progressing Towards Goal

## 2019-03-03 NOTE — Progress Notes (Signed)
Received call from GI PA, Sabrina, that patient said he has had chest pain since admission. Patient has not mentioned this today nor to night shift RN who gave shift report.  Patient has only complained of abdominal pain today. Dr. Aung made aware.

## 2019-03-03 NOTE — Progress Notes (Signed)
[facility] to [facility] to get [facility] with a [facility] physician. Stacey stated the patient would have to call to schedule the appointment.  CM explained patient is currently homeless and does not have access to a telephone outside of the hospital.  Stacey stated she would have someone reach out to patient to get an appointment set up.  Stacey also provided CM with the telephone number and 2 extensions for Social Workers within the [facility] for the homeless assistance program.  Stacey stated [facility] has a 29 bed facility for veterans and offer transportation, section 8 vouchers, food, etc.  Stacey explained to CM PPD would need to be placed or a chest x-ray performed.  Stacey also explained patient would need to call as they have to perform a phone screening. CM provided patient with the number for the homeless assistance program [phone] ext [phone] Carolyn Golson or ext. [phone] Marsha Bellinger- [place].  CM explained to patient the number is long distance so he will need to call for his nurse to make the call as there is a code needed. Patient verbalized understanding.

## 2019-03-03 NOTE — Progress Notes (Signed)
Pharmacokinetic Consult to Pharmacist    Vincent Smith is a 58 y.o. male being treated for bloodstream infection with vancomycin.    Height: 5\' 10"  (177.8 cm)  Weight: 86.2 kg (190 lb)  Lab Results   Component Value Date/Time    BUN 11 03/02/2019 06:01 AM    Creatinine 0.94 03/02/2019 06:01 AM    WBC 2.9 (L) 03/02/2019 06:01 AM    Procalcitonin 3.44 03/01/2019 08:11 AM    Lactic acid 2.5 (HH) 03/02/2019 03:15 PM      Estimated Creatinine Clearance: 88.4 mL/min (based on SCr of 0.94 mg/dL).    CULTURES:  Results     Procedure Component Value Units Date/Time    CULTURE, BLOOD [[id number]]     Order Status:  Sent Specimen:  Blood     CULTURE, BLOOD [[id number]]     Order Status:  Sent Specimen:  Blood     CULTURE, BLOOD [[id number]]     Order Status:  Canceled Specimen:  Blood     CULTURE, BLOOD [[id number]]     Order Status:  Canceled Specimen:  Blood     CULTURE, BLOOD [[id number]] Collected:  03/01/19 1335    Order Status:  Completed Specimen:  Blood Updated:  03/02/19 1347     Special Requests: --        RIGHT  Antecubital       Culture result: NO GROWTH AFTER 23 HOURS       CULTURE, BLOOD [[id number]] Collected:  03/01/19 1335    Order Status:  Completed Specimen:  Blood Updated:  03/03/19 0206     Special Requests: --        RIGHT  FOREARM       GRAM STAIN GRAM POSITIVE COCCI         ANAEROBIC BOTTLE POSITIVE               RESULTS VERIFIED, PHONED TO AND READ BACK BY T.LITTLETON,RN @ 0157 03/03/19 MM           Culture result:       CULTURE IN PROGRESS,FURTHER UPDATES TO FOLLOW            REFER TO BIOFIRE [id number]    BLOOD CULTURE ID PANEL [[id number]] Collected:  03/01/19 1335    Order Status:  Completed Updated:  03/03/19 0106    INFLUENZA A & B AG (RAPID TEST) [[id number]] Collected:  03/01/19 0918    Order Status:  Completed Specimen:  Nasopharyngeal from Nasal washing Updated:  03/01/19 0939     Influenza A Ag NEGATIVE         Comment: NEGATIVE FOR THE PRESENCE OF INFLUENZA A ANTIGEN   INFECTION DUE TO INFLUENZA A CANNOT BE RULED OUT.  BECAUSE THE ANTIGEN PRESENT IN THE SAMPLE MAY BE BELOW  THE DETECTION LIMIT OF THE TEST.  A NEGATIVE TEST IS PRESUMPTIVE AND IT IS RECOMMENDED THAT THESE RESULTS BE CONFIRMED BY VIRAL CULTURE OR AN FDA-CLEARED INFLUENZA A AND B MOLECULAR ASSAY.          Influenza B Ag NEGATIVE         Comment: NEGATIVE FOR THE PRESENCE OF INFLUENZA B ANTIGEN  INFECTION DUE TO INFLUENZA B CANNOT BE RULED OUT.  BECAUSE THE ANTIGEN PRESENT IN THE SAMPLE MAY BE BELOW  THE DETECTION LIMIT OF THE TEST.  A NEGATIVE TEST IS PRESUMPTIVE AND IT IS RECOMMENDED THAT THESE RESULTS BE CONFIRMED BY VIRAL CULTURE OR AN FDA-CLEARED INFLUENZA A AND B MOLECULAR ASSAY.  Source Nasopharyngeal               Day 1 of vancomycin.  Goal trough is 15-20.  Vancomycin dose initiated at 2,500 mg load, followed by 1,500 mg q12h.   Will continue to follow patient.      Thank you,  Ernst Spell, PharmD

## 2019-03-03 NOTE — Progress Notes (Signed)
Hourly rounds completed this shift. Patient complained of pain, nausea, & vomiting. Patient medicated per MAR. All needs met at this time. Will continue to monitor & give report to oncoming RN.

## 2019-03-03 NOTE — Progress Notes (Signed)
Patient refused ativan prior to MRI saying "it does not work".  Dr. Aung aware and ordered 1x dose of valium 5 mg.  MRI aware and will call when they can rescheduled procedure for today.

## 2019-03-03 NOTE — Progress Notes (Signed)
MRI called and said patient could not tolerate entire MRI exam, despite medication given prior to procedure at patient's request.  Will make MD aware.

## 2019-03-03 NOTE — Progress Notes (Signed)
Gastroenterology    Addendum to note from earlier today:  PE  Abd: possible umbilical hernia noted, with fullness and tenderness over the umbilicus.    Alaina Donati S. Damary Doland, PA-C  [facility]

## 2019-03-03 NOTE — Progress Notes (Addendum)
Hourly rounds performed.  All needs meet.  Nausea and pain managed per MAR.  Patient did not tolerate dinner well, ate 25% and then vomited. Nausea and vomiting, and pain managed per MAR.    Bed low/locked.  Call light within reach.  Patient denies needs at this time.  Will continue to monitor and report to oncoming RN

## 2019-03-03 NOTE — Progress Notes (Signed)
Hospitalist Note     Admit Date:  03/01/2019  8:04 AM   Name:  Vincent Smith   Age:  58 y.o.  DOB:  May 21, 1961   MRN:  [id number]   PCP:  Lacey Jensen, MD  Treatment Team: Attending Provider: Helmut Muster, MD; Consulting Provider: Flint Melter, MD; Utilization Review: Loyce Dys, RN; Primary Nurse: Madie Reno, Beverley Fiedler, RN    HPI/Subjective:   Patient is seen and examined at bedside.    Overnight, patient was found in the bathroom but alert and oriented. He did not hit his head.   He was started on vanc IV as 1/2 BCx positive for GPC.  Biofire panel showed coag neg staph likely contaminant.    Reports being claustrophobic and needing meds for MRI. States he needed valium last time to complete MRI. Patient is AAOx3 but lethargic which has not changed from prior days.   He is tolerating PO and having BM but also having episodes of nausea despite zofran and phenegran (helps better).    MRCP pending today.    Objective:     Patient Vitals for the past 24 hrs:   Temp Pulse Resp BP SpO2   03/03/19 0505 97.6 ??F (36.4 ??C) 75 19 144/51 99 %   03/03/19 0155 ??? ??? ??? 123/64 ???   03/03/19 0040 ??? ??? ??? 103/64 ???   03/03/19 0030 98.4 ??F (36.9 ??C) 70 18 (!) 83/42 98 %   03/02/19 2211 98.2 ??F (36.8 ??C) 74 18 94/54 98 %   03/02/19 1956 98.3 ??F (36.8 ??C) 79 18 110/60 100 %   03/02/19 1555 98.2 ??F (36.8 ??C) 72 17 90/59 99 %   03/02/19 1235 98.3 ??F (36.8 ??C) 66 18 99/52 99 %   03/02/19 0825 97.7 ??F (36.5 ??C) 74 17 107/70 97 %     Oxygen Therapy  O2 Sat (%): 99 % (03/03/19 0505)  Pulse via Oximetry: 67 beats per minute (03/01/19 1300)  O2 Device: Room air (03/01/19 0811)    Estimated body mass index is 28.45 kg/m?? as calculated from the following:    Height as of 03/02/19: 5' 10"  (1.778 m).    Weight as of this encounter: 89.9 kg (198 lb 4.8 oz).      Intake/Output Summary (Last 24 hours) at 03/03/2019 0731  Last data filed at 03/03/2019 0610  Gross per 24 hour   Intake 2549.16 ml   Output 590 ml   Net 1959.16 ml        *Note that automatically entered I/Os may not be accurate; dependent on patient compliance with collection and accurate data entry by techs.    Physical Exam:     General:                   alert, awake, jaundice, ill appearing   Head:                        normocephalic, atraumatic  Eyes, Ears, nose:    PERRL, EOMI. Sclera icterus  Neck:                         supple, non-tender   Lungs:                       Crackles on the bases without any wheezing.  Cardiac:  RRR, Normal S1 and S2.    Abdomen:                  Soft, distended, TTP diffusely without guarding  Extremities:               Warm, dry. No edema   Skin:                           No rashes, no jaundice  Neuro:              Alert and oriented to person, time, place, situation. No gross focal neurological deficit  Psychiatric:                No anxiety, calm, cooperative        Data Review:  I have reviewed all labs, meds, and studies from the last 24 hours:    Recent Results (from the past 24 hour(s))   LACTIC ACID    Collection Time: 03/02/19  8:08 AM   Result Value Ref Range    Lactic acid 2.2 (HH) 0.4 - 2.0 MMOL/L   LACTIC ACID    Collection Time: 03/02/19  3:15 PM   Result Value Ref Range    Lactic acid 2.5 (HH) 0.4 - 2.0 MMOL/L   METABOLIC PANEL, BASIC    Collection Time: 03/03/19  5:26 AM   Result Value Ref Range    Sodium 142 136 - 145 mmol/L    Potassium 3.8 3.5 - 5.1 mmol/L    Chloride 112 (H) 98 - 107 mmol/L    CO2 22 21 - 32 mmol/L    Anion gap 8 7 - 16 mmol/L    Glucose 90 65 - 100 mg/dL    BUN 12 6 - 23 MG/DL    Creatinine 0.81 0.8 - 1.5 MG/DL    GFR est AA >60 >60 ml/min/1.92m    GFR est non-AA >60 >60 ml/min/1.772m   Calcium 7.6 (L) 8.3 - 10.4 MG/DL   CBC WITH AUTOMATED DIFF    Collection Time: 03/03/19  5:26 AM   Result Value Ref Range    WBC 3.4 (L) 4.3 - 11.1 K/uL    RBC 3.28 (L) 4.23 - 5.6 M/uL    HGB 9.9 (L) 13.6 - 17.2 g/dL    HCT 31.0 (L) 41.1 - 50.3 %    MCV 94.5 79.6 - 97.8 FL    MCH 30.2 26.1 - 32.9 PG     MCHC 31.9 31.4 - 35.0 g/dL    RDW 15.3 (H) 11.9 - 14.6 %    PLATELET 132 (L) 150 - 450 K/uL    MPV 12.2 9.4 - 12.3 FL    ABSOLUTE NRBC 0.00 0.0 - 0.2 K/uL    NEUTROPHILS 48 43 - 78 %    LYMPHOCYTES 39 13 - 44 %    MONOCYTES 10 4.0 - 12.0 %    EOSINOPHILS 2 0.5 - 7.8 %    BASOPHILS 0 0.0 - 2.0 %    IMMATURE GRANULOCYTES 1 0.0 - 5.0 %    ABS. NEUTROPHILS 1.7 1.7 - 8.2 K/UL    ABS. LYMPHOCYTES 1.3 0.5 - 4.6 K/UL    ABS. MONOCYTES 0.3 0.1 - 1.3 K/UL    ABS. EOSINOPHILS 0.1 0.0 - 0.8 K/UL    ABS. BASOPHILS 0.0 0.0 - 0.2 K/UL    ABS. IMM. GRANS. 0.0 0.0 - 0.5 K/UL    RBC COMMENTS NORMOCYTIC/NORMOCHROMIC  WBC COMMENTS OCCASIONAL      PLATELET COMMENTS SLIGHT      DF AUTOMATED     PTT    Collection Time: 03/03/19  5:26 AM   Result Value Ref Range    aPTT 41.6 (H) 24.3 - 35.4 SEC   PROTHROMBIN TIME + INR    Collection Time: 03/03/19  5:26 AM   Result Value Ref Range    Prothrombin time 15.8 (H) 12.0 - 14.7 sec    INR 1.2     HEPATIC FUNCTION PANEL    Collection Time: 03/03/19  5:26 AM   Result Value Ref Range    Protein, total 5.9 (L) 6.3 - 8.2 g/dL    Albumin 1.8 (L) 3.5 - 5.0 g/dL    Globulin 4.1 (H) 2.3 - 3.5 g/dL    A-G Ratio 0.4 (L) 1.2 - 3.5      Bilirubin, total 4.4 (H) 0.2 - 1.1 MG/DL    Bilirubin, direct 3.8 (H) <0.4 MG/DL    Alk. phosphatase 187 (H) 50 - 136 U/L    AST (SGOT) 491 (H) 15 - 37 U/L    ALT (SGPT) 572 (H) 12 - 65 U/L        All Micro Results     Procedure Component Value Units Date/Time    CULTURE, BLOOD [[id number]] Collected:  03/01/19 1335    Order Status:  Completed Specimen:  Blood Updated:  03/03/19 0704     Special Requests: --        RIGHT  FOREARM       GRAM STAIN GRAM POSITIVE COCCI         ANAEROBIC BOTTLE POSITIVE               RESULTS VERIFIED, PHONED TO AND READ BACK BY T.LITTLETON,RN @ 0157 03/03/19 MM           Culture result:       CULTURE IN [place] UPDATES TO FOLLOW            REFER TO [place] PANEL [id number]     BLOOD CULTURE ID PANEL [[id number]]  (Abnormal) Collected:  03/01/19 1335    Order Status:  Completed Specimen:  Blood Updated:  03/03/19 0651     Acc. no. from Micro Order [id number]     Staphylococcus DETECTED        Comment: RESULTS VERIFIED, PHONED TO AND READ BACK BY  Royetta Car @ [phone] ON 03/03/2019 [place].          mecA (Methicillin-Resistance Genes) NOT DETECTED        INTERPRETATION       Gram positive cocci in clusters. Identified by realtime PCR as  Coagulase negative Staphylococci           Comment: A single positive culture of coagulase negative Staph is likely to be a contaminant in adult patients. Consider discontinuation of antibiotics for gram positive bloodstream infections if patient asymptomatic.  THIS TEST DOES NOT REPLACE SENSITIVITY TESTING.       CULTURE, BLOOD [[id number]] Collected:  03/03/19 0526    Order Status:  Completed Specimen:  Blood Updated:  03/03/19 0625    CULTURE, BLOOD [[id number]] Collected:  03/03/19 0530    Order Status:  Completed Specimen:  Blood Updated:  03/03/19 0625    CULTURE, BLOOD [[id number]]     Order Status:  Canceled Specimen:  Blood     CULTURE, BLOOD [[id number]]     Order Status:  Canceled Specimen:  Blood  CULTURE, BLOOD [[id number]] Collected:  03/01/19 1335    Order Status:  Completed Specimen:  Blood Updated:  03/02/19 1347     Special Requests: --        RIGHT  Antecubital       Culture result: NO GROWTH AFTER 23 HOURS       INFLUENZA A & B AG (RAPID TEST) [[id number]] Collected:  03/01/19 0918    Order Status:  Completed Specimen:  Nasopharyngeal from Nasal washing Updated:  03/01/19 0939     Influenza A Ag NEGATIVE         Comment: NEGATIVE FOR THE PRESENCE OF INFLUENZA A ANTIGEN  INFECTION DUE TO INFLUENZA A CANNOT BE RULED OUT.  BECAUSE THE ANTIGEN PRESENT IN THE SAMPLE MAY BE BELOW  THE DETECTION LIMIT OF THE TEST.  A NEGATIVE TEST IS PRESUMPTIVE AND IT IS RECOMMENDED THAT THESE RESULTS BE  CONFIRMED BY VIRAL CULTURE OR AN FDA-CLEARED INFLUENZA A AND B MOLECULAR ASSAY.          Influenza B Ag NEGATIVE         Comment: NEGATIVE FOR THE PRESENCE OF INFLUENZA B ANTIGEN  INFECTION DUE TO INFLUENZA B CANNOT BE RULED OUT.  BECAUSE THE ANTIGEN PRESENT IN THE SAMPLE MAY BE BELOW  THE DETECTION LIMIT OF THE TEST.  A NEGATIVE TEST IS PRESUMPTIVE AND IT IS RECOMMENDED THAT THESE RESULTS BE CONFIRMED BY VIRAL CULTURE OR AN FDA-CLEARED INFLUENZA A AND B MOLECULAR ASSAY.          Source Nasopharyngeal             Current Meds:  Current Facility-Administered Medications   Medication Dose Route Frequency   ??? 0.9% sodium chloride infusion  100 mL/hr IntraVENous CONTINUOUS   ??? vancomycin (VANCOCIN) 1500 mg in NS 500 ml infusion  1,500 mg IntraVENous Q12H   ??? acetaminophen (TYLENOL) tablet 650 mg  650 mg Oral Q6H PRN   ??? tuberculin injection 5 Units  5 Units IntraDERMal ONCE   ??? famotidine (PEPCID) tablet 20 mg  20 mg Oral BID   ??? LORazepam (ATIVAN) injection 1 mg  1 mg IntraVENous ONCE PRN   ??? promethazine (PHENERGAN) with saline injection 12.5 mg  12.5 mg IntraVENous Q6H PRN   ??? sodium chloride (NS) flush 5-40 mL  5-40 mL IntraVENous Q8H   ??? sodium chloride (NS) flush 5-40 mL  5-40 mL IntraVENous PRN   ??? diphenhydrAMINE (BENADRYL) capsule 25 mg  25 mg Oral Q4H PRN   ??? ondansetron (ZOFRAN) injection 4 mg  4 mg IntraVENous Q4H PRN   ??? senna-docusate (PERICOLACE) 8.6-50 mg per tablet 2 Tab  2 Tab Oral DAILY PRN   ??? heparin (porcine) injection 5,000 Units  5,000 Units SubCUTAneous Q8H   ??? albuterol-ipratropium (DUO-NEB) 2.5 MG-0.5 MG/3 ML  3 mL Nebulization Q6H PRN   ??? piperacillin-tazobactam (ZOSYN) 3.375 g in 0.9% sodium chloride (MBP/ADV) 100 mL  3.375 g IntraVENous Q8H   ??? morphine injection 1 mg  1 mg IntraVENous Q4H PRN   ??? influenza vaccine 2019-20 (6 mos+)(PF) (FLUARIX/FLULAVAL/FLUZONE QUAD) injection 0.5 mL  0.5 mL IntraMUSCular PRIOR TO DISCHARGE       Other Studies:    [place] Abd Comp    Result Date: 03/01/2019   ABDOMINAL ULTRASOUND. HISTORY: Elevated liver enzymes, jaundice and abdominal pain. COMPARISON: None FINDINGS: Pancreas is grossly unremarkable although the tail is partially obscured by overlying bowel gas.. Aorta is normal caliber, 2.6 cm. The IVC is patent. The enlarged spleen  has a homogenous echotexture and measures 23 cm. Left kidney measures 15.1 cm included upper pole cyst calcification. No hydronephrosis. Right kidney is unremarkable without hydronephrosis and measures 13.6 cm. Renal echogenicity is normal, the right kidney is hypoechoic to the liver. The intrahepatic biliary tree is not dilated. There is hepatopetal flow in the portal vein. Small echogenic hemangioma right lobe liver, 9 x 13 mm. The gallbladder wall is thickened, up to 8 mm. Multiple small echogenic shadowing gallstones. The common bile duct is not dilated, 6 mm.     IMPRESSION: Cholelithiasis with gallbladder wall thickening, possibly acute or chronic cholecystitis. No biliary tree obstruction.     Xr Chest Port    Result Date: 06-Mar-2019  1 View portable chest x-ray 2019/03/06 8:32 AM Indication: Chest pain Comparison: 12/08/2010 Findings: This portable upright AP chest of 0809 shows the monitoring leads overlying the chest. Lung volumes are low, only slight crowding though suggested at the bases. Cardiomediastinal silhouette within normal limits. No confluent infiltrate, no significant effusion. Vascularity seems appropriate.     IMPRESSION: Mild pulmonary hypoinflation. Otherwise no acute infiltrate suggested.          Assessment and Plan:     Hospital Problems as of 03/03/2019 Date Reviewed: 06-Mar-2019          Codes Class Noted - Resolved POA    Pancytopenia ([place]) ICD-10-CM: Y60.630  ICD-9-CM: 284.19  03/02/2019 - Present Unknown        Jaundice ICD-10-CM: R17  ICD-9-CM: 782.4  Mar 06, 2019 - Present Unknown        CAD (coronary artery disease) ICD-10-CM: I25.10  ICD-9-CM: 414.00  2019/03/06 - Present Unknown         COPD ICD-9-CM: 496  03/06/19 - Present Unknown        Normocytic anemia ICD-10-CM: D64.9  ICD-9-CM: 285.9  Mar 06, 2019 - Present Unknown        Lactic acidosis ICD-10-CM: E87.2  ICD-9-CM: 276.2  2019/03/06 - Present Unknown        * (Principal) Elevated liver enzymes ICD-10-CM: R74.8  ICD-9-CM: 790.5  03-06-2019 - Present Unknown              Plan:    Mr. 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   ??  Abdominal Pain, Jaundice, Hepatocellular pattern of elevated Liver Enzymes   Cholecystitis due to cholelithiasis  Hap A infection (IgM positive)  - LFTs down trending.   - Normal tylenol level but drug screen for amphetamines.   - ultrasound Abd : Cholelithiasis within the gallbladder with wall thickening.  No biliary tree obstruction.  - Hepatitis panel, HIV pending  - zosyn (06-Mar-2023  - GI consult : MRCP today.  - will defer to GI for Hap A management: vaccine +/- Ig      Lactic acidosis: improving  - Afrebril, no leukocytosis on arrival.  CXR showed no acute infectious process. UA is neg   - BCx: one bottle positive for GPC coag neg likely comtaminant. 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 Along with splenomegaly, elevated LDH likely be causing destruction in the enlarged spleen.  - stool occult neg.  - peripheral smear  ??  MI s/p stents (3-420yrago)  -  not on any meds  ??  COPD  - not on any meds  - nebs prn  - o2 supplement as needed    Headache S/p Fall (prior to admission)  - did not hit his head or LOC per patient  - CT Head w/o contrast : neg for acute abnormalities    Diet:  DIET NUTRITIONAL SUPPLEMENTS  DIET NPO  DVT PPx: heparin sq  Code: Full Code      Medical Decision Making:    Labs/Imaging reviewed.  Additional information obtained from nursing staff.   Patient is high risk due to medical condition and comorbidities.  Plan discussed with nursing staff.  Plan discussed with patient/family.  All questions/concerns were addressed. Pt/family agrees with the plan.         Signed By: Helmut Muster, MD     March 03, 2019

## 2019-03-03 NOTE — Progress Notes (Signed)
[facility] Progress Note         Admit Date:  03/01/2019    Today's Date:  03/03/2019    CC:  Abnormal LFTs    Subjective:     Patient is continuing to have diffuse abd pain and nausea, worse than yesterday, but denies any vomiting this morning.  He has not had any BMs today.  He states he has been having substernal and left sided chest pain since admission, as well as SOB.    Medications:   Current Facility-Administered Medications   Medication Dose Route Frequency   ??? 0.9% sodium chloride infusion  100 mL/hr IntraVENous CONTINUOUS   ??? acetaminophen (TYLENOL) tablet 650 mg  650 mg Oral Q6H PRN   ??? famotidine (PEPCID) tablet 20 mg  20 mg Oral BID   ??? LORazepam (ATIVAN) injection 1 mg  1 mg IntraVENous ONCE PRN   ??? promethazine (PHENERGAN) with saline injection 12.5 mg  12.5 mg IntraVENous Q6H PRN   ??? sodium chloride (NS) flush 5-40 mL  5-40 mL IntraVENous Q8H   ??? sodium chloride (NS) flush 5-40 mL  5-40 mL IntraVENous PRN   ??? diphenhydrAMINE (BENADRYL) capsule 25 mg  25 mg Oral Q4H PRN   ??? ondansetron (ZOFRAN) injection 4 mg  4 mg IntraVENous Q4H PRN   ??? senna-docusate (PERICOLACE) 8.6-50 mg per tablet 2 Tab  2 Tab Oral DAILY PRN   ??? heparin (porcine) injection 5,000 Units  5,000 Units SubCUTAneous Q8H   ??? albuterol-ipratropium (DUO-NEB) 2.5 MG-0.5 MG/3 ML  3 mL Nebulization Q6H PRN   ??? piperacillin-tazobactam (ZOSYN) 3.375 g in 0.9% sodium chloride (MBP/ADV) 100 mL  3.375 g IntraVENous Q8H   ??? morphine injection 1 mg  1 mg IntraVENous Q4H PRN   ??? influenza vaccine 2019-20 (6 mos+)(PF) (FLUARIX/FLULAVAL/FLUZONE QUAD) injection 0.5 mL  0.5 mL IntraMUSCular PRIOR TO DISCHARGE       Review of Systems:  ROS was obtained, with pertinent positives as listed above.    Diet:  NPO    Objective:   Vitals:  Visit Vitals  BP 111/70   Pulse 75   Temp 98 ??F (36.7 ??C)   Resp 18   Ht 5\' 10"  (1.778 m)   Wt 89.9 kg (198 lb 4.8 oz)   SpO2 92%   BMI 28.45 kg/m??     Intake/Output:  No intake/output data recorded.   03/07 1901 - 03/09 0700  In: 2549.2 [P.O.:240; I.V.:2309.2]  Out: 590 [Urine:590]  Exam:  General appearance: alert, cooperative, no distress, lying in bed, jaundiced, flat affect  Lungs: clear to auscultation bilaterally anteriorly  Heart: regular rate and rhythm  Abdomen: soft, mildly tender over abd. Bowel sounds normal. No masses, no organomegaly  Neuro:  alert and oriented    Data Review (Labs):    Recent Labs     03/03/19  0526 03/02/19  0601 03/01/19  1500 03/01/19  0811   WBC 3.4* 2.9*  --  4.3   HGB 9.9* 9.8*  --  10.6*   HCT 31.0* 29.5*  --  32.1*   PLT 132* 122*  --  148*   MCV 94.5 90.8  --  91.7   NA 142 139  --  134*   K 3.8 3.7  --  3.4*   CL 112* 107  --  103   CO2 22 23  --  24   BUN 12 11  --  20   CREA 0.81 0.94  --  0.94  CA 7.6* 7.5*  --  7.7*   GLU 90 104*  --  111*   AP 187* 170*  --  178*   SGOT 491* 598*  --  928*   ALT 572* 683*  --  964*   TBILI 4.4* 4.2*  --  4.9*   CBIL 3.8* 3.7* 4.0*  --    ALB 1.8* 1.7*  --  2.1*   TP 5.9* 5.7*  --  6.4   PTP 15.8*  --   --  18.0*   INR 1.2  --   --  1.4   APTT 41.6*  --   --   --       Ref. Range 03/01/2019 08:11 03/01/2019 10:31 03/01/2019 15:00 03/01/2019 21:23 03/02/2019 06:01 03/02/2019 08:08 03/02/2019 15:15   LD Latest Ref Range: 100 - 190 U/L   215 (H)       Lactic acid Latest Ref Range: 0.4 - 2.0 MMOL/L  4.4 (HH) 2.6 (HH)   2.2 (HH) 2.5 (HH)   Procalcitonin Latest Units: ng/mL 3.44         Troponin-I, Qt. Latest Ref Range: 0.02 - 0.05 NG/ML <0.02 (L)         Iron Latest Ref Range: 35 - 150 ug/dL   68       TIBC Latest Ref Range: 250 - 450 ug/dL   161 (L)       Transferrin Saturation Latest Ref Range: >20 %   40       Ferritin Latest Ref Range: 8 - 388 NG/ML   411 (H)       Ammonia Latest Ref Range: 11 - 32 UMOL/L    22         Ref. Range 03/01/2019 15:00   Acetaminophen level Latest Ref Range: 10.0 - 30.0 ug/mL <10 (L)      Ref. Range 03/01/2019 12:49   AMPHETAMINES Latest Units:   POSITIVE   BARBITURATES Latest Units:   NEGATIVE    BENZODIAZEPINES Latest Units:   NEGATIVE   COCAINE Latest Units:   NEGATIVE   METHADONE Latest Units:   NEGATIVE   OPIATES Latest Units:   NEGATIVE   PCP(PHENCYCLIDINE) Latest Units:   NEGATIVE   THC (TH-CANNABINOL) Latest Units:   NEGATIVE      Ref. Range 03/01/2019 17:51   Occult blood, stool Latest Ref Range: NEG   NEGATIVE     HEPATITIS PANEL, ACUTE [[id number]] (Order [id number]) Lab   Date: 03/01/2019 Department: [facility] Released By: Saralyn Pilar, RN (auto-released) Authorizing: Hal Morales, MD   Component Value Flag Ref Range Units Status   Hepatitis A Ab, IgM Positive  Abnormal   NEGATIVE ?? Final   Hep B surface Ag screen NEGATIVE    NEGATIVE ?? Final   Hep B Core Ab, IgM NEGATIVE    NEGATIVE ?? Final   Hep C Virus Ab 0.1   0.0 - 0.9 s/co ratio Final   Comment:   (NOTE)   ?? ?? ?? ?? ?? ?? ?? ?? ?? ?? ?? ?? ?? ?? ?? ?? ??Negative: ?? ?? < 0.8   ?? ?? ?? ?? ?? ?? ?? ?? ?? ?? ?? ?? ?? ?? Indeterminate: 0.8 - 0.9   ?? ?? ?? ?? ?? ?? ?? ?? ?? ?? ?? ?? ?? ?? ?? ?? ??Positive: ?? ?? > 0.9      BLOOD CULTURE ID PANEL [[id number]] (Order [id number]) Microbiology   Date: 03/03/2019 Department: [facility]  Med Surg Released By: ??(auto-released) Authorizing: Virgina Evener, MD   Specimen Information: Blood   ??    Component Value Flag Ref Range Units Status   Acc. no. from Micro Order [id number]     Final   Staphylococcus DETECTED  Abnormal   NOTDET ?? Final   Comment:   RESULTS VERIFIED, PHONED TO AND READ BACK BY   Laurence Aly @ [phone] ON 03/03/2019 AK.    mecA (Methicillin-Resistance Genes) NOT DETECTED   NOTDET ?? Final   INTERPRETATION    ?? Final   Gram positive cocci in clusters. Identified by realtime PCR as ??Coagulase negative Staphylococci    Comment:   A single positive culture of coagulase negative Staph is likely to be a contaminant in adult patients. Consider discontinuation of antibiotics for gram positive bloodstream infections if patient asymptomatic. ??THIS TEST DOES NOT REPLACE SENSITIVITY TESTING.     ABDOMINAL ULTRASOUND. 03/01/2019??   HISTORY: Elevated liver enzymes, jaundice and abdominal pain.??  COMPARISON: None??  FINDINGS: Pancreas is grossly unremarkable although the tail is partially  obscured by overlying bowel gas.. Aorta is normal caliber, 2.6 cm. The IVC is  patent. The enlarged spleen has a homogenous echotexture and measures 23 cm.   Left kidney measures 15.1 cm included upper pole cyst calcification. No  hydronephrosis. Right kidney is unremarkable without hydronephrosis and measures  13.6 cm. Renal echogenicity is normal, the right kidney is hypoechoic to the  liver. The intrahepatic biliary tree is not dilated. There is hepatopetal flow  in the portal vein. Small echogenic hemangioma right lobe liver, 9 x 13 mm. The  gallbladder wall is thickened, up to 8 mm. Multiple small echogenic shadowing  gallstones. The common bile duct is not dilated, 6 mm. ??  IMPRESSION  IMPRESSION: Cholelithiasis with gallbladder wall thickening, possibly acute or  chronic cholecystitis. No biliary tree obstruction.    Ceruloplasmin, haptoglobin pending 01 March 2019    Assessment:     Principal Problem:    Elevated liver enzymes (03/01/2019)    Active Problems:    Jaundice (03/01/2019)      CAD (coronary artery disease) (03/01/2019)      COPD (03/01/2019)      Normocytic anemia (03/01/2019)      Lactic acidosis (03/01/2019)      Pancytopenia (HCC) (03/02/2019)    58 yo male, now pt of Dr. Patty Sermons, with PMH of CAD on Plavix, COPD, HTN, Seizures, GERD, ulcers, who was seen in consult 02 March 2019 for abnormal LFTs.  He presented with RUQ abd pain, nausea, and was noted to have abnormal labs in the ER with T bili 4.2, direct bili 3.7, ALT 683, AST 598, AP 170, lactic acid 2.2, WBC 2.9, Hgb 9.8, Hct 29.5, plt 122, INR 1.4, BUN 11, Creat 0.94.  [place] noted cholelithiasis with gallbladder wall thickening, possibly acute or chronic cholecystitis, but no biliary tree obstruction.  LFTs are trending down.  Continues to have abd pain and  nausea.  Hep A Ab IgM was positive, which may be the cause of his symptoms.  He also described chest pain and SOB.  Hgb is stable, with no iron def noted and hemoccult was negative.    Plan:     -Supportive care.  -MRCP pending.  -Ceruloplasmin, HIV pending.  -Follow labs.  -I discussed chest pain and SOB with his RN.  She stated he had not mentioned it to the RN staff and she will discuss with the hospitalist.  -Discuss colonoscopy and EGD  as outpatient, once acute issues resolve.  -Follow.    Patient is seen and examined in collaboration with Dr. Shari Prows.  Assessment and plan as per Dr. Salley Hews.    Wilfrido Luedke S. Marin Shutter  [facility]

## 2019-03-03 NOTE — Progress Notes (Signed)
1/2 BC from admission showing gram positive cocci. Biofire pending to be resulted. Will start IV VANCO and will repeat a second set of BC. Hopefully biofire id will be back soon. If coag neg staph is reported this is most likely a contaminant.

## 2019-03-03 NOTE — Progress Notes (Signed)
Care Management Interventions  PCP Verified by CM: Yes(does not currently have one needs one through the [place])  Mode of Transport at Discharge: Self  Transition of Care Consult (CM Consult): Discharge Planning  Discharge Durable Medical Equipment: No  Physical Therapy Consult: No  Occupational Therapy Consult: No  Speech Therapy Consult: No  Current Support Network: Other(Homeless)  Confirm Follow Up Transport: Self  Discharge Location  Discharge Placement: Homeless    CM met with patient in room.  Patient alert and orient and verified demographic information.  Patient stated he has been homeless for a short time as he left his wife and has [place] living [place] the woods.  Patient stated he has no family local.  Patient stated he has a sister who lives in [place] and one who lives in [place]. Patient stated he has no vehicle, no phone, is unable to find his wife.  Patient stated he wants to go to a halfway house but stated he is unable to work as he cannot stand on feet long. Patient stated he applied for disability in the past but was denied.  CM will reach out to [facility] to have them assist with applying for disability.  Patient has insurance through the [place].  CM will attempt to get patient a physician through the [place] as he has not had one since he was in [place]. CM will provide patient a list of halfway houses.  Patient will have to call the homes himself as they require an interview prior to being accepted to their facility. CM explained to patient may halfway houses require a patient to work and assist with housework to remain living there.  Patient stated he would be able to do that kind of work.    CM will continue to follow patient during hospitalization for discharge planning and needs.  Please consult CM for new needs.

## 2019-03-03 NOTE — Progress Notes (Signed)
Patient unable to tolerate Abdominal MRI due to claustrophobia despite being medicated. RN Johana notified.

## 2019-03-03 NOTE — Progress Notes (Signed)
Received call from GI PA, Sabrina, that patient said he has had chest pain since admission. Patient has not mentioned this today nor to night shift RN who gave shift report.  Patient has only complained of abdominal pain today. Dr. Ezekiel Ina made aware.

## 2019-03-03 NOTE — Progress Notes (Signed)
 Pharmacokinetic Consult to Pharmacist    Vincent Smith is a 58 y.o. male being treated for bloodstream infection with vancomycin.    Height: 5' 10 (177.8 cm)  Weight: 86.2 kg (190 lb)  Lab Results   Component Value Date/Time    BUN 11 03/02/2019 06:01 AM    Creatinine 0.94 03/02/2019 06:01 AM    WBC 2.9 (L) 03/02/2019 06:01 AM    Procalcitonin 3.44 03/01/2019 08:11 AM    Lactic acid 2.5 (HH) 03/02/2019 03:15 PM      Estimated Creatinine Clearance: 88.4 mL/min (based on SCr of 0.94 mg/dL).    CULTURES:  Results     Procedure Component Value Units Date/Time    CULTURE, BLOOD [[id number]]     Order Status:  Sent Specimen:  Blood     CULTURE, BLOOD [[id number]]     Order Status:  Sent Specimen:  Blood     CULTURE, BLOOD [[id number]]     Order Status:  Canceled Specimen:  Blood     CULTURE, BLOOD [[id number]]     Order Status:  Canceled Specimen:  Blood     CULTURE, BLOOD [[id number]] Collected:  03/01/19 1335    Order Status:  Completed Specimen:  Blood Updated:  03/02/19 1347     Special Requests: --        RIGHT  Antecubital       Culture result: NO GROWTH AFTER 23 HOURS       CULTURE, BLOOD [[id number]] Collected:  03/01/19 1335    Order Status:  Completed Specimen:  Blood Updated:  03/03/19 0206     Special Requests: --        RIGHT  FOREARM       GRAM STAIN GRAM POSITIVE COCCI         ANAEROBIC BOTTLE POSITIVE               RESULTS VERIFIED, PHONED TO AND READ BACK BY T.LITTLETON,RN @ 0157 03/03/19 MM           Culture result:       CULTURE IN PROGRESS,FURTHER UPDATES TO FOLLOW            REFER TO BIOFIRE [id number]    BLOOD CULTURE ID PANEL [[id number]] Collected:  03/01/19 1335    Order Status:  Completed Updated:  03/03/19 0106    INFLUENZA A & B AG (RAPID TEST) [[id number]] Collected:  03/01/19 0918    Order Status:  Completed Specimen:  Nasopharyngeal from Nasal washing Updated:  03/01/19 0939     Influenza A Ag NEGATIVE         Comment: NEGATIVE FOR THE PRESENCE OF INFLUENZA A ANTIGEN  INFECTION DUE TO INFLUENZA A  CANNOT BE RULED OUT.  BECAUSE THE ANTIGEN PRESENT IN THE SAMPLE MAY BE BELOW  THE DETECTION LIMIT OF THE TEST.  A NEGATIVE TEST IS PRESUMPTIVE AND IT IS RECOMMENDED THAT THESE RESULTS BE CONFIRMED BY VIRAL CULTURE OR AN FDA-CLEARED INFLUENZA A AND B MOLECULAR ASSAY.          Influenza B Ag NEGATIVE         Comment: NEGATIVE FOR THE PRESENCE OF INFLUENZA B ANTIGEN  INFECTION DUE TO INFLUENZA B CANNOT BE RULED OUT.  BECAUSE THE ANTIGEN PRESENT IN THE SAMPLE MAY BE BELOW  THE DETECTION LIMIT OF THE TEST.  A NEGATIVE TEST IS PRESUMPTIVE AND IT IS RECOMMENDED THAT THESE RESULTS BE CONFIRMED BY VIRAL CULTURE OR AN FDA-CLEARED INFLUENZA A AND B MOLECULAR ASSAY.  Source Nasopharyngeal               Day 1 of vancomycin.  Goal trough is 15-20.  Vancomycin dose initiated at 2,500 mg load, followed by 1,500 mg q12h.   Will continue to follow patient.      Thank you,  Rolan Daring, PharmD

## 2019-03-03 NOTE — Progress Notes (Signed)
CM spoke with Misty Stanley at the [place] to attempt to get patient with a [facility] physician. Misty Stanley stated the patient would have to call to schedule the appointment.  CM explained patient is currently homeless and does not have access to a telephone outside of the hospital.  Misty Stanley stated she would have someone reach out to patient to get an appointment set up.  Misty Stanley also provided CM with the telephone number and 2 extensions for Social Workers within the [place] for the homeless assistance program.  Misty Stanley stated [facility] has a 29 bed facility for veterans and offer transportation, section 8 vouchers, food, etc.  Misty Stanley explained to CM PPD would need to be placed or a chest x-ray performed.  Misty Stanley also explained patient would need to call as they have to perform a phone screening. CM provided patient with the number for the homeless assistance program [phone] ext [phone] or ext. [phone] [address] [place].  CM explained to patient the number is long distance so he will need to call for his nurse to make the call as there is a code needed. Patient verbalized understanding.

## 2019-03-03 NOTE — Progress Notes (Signed)
1/2 BC from admission showing gram positive cocci. Biofire pending to be resulted. Will start IV VANCO and will repeat a second set of BC. Hopefully biofire id will be back soon. If coag neg staph is reported this is most likely a contaminant.

## 2019-03-03 NOTE — Progress Notes (Signed)
Patient unable to tolerate Abdominal MRI due to claustrophobia despite being medicated. RN Leonie Douglas notified.

## 2019-03-03 NOTE — Progress Notes (Signed)
Hourly rounds completed this shift. Patient complained of pain, nausea, & vomiting. Patient medicated per MAR. All needs met at this time. Will continue to monitor & give report to oncoming RN.

## 2019-03-03 NOTE — Progress Notes (Signed)
Care Management Interventions  PCP Verified by CM: Yes(does not currently have one needs one through the [facility])  Mode of Transport at Discharge: Self  Transition of Care Consult (CM Consult): Discharge Planning  Discharge Durable Medical Equipment: No  Physical Therapy Consult: No  Occupational Therapy Consult: No  Speech Therapy Consult: No  Current Support Network: Other(Homeless)  Confirm Follow Up Transport: Self  Discharge Location  Discharge Placement: Homeless    CM met with patient in room.  Patient alert and orient and verified demographic information.  Patient stated he has been homeless for a short time as he left his wife and has been living in the woods.  Patient stated he has no family local.  Patient stated he has a sister who lives in [place] and one who lives in [place]. Patient stated he has no vehicle, no phone, is unable to find his wife.  Patient stated he wants to go to a halfway house but stated he is unable to work as he cannot stand on feet long. Patient stated he applied for disability in the past but was denied.  CM will reach out to [employer] to have them assist with applying for disability.  Patient has insurance through the [facility].  CM will attempt to get patient a physician through the [facility] as he has not had one since he was in [place]. CM will provide patient a list of halfway houses.  Patient will have to call the homes himself as they require an interview prior to being accepted to their facility. CM explained to patient may halfway houses require a patient to work and assist with housework to remain living there.  Patient stated he would be able to do that kind of work.    CM will continue to follow patient during hospitalization for discharge planning and needs.  Please consult CM for new needs.

## 2019-03-03 NOTE — Progress Notes (Signed)
 Patient refused ativan prior to MRI saying it does not work.  Dr. Verl aware and ordered 1x dose of valium 5 mg.  MRI aware and will call when they can rescheduled procedure for today.

## 2019-03-03 NOTE — Progress Notes (Signed)
Progress Notes by Marijo File, PA at 03/03/19 0840                Author: Marijo File, PA  Service: Physician Assistant  Author Type: Physician Assistant       Filed: 03/03/19 0958  Date of Service: 03/03/19 0840  Status: Attested           Editor: Marijo File, PA (Physician Assistant)  Cosigner: Shon Millet, MD at 03/03/19 1336          Attestation signed by Shon Millet, MD at 03/03/19 1336          Agree with note.  Down in MRI at present.  58 y/o m with RUQ pain, elevated LFT's, normal bile duct on [place], and gallbladder wall thickening with cholelithiasis.   If MRCP positive will need ERCP before cholecystectomy, and if negative then just cholecystectomy.   London Sheer, MD                                       [facility] Progress Note               Admit Date:  03/01/2019      Today's Date:  03/03/2019      CC:  Abnormal LFTs        Subjective:        Patient is continuing to have diffuse abd pain and nausea, worse than yesterday, but denies any vomiting this morning.  He has not had any BMs today.  He states he has been having substernal and left sided chest pain since admission, as well as SOB.      Medications:      Current Facility-Administered Medications          Medication  Dose  Route  Frequency           ?  0.9% sodium chloride infusion   100 mL/hr  IntraVENous  CONTINUOUS     ?  acetaminophen (TYLENOL) tablet 650 mg   650 mg  Oral  Q6H PRN     ?  famotidine (PEPCID) tablet 20 mg   20 mg  Oral  BID     ?  LORazepam (ATIVAN) injection 1 mg   1 mg  IntraVENous  ONCE PRN     ?  promethazine (PHENERGAN) with saline injection 12.5 mg   12.5 mg  IntraVENous  Q6H PRN     ?  sodium chloride (NS) flush 5-40 mL   5-40 mL  IntraVENous  Q8H     ?  sodium chloride (NS) flush 5-40 mL   5-40 mL  IntraVENous  PRN     ?  diphenhydrAMINE (BENADRYL) capsule 25 mg   25 mg  Oral  Q4H PRN           ?  ondansetron (ZOFRAN) injection 4 mg   4 mg  IntraVENous  Q4H PRN            ?  senna-docusate (PERICOLACE) 8.6-50 mg per tablet 2 Tab   2 Tab  Oral  DAILY PRN     ?  heparin (porcine) injection 5,000 Units   5,000 Units  SubCUTAneous  Q8H     ?  albuterol-ipratropium (DUO-NEB) 2.5 MG-0.5 MG/3 ML   3 mL  Nebulization  Q6H PRN     ?  piperacillin-tazobactam (ZOSYN)  3.375 g in 0.9% sodium chloride (MBP/ADV) 100 mL   3.375 g  IntraVENous  Q8H     ?  morphine injection 1 mg   1 mg  IntraVENous  Q4H PRN           ?  influenza vaccine 2019-20 (6 mos+)(PF) (FLUARIX/FLULAVAL/FLUZONE QUAD) injection 0.5 mL   0.5 mL  IntraMUSCular  PRIOR TO DISCHARGE           Review of Systems:   ROS was obtained, with pertinent positives as listed above.      Diet:  NPO        Objective:     Vitals:   Visit Vitals      BP  111/70     Pulse  75     Temp  98 ??F (36.7 ??C)     Resp  18     Ht  5\' 10"  (1.778 m)     Wt  89.9 kg (198 lb 4.8 oz)     SpO2  92%        BMI  28.45 kg/m??        Intake/Output:   No intake/output data recorded.   03/07 1901 - 03/09 0700   In: 2549.2 [P.O.:240; I.V.:2309.2]   Out: 590 [Urine:590]   Exam:   General appearance: alert, cooperative, no distress, lying in bed, jaundiced, flat affect   Lungs: clear to auscultation bilaterally anteriorly   Heart: regular rate and rhythm   Abdomen: soft, mildly tender over abd. Bowel sounds normal. No masses, no organomegaly   Neuro:  alert and oriented      Data Review (Labs):       Recent Labs              03/03/19   0526  03/02/19   0601  03/01/19   1500  03/01/19   0811     WBC  3.4*  2.9*   --   4.3     HGB  9.9*  9.8*   --   10.6*     HCT  31.0*  29.5*   --   32.1*     PLT  132*  122*   --   148*     MCV  94.5  90.8   --   91.7     NA  142  139   --   134*     K  3.8  3.7   --   3.4*     CL  112*  107   --   103     CO2  22  23   --   24     BUN  12  11   --   20     CREA  0.81  0.94   --   0.94     CA  7.6*  7.5*   --   7.7*     GLU  90  104*   --   111*     AP  187*  170*   --   178*     SGOT  491*  598*   --   928*     ALT  572*  683*   --   964*      TBILI  4.4*  4.2*   --   4.9*     CBIL  3.8*  3.7*  4.0*   --      ALB  1.8*  1.7*   --   2.1*     TP  5.9*  5.7*   --   6.4     PTP  15.8*   --    --   18.0*     INR  1.2   --    --   1.4           APTT  41.6*   --    --    --                     Ref. Range  03/01/2019 08:11  03/01/2019 10:31  03/01/2019 15:00  03/01/2019 21:23  03/02/2019 06:01  03/02/2019 08:08  03/02/2019 15:15     LD  Latest Ref Range: 100 - 190 U/L      215 (H)             Lactic acid  Latest Ref Range: 0.4 - 2.0 MMOL/L    4.4 (HH)  2.6 (HH)      2.2 (HH)  2.5 (HH)     Procalcitonin  Latest Units: ng/mL  3.44                 Troponin-I, Qt.  Latest Ref Range: 0.02 - 0.05 NG/ML  <0.02 (L)                 Iron  Latest Ref Range: 35 - 150 ug/dL      68             TIBC  Latest Ref Range: 250 - 450 ug/dL      962 (L)             Transferrin Saturation  Latest Ref Range: >20 %      40             Ferritin  Latest Ref Range: 8 - 388 NG/ML      411 (H)             Ammonia  Latest Ref Range: 11 - 32 UMOL/L        22                    Ref. Range  03/01/2019 15:00     Acetaminophen level  Latest Ref Range: 10.0 - 30.0 ug/mL  <10 (L)              Ref. Range  03/01/2019 12:49     AMPHETAMINES  Latest Units:    POSITIVE     BARBITURATES  Latest Units:    NEGATIVE     BENZODIAZEPINES  Latest Units:    NEGATIVE     COCAINE  Latest Units:    NEGATIVE     METHADONE  Latest Units:    NEGATIVE     OPIATES  Latest Units:    NEGATIVE     PCP(PHENCYCLIDINE)  Latest Units:    NEGATIVE     THC (TH-CANNABINOL)  Latest Units:    NEGATIVE              Ref. Range  03/01/2019 17:51     Occult blood, stool  Latest Ref Range: NEG    NEGATIVE        HEPATITIS PANEL, ACUTE [[id number]] (Order [id number]) Lab         Date: 03/01/2019  Department: [facility]  Released By: Saralyn Pilar, RN (auto-released)  Authorizing: Hal Morales, MD  Component  Value  Flag  Ref Range  Units  Status     Hepatitis A Ab, IgM  Positive   Abnormal    NEGATIVE  ??  Final     Hep B surface Ag screen   NEGATIVE      NEGATIVE  ??  Final     Hep B Core Ab, IgM  NEGATIVE      NEGATIVE  ??  Final     Hep C Virus Ab  0.1     0.0 - 0.9  s/co ratio  Final       Comment:     (NOTE)   ?? ?? ?? ?? ?? ?? ?? ?? ?? ?? ?? ?? ?? ?? ?? ?? ??Negative: ?? ?? < 0.8   ?? ?? ?? ??  ?? ?? ?? ?? ?? ?? ?? ?? ?? ?? Indeterminate: 0.8 - 0.9   ?? ?? ?? ?? ?? ?? ?? ?? ?? ?? ?? ?? ?? ?? ?? ?? ??Positive: ?? ?? > 0.9         BLOOD CULTURE ID PANEL [[id number]] (Order [id number]) Microbiology         Date: 03/03/2019  Department: [facility]  Released By: ??(auto-released)  Authorizing: Virgina Evener, MD        Specimen Information:  Blood     ??              Component  Value  Flag  Ref Range  Units  Status     Acc. no. from Micro Order  [id number]         Final     Staphylococcus  DETECTED   Abnormal    NOTDET  ??  Final       Comment:       RESULTS VERIFIED, PHONED TO AND READ BACK BY   Laurence Aly @ [phone] ON 03/03/2019 AK.             mecA (Methicillin-Resistance Genes)  NOT DETECTED     NOTDET  ??  Final     INTERPRETATION        ??  Final       Gram positive cocci in clusters. Identified by realtime PCR as ??Coagulase negative Staphylococci      Comment:     A single positive culture of coagulase negative Staph is likely to be a contaminant in adult patients. Consider discontinuation of antibiotics for gram  positive bloodstream infections if patient asymptomatic. ??THIS TEST DOES NOT REPLACE SENSITIVITY TESTING.        ABDOMINAL ULTRASOUND. 03/01/2019??   HISTORY: Elevated liver enzymes, jaundice and abdominal pain.??   COMPARISON: None??   FINDINGS: Pancreas is grossly unremarkable although the tail is partially   obscured by overlying bowel gas.. Aorta is normal caliber, 2.6 cm. The IVC is   patent. The enlarged spleen has a homogenous echotexture and measures 23 cm.    Left kidney measures 15.1 cm included upper pole cyst calcification. No   hydronephrosis. Right kidney is unremarkable without hydronephrosis and measures   13.6 cm. Renal echogenicity is normal, the right kidney is hypoechoic to the   liver.  The intrahepatic biliary tree is not dilated. There is hepatopetal flow   in the portal vein. Small echogenic hemangioma right lobe liver, 9 x 13 mm. The   gallbladder wall is thickened, up to 8 mm. Multiple small echogenic shadowing   gallstones. The common bile duct is not dilated, 6 mm. ??  IMPRESSION   IMPRESSION: Cholelithiasis with gallbladder wall thickening, possibly acute or   chronic cholecystitis. No biliary tree obstruction.      Ceruloplasmin, haptoglobin pending 01 March 2019        Assessment:        Principal Problem:     Elevated liver enzymes (03/01/2019)      Active Problems:     Jaundice (03/01/2019)        CAD (coronary artery disease) (03/01/2019)        COPD (03/01/2019)        Normocytic anemia (03/01/2019)        Lactic acidosis (03/01/2019)        Pancytopenia (HCC) (03/02/2019)      58 yo male, now pt of Dr. Patty SermonsBrackbill, with PMH of CAD on Plavix, COPD, HTN, Seizures, GERD, ulcers, who was seen in consult 02 March 2019 for abnormal LFTs.  He presented with  RUQ abd pain, nausea, and was noted to have abnormal labs in the ER with T bili 4.2, direct bili 3.7, ALT 683, AST 598, AP 170, lactic acid 2.2, WBC 2.9, Hgb 9.8, Hct 29.5, plt 122, INR 1.4, BUN 11, Creat 0.94.  US noted cholelithiasis with gallbladder  wall thickening, possibly acute or chronic cholecystitis, but no biliary tree obstruction.  LFTs are trending down.  Continues to have abd pain and nausea.  Hep A Ab IgM was positive, which may be the cause of his symptoms.  He also described chest pain  and SOB.  Hgb is stable, with no iron def noted and hemoccult was negative.        Plan:        -Supportive care.   -MRCP pending.   -Ceruloplasmin, HIV pending.   -Follow labs.   -I discussed chest pain and SOB with his RN.  She stated he had not mentioned it to the RN staff and she will discuss with the hospitalist.   -Discuss colonoscopy and EGD as outpatient, once acute issues resolve.   -Follow.      Patient is seen and examined in collaboration with  Dr. Shari Prowshristopher Fyock.  Assessment and plan as per Dr. Salley HewsFyock.      Latana Colin S. Marin Shutteraja, PA-C   [facility]

## 2019-03-03 NOTE — Progress Notes (Signed)
[facility]    Addendum to note from earlier today:  PE  Abd: possible umbilical hernia noted, with fullness and tenderness over the umbilicus.    Lex Linhares S. Marin Shutter  [facility]

## 2019-03-03 NOTE — Progress Notes (Signed)
Progress  Notes by Helmut Muster, MD at 03/03/19 [phone]                Author: Helmut Muster, MD  Service: Internal Medicine  Author Type: Physician       Filed: 03/03/19 0933  Date of Service: 03/03/19 0731  Status: Signed          Editor: Helmut Muster, MD (Physician)                         Hospitalist Note        Admit Date:  03/01/2019  8:04 AM    Name:  Vincent Smith    Age:  58 y.o.   DOB:  1961/11/05    MRN:  [id number]    PCP:  Lacey Jensen, MD   Treatment Team: Attending Provider: Helmut Muster, MD; Consulting Provider: Flint Melter, MD; Utilization Review: Loyce Dys, RN; Primary Nurse: Madie Reno, Beverley Fiedler,  RN        HPI/Subjective:     Patient is seen and examined at bedside.     Overnight, patient was found in the bathroom but alert and oriented. He did not hit his head.    He was started on vanc IV as 1/2 BCx positive for GPC.  Biofire panel showed coag neg staph likely contaminant.      Reports being claustrophobic and needing meds for MRI. States he needed valium last time to complete MRI. Patient is AAOx3 but lethargic which has not changed from prior days.    He is tolerating PO and having BM but also having episodes of nausea despite zofran and phenegran (helps better).      MRCP pending today.        Objective:        Patient Vitals for the past 24 hrs:            Temp  Pulse  Resp  BP  SpO2            03/03/19 0505  97.6 ??F (36.4 ??C)  75  19  144/51  99 %            03/03/19 0155  --  --  --  123/64  --     03/03/19 0040  --  --  --  103/64  --     03/03/19 0030  98.4 ??F (36.9 ??C)  70  18  (!) 83/42  98 %     03/02/19 2211  98.2 ??F (36.8 ??C)  74  18  94/54  98 %     03/02/19 1956  98.3 ??F (36.8 ??C)  79  18  110/60  100 %     03/02/19 1555  98.2 ??F (36.8 ??C)  72  17  90/59  99 %     03/02/19 1235  98.3 ??F (36.8 ??C)  66  18  99/52  99 %            03/02/19 0825  97.7 ??F (36.5 ??C)  74  17  107/70  97 %        Oxygen Therapy   O2 Sat (%): 99 % (03/03/19 0505)   Pulse via Oximetry:  67 beats per minute (03/01/19 1300)   O2 Device: Room air (03/01/19 0811)      Estimated body mass index is 28.45 kg/m?? as calculated from the following:     Height as of 03/02/19: '5\' 10"'  (  1.778 m).     Weight as of this encounter: 89.9 kg (198 lb 4.8 oz).         Intake/Output Summary (Last 24 hours) at 03/03/2019 0731   Last data filed at 03/03/2019 0610     Gross per 24 hour        Intake  2549.16 ml        Output  590 ml        Net  1959.16 ml          *Note that automatically entered I/Os may not be accurate; dependent on patient compliance with collection and accurate data entry by techs.      Physical Exam:       General:                   alert, awake, jaundice, ill appearing    Head:                        normocephalic, atraumatic   Eyes, Ears, nose:    PERRL, EOMI. Sclera icterus   Neck:                         supple, non-tender    Lungs:                       Crackles on the bases without any wheezing.   Cardiac:                     RRR, Normal S1 and S2.     Abdomen:                  Soft, distended, TTP diffusely without guarding   Extremities:               Warm, dry. No edema    Skin:                           No rashes, no jaundice   Neuro:              Alert and oriented to person, time, place, situation. No gross focal neurological deficit   Psychiatric:                No anxiety, calm, cooperative            Data Review:   I have reviewed all labs, meds, and studies from the last 24 hours:        Recent Results (from the past 24 hour(s))     LACTIC ACID          Collection Time: 03/02/19  8:08 AM         Result  Value  Ref Range            Lactic acid  2.2 (HH)  0.4 - 2.0 MMOL/L       LACTIC ACID          Collection Time: 03/02/19  3:15 PM         Result  Value  Ref Range            Lactic acid  2.5 (HH)  0.4 - 2.0 MMOL/L       METABOLIC PANEL, BASIC          Collection Time: 03/03/19  5:26 AM  Result  Value  Ref Range            Sodium  142  136 - 145 mmol/L       Potassium  3.8  3.5 - 5.1  mmol/L            Chloride  112 (H)  98 - 107 mmol/L            CO2  22  21 - 32 mmol/L       Anion gap  8  7 - 16 mmol/L       Glucose  90  65 - 100 mg/dL       BUN  12  6 - 23 MG/DL       Creatinine  0.81  0.8 - 1.5 MG/DL       GFR est AA  >60  >60 ml/min/1.39m       GFR est non-AA  >60  >60 ml/min/1.759m      Calcium  7.6 (L)  8.3 - 10.4 MG/DL       CBC WITH AUTOMATED DIFF          Collection Time: 03/03/19  5:26 AM         Result  Value  Ref Range            WBC  3.4 (L)  4.3 - 11.1 K/uL       RBC  3.28 (L)  4.23 - 5.6 M/uL       HGB  9.9 (L)  13.6 - 17.2 g/dL       HCT  31.0 (L)  41.1 - 50.3 %       MCV  94.5  79.6 - 97.8 FL       MCH  30.2  26.1 - 32.9 PG       MCHC  31.9  31.4 - 35.0 g/dL       RDW  15.3 (H)  11.9 - 14.6 %       PLATELET  132 (L)  150 - 450 K/uL       MPV  12.2  9.4 - 12.3 FL       ABSOLUTE NRBC  0.00  0.0 - 0.2 K/uL       NEUTROPHILS  48  43 - 78 %       LYMPHOCYTES  39  13 - 44 %       MONOCYTES  10  4.0 - 12.0 %       EOSINOPHILS  2  0.5 - 7.8 %       BASOPHILS  0  0.0 - 2.0 %       IMMATURE GRANULOCYTES  1  0.0 - 5.0 %       ABS. NEUTROPHILS  1.7  1.7 - 8.2 K/UL       ABS. LYMPHOCYTES  1.3  0.5 - 4.6 K/UL       ABS. MONOCYTES  0.3  0.1 - 1.3 K/UL            ABS. EOSINOPHILS  0.1  0.0 - 0.8 K/UL            ABS. BASOPHILS  0.0  0.0 - 0.2 K/UL       ABS. IMM. GRANS.  0.0  0.0 - 0.5 K/UL       RBC COMMENTS  NORMOCYTIC/NORMOCHROMIC          WBC COMMENTS  OCCASIONAL  PLATELET COMMENTS  SLIGHT          DF  AUTOMATED          PTT          Collection Time: 03/03/19  5:26 AM         Result  Value  Ref Range            aPTT  41.6 (H)  24.3 - 35.4 SEC       PROTHROMBIN TIME + INR          Collection Time: 03/03/19  5:26 AM         Result  Value  Ref Range            Prothrombin time  15.8 (H)  12.0 - 14.7 sec       INR  1.2          HEPATIC FUNCTION PANEL          Collection Time: 03/03/19  5:26 AM         Result  Value  Ref Range            Protein, total  5.9 (L)  6.3 - 8.2 g/dL        Albumin  1.8 (L)  3.5 - 5.0 g/dL       Globulin  4.1 (H)  2.3 - 3.5 g/dL       A-G Ratio  0.4 (L)  1.2 - 3.5         Bilirubin, total  4.4 (H)  0.2 - 1.1 MG/DL       Bilirubin, direct  3.8 (H)  <0.4 MG/DL       Alk. phosphatase  187 (H)  50 - 136 U/L       AST (SGOT)  491 (H)  15 - 37 U/L            ALT (SGPT)  572 (H)  12 - 65 U/L              All Micro Results               Procedure  Component  Value  Units  Date/Time           CULTURE, BLOOD [[id number]]  Collected:  03/01/19 1335            Order Status:  Completed  Specimen:  Blood  Updated:  03/03/19 0704                Special Requests:  --                  RIGHT   FOREARM                   GRAM STAIN  GRAM POSITIVE COCCI                ANAEROBIC BOTTLE POSITIVE                                     RESULTS VERIFIED, PHONED TO AND READ BACK BY T.LITTLETON,RN @ 0157 03/03/19 MM                         Culture result:                 CULTURE IN PROGRESS,FURTHER UPDATES TO FOLLOW  REFER TO BIOFIRE PANEL [id number]           BLOOD CULTURE ID PANEL [[id number]]  (Abnormal)  Collected:  03/01/19 1335            Order Status:  Completed  Specimen:  Blood  Updated:  03/03/19 0651              Acc. no. from Micro Order  [id number]                Staphylococcus  DETECTED                    Comment:  RESULTS VERIFIED, PHONED TO AND READ BACK BY   Royetta Car @ [phone] ON 03/03/2019 AK.                             mecA (Methicillin-Resistance Genes)  NOT DETECTED                    INTERPRETATION                 Gram positive cocci in clusters. Identified by realtime PCR as  Coagulase negative Staphylococci                       Comment:  A single positive culture of coagulase negative Staph is likely to be a contaminant in adult patients. Consider discontinuation of antibiotics  for gram positive bloodstream infections if patient asymptomatic.  THIS TEST DOES NOT REPLACE SENSITIVITY TESTING.                     CULTURE, BLOOD [[id number]]  Collected:   03/03/19 0526            Order Status:  Completed  Specimen:  Blood  Updated:  03/03/19 0625           CULTURE, BLOOD [[id number]]  Collected:  03/03/19 0530            Order Status:  Completed  Specimen:  Blood  Updated:  03/03/19 0625           CULTURE, BLOOD [[id number]]              Order Status:  Canceled  Specimen:  Blood             CULTURE, BLOOD [[id number]]              Order Status:  Canceled  Specimen:  Blood             CULTURE, BLOOD [[id number]]  Collected:  03/01/19 1335            Order Status:  Completed  Specimen:  Blood  Updated:  03/02/19 1347                Special Requests:  --                  RIGHT   Antecubital                   Culture result:  NO GROWTH AFTER 23 HOURS                INFLUENZA A & B AG (RAPID TEST) [[id number]]  Collected:  03/01/19 0918            Order Status:  Completed  Specimen:  Nasopharyngeal from Nasal washing  Updated:  03/01/19 0939                Influenza A Ag  NEGATIVE                   Comment:  NEGATIVE FOR THE PRESENCE OF INFLUENZA A ANTIGEN   INFECTION DUE TO INFLUENZA A CANNOT BE RULED OUT.   BECAUSE THE ANTIGEN PRESENT IN THE SAMPLE MAY BE BELOW   THE DETECTION LIMIT OF THE TEST.   A NEGATIVE TEST IS PRESUMPTIVE AND IT IS RECOMMENDED THAT THESE RESULTS BE CONFIRMED BY VIRAL CULTURE OR AN FDA-CLEARED INFLUENZA A AND B MOLECULAR ASSAY.                             Influenza B Ag  NEGATIVE                   Comment:  NEGATIVE FOR THE PRESENCE OF INFLUENZA B ANTIGEN   INFECTION DUE TO INFLUENZA B CANNOT BE RULED OUT.   BECAUSE THE ANTIGEN PRESENT IN THE SAMPLE MAY BE BELOW   THE DETECTION LIMIT OF THE TEST.   A NEGATIVE TEST IS PRESUMPTIVE AND IT IS RECOMMENDED THAT THESE RESULTS BE CONFIRMED BY VIRAL CULTURE OR AN FDA-CLEARED INFLUENZA A AND B MOLECULAR ASSAY.                             Source  Nasopharyngeal                     Current Meds:     Current Facility-Administered Medications          Medication  Dose  Route  Frequency           ?  0.9% sodium  chloride infusion   100 mL/hr  IntraVENous  CONTINUOUS     ?  vancomycin (VANCOCIN) 1500 mg in NS 500 ml infusion   1,500 mg  IntraVENous  Q12H     ?  acetaminophen (TYLENOL) tablet 650 mg   650 mg  Oral  Q6H PRN     ?  tuberculin injection 5 Units   5 Units  IntraDERMal  ONCE     ?  famotidine (PEPCID) tablet 20 mg   20 mg  Oral  BID     ?  LORazepam (ATIVAN) injection 1 mg   1 mg  IntraVENous  ONCE PRN     ?  promethazine (PHENERGAN) with saline injection 12.5 mg   12.5 mg  IntraVENous  Q6H PRN           ?  sodium chloride (NS) flush 5-40 mL   5-40 mL  IntraVENous  Q8H           ?  sodium chloride (NS) flush 5-40 mL   5-40 mL  IntraVENous  PRN     ?  diphenhydrAMINE (BENADRYL) capsule 25 mg   25 mg  Oral  Q4H PRN     ?  ondansetron (ZOFRAN) injection 4 mg   4 mg  IntraVENous  Q4H PRN     ?  senna-docusate (PERICOLACE) 8.6-50 mg per tablet 2 Tab   2 Tab  Oral  DAILY PRN     ?  heparin (porcine) injection 5,000 Units   5,000 Units  SubCUTAneous  Q8H     ?  albuterol-ipratropium (DUO-NEB) 2.5 MG-0.5 MG/3 ML  3 mL  Nebulization  Q6H PRN     ?  piperacillin-tazobactam (ZOSYN) 3.375 g in 0.9% sodium chloride (MBP/ADV) 100 mL   3.375 g  IntraVENous  Q8H     ?  morphine injection 1 mg   1 mg  IntraVENous  Q4H PRN           ?  influenza vaccine 2019-20 (6 mos+)(PF) (FLUARIX/FLULAVAL/FLUZONE QUAD) injection 0.5 mL   0.5 mL  IntraMUSCular  PRIOR TO DISCHARGE           Other Studies:      [place] Abd Comp      Result Date: 03/01/2019   ABDOMINAL ULTRASOUND. HISTORY: Elevated liver enzymes, jaundice and abdominal pain. COMPARISON: None FINDINGS: Pancreas is grossly unremarkable although the tail is partially obscured by overlying bowel gas.. Aorta is normal caliber, 2.6 cm. The IVC is  patent. The enlarged spleen has a homogenous echotexture and measures 23 cm. Left kidney measures 15.1 cm included upper pole cyst calcification. No hydronephrosis. Right kidney is unremarkable without hydronephrosis and measures 13.6 cm. Renal  echogenicity  is normal, the right kidney is hypoechoic to the liver. The intrahepatic biliary tree is not dilated. There is hepatopetal flow in the portal vein. Small echogenic hemangioma right lobe liver, 9 x 13 mm. The gallbladder wall is thickened, up to 8 mm.  Multiple small echogenic shadowing gallstones. The common bile duct is not dilated, 6 mm.       IMPRESSION: Cholelithiasis with gallbladder wall thickening, possibly acute or chronic cholecystitis. No biliary tree obstruction.       Xr Chest Port      Result Date: 03/01/2019   1 View portable chest x-ray 03/01/2019 8:32 AM Indication: Chest pain Comparison: 12/08/2010 Findings: This portable upright AP chest of 0809 shows the monitoring leads overlying the chest. Lung volumes are low, only slight crowding though suggested at  the bases. Cardiomediastinal silhouette within normal limits. No confluent infiltrate, no significant effusion. Vascularity seems appropriate.       IMPRESSION: Mild pulmonary hypoinflation. Otherwise no acute infiltrate suggested.                Assessment and Plan:           Hospital Problems  as of 03/03/2019  Date Reviewed:  03/01/2019                         Codes  Class  Noted - Resolved  POA              Pancytopenia ([place])  ICD-10-CM: D66.440   ICD-9-CM: 284.19    03/02/2019 - Present  Unknown                        Jaundice  ICD-10-CM: R17   ICD-9-CM: 782.4    03/01/2019 - Present  Unknown                        CAD (coronary artery disease)  ICD-10-CM: I25.10   ICD-9-CM: 414.00    03/01/2019 - Present  Unknown                        COPD  ICD-9-CM: 496    03/01/2019 - Present  Unknown                        Normocytic anemia  ICD-10-CM: D64.9   ICD-9-CM: 285.9    03/01/2019 - Present  Unknown                        Lactic acidosis  ICD-10-CM: E87.2   ICD-9-CM: 276.2    03/01/2019 - Present  Unknown                        * (Principal) Elevated liver enzymes  ICD-10-CM: R74.8   ICD-9-CM: 790.5    03/01/2019 - Present  Unknown                           Plan:      Mr. 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    ??   Abdominal Pain, Jaundice, Hepatocellular pattern of elevated Liver Enzymes    Cholecystitis due to cholelithiasis   Hap A infection (IgM positive)   - LFTs down trending.    - Normal tylenol level but drug screen for amphetamines.    - ultrasound Abd : Cholelithiasis within the gallbladder with wall thickening.  No biliary tree obstruction.   - Hepatitis panel, HIV pending   - zosyn (3/7)   - GI consult : MRCP today.   - will defer to GI for Hap A management: vaccine +/- Ig         Lactic acidosis: improving   - Afrebril, no leukocytosis on arrival.  CXR showed no acute infectious process. UA is neg    - BCx: one bottle positive for GPC coag neg likely comtaminant. 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 Along with splenomegaly, elevated LDH likely be causing destruction in the enlarged spleen.   - stool occult neg.   - peripheral smear   ??   MI s/p stents (3-468yrago)   - not on any meds   ??   COPD   - not on any meds   - nebs prn   - o2 supplement as needed      Headache S/p Fall (prior to admission)   - did not hit his head or LOC per patient   - CT Head w/o contrast : neg for acute abnormalities      Diet:  DIET NUTRITIONAL SUPPLEMENTS   DIET NPO   DVT PPx: heparin sq   Code: Full Code         Medical Decision Making:      Labs/Imaging reviewed.   Additional information obtained from nursing staff.   Patient is high risk due to medical condition and comorbidities.   Plan discussed with nursing staff.   Plan discussed with patient/family.  All questions/concerns were addressed. Pt/family agrees with the plan.                Signed By:  BaHelmut MusterMD           March 03, 2019

## 2019-03-03 NOTE — Progress Notes (Signed)
Hourly rounds performed.  All needs meet.  Nausea and pain managed per MAR.  Patient did not tolerate dinner well, ate 25% and then vomited. Nausea and vomiting, and pain managed per MAR.    Bed low/locked.  Call light within reach.  Patient denies needs at this time.  Will continue to monitor and report to oncoming RN

## 2019-03-03 NOTE — Progress Notes (Signed)
Problem: Falls - Risk of  Goal: *Absence of Falls  Description  Document Bridgette Habermann Fall Risk and appropriate interventions in the flowsheet.  Outcome: Progressing Towards Goal  Note: Fall Risk Interventions:  Mobility Interventions: Bed/chair exit alarm         Medication Interventions: Bed/chair exit alarm    Elimination Interventions: Bed/chair exit alarm    History of Falls Interventions: Bed/chair exit alarm         Problem: Patient Education: Go to Patient Education Activity  Goal: Patient/Family Education  Outcome: Progressing Towards Goal     Problem: Nutrition Deficit  Goal: *Optimize nutritional status  Outcome: Progressing Towards Goal

## 2019-03-03 NOTE — Progress Notes (Signed)
MRI called and said patient could not tolerate entire MRI exam, despite medication given prior to procedure at patient's request.  Will make MD aware.

## 2019-03-04 ENCOUNTER — Inpatient Hospital Stay: Admit: 2019-03-04 | Payer: TRICARE (CHAMPUS) | Primary: Family Medicine

## 2019-03-04 LAB — CBC WITH AUTO DIFFERENTIAL
Basophils %: 0 % (ref 0.0–2.0)
Basophils Absolute: 0 10*3/uL (ref 0.0–0.2)
Eosinophils %: 2 % (ref 0.5–7.8)
Eosinophils Absolute: 0.1 10*3/uL (ref 0.0–0.8)
Granulocyte Absolute Count: 0 10*3/uL (ref 0.0–0.5)
Hematocrit: 29.3 % — ABNORMAL LOW (ref 41.1–50.3)
Hemoglobin: 9.4 g/dL — ABNORMAL LOW (ref 13.6–17.2)
Immature Granulocytes: 1 % (ref 0.0–5.0)
Lymphocytes %: 37 % (ref 13–44)
Lymphocytes Absolute: 1.1 10*3/uL (ref 0.5–4.6)
MCH: 30.1 PG (ref 26.1–32.9)
MCHC: 32.1 g/dL (ref 31.4–35.0)
MCV: 93.9 FL (ref 79.6–97.8)
MPV: 11.7 FL (ref 9.4–12.3)
Monocytes %: 10 % (ref 4.0–12.0)
Monocytes Absolute: 0.3 10*3/uL (ref 0.1–1.3)
NRBC Absolute: 0 10*3/uL (ref 0.0–0.2)
Neutrophils %: 50 % (ref 43–78)
Neutrophils Absolute: 1.5 10*3/uL — ABNORMAL LOW (ref 1.7–8.2)
Platelets: 140 10*3/uL — ABNORMAL LOW (ref 150–450)
RBC: 3.12 M/uL — ABNORMAL LOW (ref 4.23–5.6)
RDW: 15.6 % — ABNORMAL HIGH (ref 11.9–14.6)
WBC: 3 10*3/uL — ABNORMAL LOW (ref 4.3–11.1)

## 2019-03-04 LAB — CULTURE, BLOOD 1: Gram Stain Result: POSITIVE

## 2019-03-04 LAB — BASIC METABOLIC PANEL
Anion Gap: 8 mmol/L (ref 7–16)
BUN: 11 MG/DL (ref 6–23)
CO2: 22 mmol/L (ref 21–32)
Calcium: 7.7 MG/DL — ABNORMAL LOW (ref 8.3–10.4)
Chloride: 111 mmol/L — ABNORMAL HIGH (ref 98–107)
Creatinine: 0.84 MG/DL (ref 0.8–1.5)
EGFR IF NonAfrican American: >60 mL/min/{1.73_m2} (ref >60)
GFR African American: >60 mL/min/{1.73_m2} (ref >60)
Glucose: 99 mg/dL (ref 65–100)
Potassium: 4 mmol/L (ref 3.5–5.1)
Sodium: 141 mmol/L (ref 136–145)

## 2019-03-04 LAB — HEPATIC FUNCTION PANEL
A-G Ratio: 0.4 — ABNORMAL LOW (ref 1.2–3.5)
ALT (SGPT): 425 U/L — ABNORMAL HIGH (ref 12–65)
ALT: 425 U/L — ABNORMAL HIGH (ref 12–65)
AST (SGOT): 417 U/L — ABNORMAL HIGH (ref 15–37)
AST: 417 U/L — ABNORMAL HIGH (ref 15–37)
Albumin/Globulin Ratio: 0.4 — ABNORMAL LOW (ref 1.2–3.5)
Albumin: 1.5 g/dL — ABNORMAL LOW (ref 3.5–5.0)
Albumin: 1.5 g/dL — ABNORMAL LOW (ref 3.5–5.0)
Alk. phosphatase: 185 U/L — ABNORMAL HIGH (ref 50–136)
Alkaline Phosphatase: 185 U/L — ABNORMAL HIGH (ref 50–136)
Bilirubin, Direct: 3.7 MG/DL — ABNORMAL HIGH (ref <0.4)
Bilirubin, direct: 3.7 MG/DL — ABNORMAL HIGH (ref <0.4)
Bilirubin, total: 4.2 MG/DL — ABNORMAL HIGH (ref 0.2–1.1)
Globulin: 4.2 g/dL — ABNORMAL HIGH (ref 2.3–3.5)
Globulin: 4.2 g/dL — ABNORMAL HIGH (ref 2.3–3.5)
Protein, total: 5.7 g/dL — ABNORMAL LOW (ref 6.3–8.2)
Total Bilirubin: 4.2 MG/DL — ABNORMAL HIGH (ref 0.2–1.1)
Total Protein: 5.7 g/dL — ABNORMAL LOW (ref 6.3–8.2)

## 2019-03-04 LAB — PATHOLOGIST REVIEW SMEARS

## 2019-03-04 LAB — LACTIC ACID
Lactic Acid: 1.8 MMOL/L (ref 0.4–2.0)
Lactic acid: 1.8 MMOL/L (ref 0.4–2.0)

## 2019-03-04 LAB — CERULOPLASMIN
CERULOPLASMIN: 45.4 mg/dL — ABNORMAL HIGH (ref 16.0–31.0)
Ceruloplasmin: 45.4 mg/dL — ABNORMAL HIGH (ref 16.0–31.0)

## 2019-03-04 LAB — CBC WITH AUTOMATED DIFF
ABS. BASOPHILS: 0 10*3/uL (ref 0.0–0.2)
ABS. EOSINOPHILS: 0.1 10*3/uL (ref 0.0–0.8)
ABS. IMM. GRANS.: 0 10*3/uL (ref 0.0–0.5)
ABS. LYMPHOCYTES: 1.1 10*3/uL (ref 0.5–4.6)
ABS. MONOCYTES: 0.3 10*3/uL (ref 0.1–1.3)
ABS. NEUTROPHILS: 1.5 10*3/uL — ABNORMAL LOW (ref 1.7–8.2)
ABSOLUTE NRBC: 0 10*3/uL (ref 0.0–0.2)
BASOPHILS: 0 % (ref 0.0–2.0)
EOSINOPHILS: 2 % (ref 0.5–7.8)
HCT: 29.3 % — ABNORMAL LOW (ref 41.1–50.3)
HGB: 9.4 g/dL — ABNORMAL LOW (ref 13.6–17.2)
IMMATURE GRANULOCYTES: 1 % (ref 0.0–5.0)
LYMPHOCYTES: 37 % (ref 13–44)
MCH: 30.1 PG (ref 26.1–32.9)
MCHC: 32.1 g/dL (ref 31.4–35.0)
MCV: 93.9 FL (ref 79.6–97.8)
MONOCYTES: 10 % (ref 4.0–12.0)
MPV: 11.7 FL (ref 9.4–12.3)
NEUTROPHILS: 50 % (ref 43–78)
PLATELET: 140 10*3/uL — ABNORMAL LOW (ref 150–450)
RBC: 3.12 M/uL — ABNORMAL LOW (ref 4.23–5.6)
RDW: 15.6 % — ABNORMAL HIGH (ref 11.9–14.6)
WBC: 3 10*3/uL — ABNORMAL LOW (ref 4.3–11.1)

## 2019-03-04 LAB — METABOLIC PANEL, BASIC
Anion gap: 8 mmol/L (ref 7–16)
BUN: 11 MG/DL (ref 6–23)
CO2: 22 mmol/L (ref 21–32)
Calcium: 7.7 MG/DL — ABNORMAL LOW (ref 8.3–10.4)
Chloride: 111 mmol/L — ABNORMAL HIGH (ref 98–107)
Creatinine: 0.84 MG/DL (ref 0.8–1.5)
GFR est AA: >60 mL/min/{1.73_m2} (ref >60)
GFR est non-AA: >60 mL/min/{1.73_m2} (ref >60)
Glucose: 99 mg/dL (ref 65–100)
Potassium: 4 mmol/L (ref 3.5–5.1)
Sodium: 141 mmol/L (ref 136–145)

## 2019-03-04 LAB — CULTURE, BLOOD: GRAM STAIN: POSITIVE

## 2019-03-04 MED ORDER — SUCCINYLCHOLINE CHLORIDE 20 MG/ML INJECTION
20 mg/mL | INTRAMUSCULAR | Status: DC | PRN
Start: 2019-03-04 — End: 2019-03-04
  Administered 2019-03-04: 18:00:00 via INTRAVENOUS

## 2019-03-04 MED ORDER — PROPOFOL 10 MG/ML IV EMUL
10 mg/mL | INTRAVENOUS | Status: DC | PRN
Start: 2019-03-04 — End: 2019-03-04
  Administered 2019-03-04: 18:00:00 via INTRAVENOUS

## 2019-03-04 MED ORDER — LIDOCAINE (PF) 20 MG/ML (2 %) IJ SOLN
20 mg/mL (2 %) | INTRAMUSCULAR | Status: DC | PRN
Start: 2019-03-04 — End: 2019-03-04
  Administered 2019-03-04: 18:00:00 via INTRAVENOUS

## 2019-03-04 MED ORDER — SUCCINYLCHOLINE CHLORIDE 200 MG/10 ML (20 MG/ML) INTRAVENOUS SYRINGE
200 mg/10 mL (20 mg/mL) | INTRAVENOUS | Status: AC
Start: 2019-03-04 — End: ?

## 2019-03-04 MED ORDER — ONDANSETRON (PF) 4 MG/2 ML INJECTION
4 mg/2 mL | INTRAMUSCULAR | Status: DC | PRN
Start: 2019-03-04 — End: 2019-03-04
  Administered 2019-03-04: 19:00:00 via INTRAVENOUS

## 2019-03-04 MED ORDER — PROPOFOL 10 MG/ML IV EMUL
10 mg/mL | INTRAVENOUS | Status: AC
Start: 2019-03-04 — End: ?

## 2019-03-04 MED ORDER — DEXAMETHASONE SODIUM PHOSPHATE 4 MG/ML IJ SOLN
4 mg/mL | INTRAMUSCULAR | Status: DC | PRN
Start: 2019-03-04 — End: 2019-03-04
  Administered 2019-03-04: 19:00:00 via INTRAVENOUS

## 2019-03-04 MED ORDER — INDOMETHACIN 50 MG RECTAL SUPPOSITORY
50 mg | RECTAL | Status: DC | PRN
Start: 2019-03-04 — End: 2019-03-04

## 2019-03-04 MED ORDER — GLUCAGON 1 MG INJECTION
1 mg | INTRAMUSCULAR | Status: DC | PRN
Start: 2019-03-04 — End: 2019-03-04

## 2019-03-04 MED ORDER — DEXAMETHASONE SODIUM PHOSPHATE 10 MG/ML IJ SOLN
10 mg/mL | INTRAMUSCULAR | Status: AC
Start: 2019-03-04 — End: ?

## 2019-03-04 MED ORDER — ROCURONIUM 10 MG/ML IV
10 mg/mL | INTRAVENOUS | Status: DC | PRN
Start: 2019-03-04 — End: 2019-03-04
  Administered 2019-03-04: 18:00:00 via INTRAVENOUS

## 2019-03-04 MED ORDER — ONDANSETRON (PF) 4 MG/2 ML INJECTION
4 mg/2 mL | INTRAMUSCULAR | Status: AC
Start: 2019-03-04 — End: ?

## 2019-03-04 MED ORDER — LACTATED RINGERS IV
INTRAVENOUS | Status: DC
Start: 2019-03-04 — End: 2019-03-05
  Administered 2019-03-04 – 2019-03-05 (×2): via INTRAVENOUS

## 2019-03-04 MED ORDER — IOPAMIDOL 76 % IV SOLN
76 % | Freq: Once | INTRAVENOUS | Status: AC
Start: 2019-03-04 — End: 2019-03-04
  Administered 2019-03-04: 19:00:00

## 2019-03-04 MED ORDER — LIDOCAINE (PF) 20 MG/ML (2 %) IJ SOLN
20 mg/mL (2 %) | INTRAMUSCULAR | Status: AC
Start: 2019-03-04 — End: ?

## 2019-03-04 MED ORDER — ROCURONIUM 10 MG/ML IV
10 mg/mL | INTRAVENOUS | Status: AC
Start: 2019-03-04 — End: ?

## 2019-03-04 MED ORDER — IOPAMIDOL 76 % IV SOLN
76 % | Freq: Once | INTRAVENOUS | Status: DC
Start: 2019-03-04 — End: 2019-03-04

## 2019-03-04 MED FILL — ONDANSETRON (PF) 4 MG/2 ML INJECTION: 4 mg/2 mL | INTRAMUSCULAR | Qty: 2

## 2019-03-04 MED FILL — PIPERACILLIN-TAZOBACTAM 3.375 GRAM IV SOLR: 3.375 gram | INTRAVENOUS | Qty: 3.38

## 2019-03-04 MED FILL — MORPHINE 2 MG/ML INJECTION: 2 mg/mL | INTRAMUSCULAR | Qty: 1

## 2019-03-04 MED FILL — PROPOFOL 10 MG/ML IV EMUL: 10 mg/mL | INTRAVENOUS | Qty: 20

## 2019-03-04 MED FILL — XYLOCAINE-MPF 20 MG/ML (2 %) INJECTION SOLUTION: 20 mg/mL (2 %) | INTRAMUSCULAR | Qty: 5

## 2019-03-04 MED FILL — SUCCINYLCHOLINE CHLORIDE 200 MG/10 ML (20 MG/ML) INTRAVENOUS SYRINGE: 200 mg/10 mL (20 mg/mL) | INTRAVENOUS | Qty: 10

## 2019-03-04 MED FILL — PANTOPRAZOLE 40 MG TAB, DELAYED RELEASE: 40 mg | ORAL | Qty: 1

## 2019-03-04 MED FILL — ROCURONIUM 10 MG/ML IV: 10 mg/mL | INTRAVENOUS | Qty: 5

## 2019-03-04 MED FILL — DEXAMETHASONE SODIUM PHOSPHATE 10 MG/ML IJ SOLN: 10 mg/mL | INTRAMUSCULAR | Qty: 1

## 2019-03-04 MED FILL — PROMETHAZINE 25 MG/ML INJECTION: 25 mg/mL | INTRAMUSCULAR | Qty: 1

## 2019-03-04 MED FILL — HEPARIN (PORCINE) 5,000 UNIT/ML IJ SOLN: 5000 unit/mL | INTRAMUSCULAR | Qty: 1

## 2019-03-04 MED FILL — SODIUM CHLORIDE 0.9 % INJECTION: INTRAMUSCULAR | Qty: 10

## 2019-03-04 MED FILL — ISOVUE-370  76 % INTRAVENOUS SOLUTION: 370 mg iodine /mL (76 %) | INTRAVENOUS | Qty: 100

## 2019-03-04 NOTE — Progress Notes (Signed)
SCDS on patient at this time. PACU called to let them know procedure is starting. Will call back when complete.

## 2019-03-04 NOTE — Consults (Addendum)
[facility]  [address], [address]  [place] [address]  [phone]    H&P/Consult Note/Progress Note:   Vincent Smith  MRN: [id number]  DOB:Dec 31, 1960  Age:58 y.o.    HPI: Vincent Smith is a 58 y.o. male who has PMHx of COPD, seizures, CAD s/p MI with stents (3-4 years ago, not on [facility] who we are asked by GI to see for cholecystectomy.  Pt initially presented to the ED on 03/01/2019 with vague complaints of malaise, fatigue, fever, poor appetite, and chest pain. Pt was found to have lactic acidosis, pancytopenia and splenomegaly, jaundice with elevated LFTs, tbili, and Hepatitis A. He is homeless and follows with the [place].  Abd US demonstrates gallstones and thick wall gallbladder with acute vs chronic cholecystitis. GI was consulted for evaluation and ordered MRCP which pt has not been able to complete due to claustrophobia.  Pt is going for ERCP today. WBD 3, PLTs 140.  tbili 4.2, LFTs trending down.  Lactic 1.8 and trending down. Blood cultures with GPCs, pt on zosyn and vanco. General surgery was consulted for cholecystectomy.     HIV results pending.  Pt denies h/o abdominal surgery.    [place] Abd 03/01/2019:  FINDINGS: Pancreas is grossly unremarkable although the tail is partially  obscured by overlying bowel gas.. Aorta is normal caliber, 2.6 cm. The IVC is  patent. The enlarged spleen has a homogenous echotexture and measures 23 cm.   Left kidney measures 15.1 cm included upper pole cyst calcification. No  hydronephrosis. Right kidney is unremarkable without hydronephrosis and measures  13.6 cm. Renal echogenicity is normal, the right kidney is hypoechoic to the  liver. The intrahepatic biliary tree is not dilated. There is hepatopetal flow  in the portal vein. Small echogenic hemangioma right lobe liver, 9 x 13 mm. The  gallbladder wall is thickened, up to 8 mm. Multiple small echogenic shadowing  gallstones. The common bile duct is not dilated, 6 mm.    ??  IMPRESSION  IMPRESSION: Cholelithiasis with gallbladder wall thickening, possibly acute or  chronic cholecystitis. No biliary tree obstruction.    Past Medical History:   Diagnosis Date   ??? CAD (coronary artery disease)    ??? COPD    ??? Gastrointestinal disorder     acid reflux and ulcers   ??? Hypertension    ??? Seizures ([place])      Past Surgical History:   Procedure Laterality Date   ??? CARDIAC SURG PROCEDURE UNLIST      2 stents     Current Facility-Administered Medications   Medication Dose Route Frequency   ??? diazePAM (VALIUM) tablet 7.5 mg  7.5 mg Oral ONCE PRN   ??? pantoprazole (PROTONIX) tablet 40 mg  40 mg Oral DAILY   ??? acetaminophen (TYLENOL) tablet 650 mg  650 mg Oral Q6H PRN   ??? promethazine (PHENERGAN) with saline injection 12.5 mg  12.5 mg IntraVENous Q6H PRN   ??? sodium chloride (NS) flush 5-40 mL  5-40 mL IntraVENous Q8H   ??? sodium chloride (NS) flush 5-40 mL  5-40 mL IntraVENous PRN   ??? diphenhydrAMINE (BENADRYL) capsule 25 mg  25 mg Oral Q4H PRN   ??? ondansetron (ZOFRAN) injection 4 mg  4 mg IntraVENous Q4H PRN   ??? senna-docusate (PERICOLACE) 8.6-50 mg per tablet 2 Tab  2 Tab Oral DAILY PRN   ??? heparin (porcine) injection 5,000 Units  5,000 Units SubCUTAneous Q8H   ???  albuterol-ipratropium (DUO-NEB) 2.5 MG-0.5 MG/3 ML  3 mL Nebulization Q6H PRN   ??? piperacillin-tazobactam (ZOSYN) 3.375 g in 0.9% sodium chloride (MBP/ADV) 100 mL  3.375 g IntraVENous Q8H   ??? morphine injection 1 mg  1 mg IntraVENous Q4H PRN   ??? influenza vaccine 2019-20 (6 mos+)(PF) (FLUARIX/FLULAVAL/FLUZONE QUAD) injection 0.5 mL  0.5 mL IntraMUSCular PRIOR TO DISCHARGE     Patient has no known allergies.  Social History     Socioeconomic History   ??? Marital status: MARRIED     Spouse name: Not on file   ??? Number of children: Not on file   ??? Years of education: Not on file   ??? Highest education level: Not on file   Tobacco Use   ??? Smoking status: Former Smoker     Packs/day: 2.00   Substance and Sexual Activity   ??? Alcohol use: No    ??? Drug use: Not Currently     Types: Marijuana     Social History     Tobacco Use   Smoking Status Former Smoker   ??? Packs/day: 2.00     Family History   Problem Relation Age of Onset   ??? Hypertension Mother    ??? Hypertension Father    ??? Diabetes Father      ROS: The patient has difficulty with chest pain & shortness of breath.  + fever or chills.  Comprehensive review of systems was otherwise unremarkable except as noted above.    Physical Exam:   Visit Vitals  BP 102/60   Pulse 68   Temp 97.7 ??F (36.5 ??C)   Resp 18   Ht 5' 10"  (1.778 m)   Wt 198 lb 4.8 oz (89.9 kg)   SpO2 98%   BMI 28.45 kg/m??     Constitutional: Alert, oriented, cooperative patient in no acute distress; appears stated age. Appears jaundiced.  Eyes:Sclera are icteric. EOMs intact  ENMT: no external lesions gross hearing normal; no obvious neck masses, no ear or lip lesions, nares normal  CV: RRR. Normal perfusion  Resp: No JVD.  Breathing is  non-labored; no audible wheezing.    GI: soft and mildly distended; diffuse tenderness, worse in RUQ.  Palpable splenomegaly. + umbilical hernia. No guarding.   Musculoskeletal: unremarkable with normal function. No embolic signs or cyanosis.   Neuro:  Oriented; moves all 4; no focal deficits  Psychiatric: normal affect and mood, no memory impairment    Recent vitals (if inpt):  Patient Vitals for the past 24 hrs:   BP Temp Pulse Resp SpO2   03/04/19 0823 102/60 97.7 ??F (36.5 ??C) 68 18 98 %   03/04/19 0444 96/54 97.4 ??F (36.3 ??C) 75 18 92 %   03/03/19 2331 98/51 98.8 ??F (37.1 ??C) 71 18 95 %   03/03/19 1943 100/63 99.2 ??F (37.3 ??C) 71 18 96 %   03/03/19 1523 112/72 96.7 ??F (35.9 ??C) 81 20 96 %   03/03/19 1156 95/51 97.6 ??F (36.4 ??C) 65 18 96 %       Labs:  Recent Labs     03/04/19  0451 03/03/19  0526  03/01/19  2123   WBC 3.0* 3.4*   < >  --    HGB 9.4* 9.9*   < >  --    PLT 140* 132*   < >  --    NA 141 142   < >  --    K 4.0 3.8   < >  --  CL 111* 112*   < >  --    CO2 22 22   < >  --     BUN 11 12   < >  --    CREA 0.84 0.81   < >  --    GLU 99 90   < >  --    PTP  --  15.8*  --   --    INR  --  1.2  --   --    APTT  --  41.6*  --   --    TBILI 4.2* 4.4*   < >  --    CBIL 3.7* 3.8*   < >  --    SGOT 417* 491*   < >  --    ALT 425* 572*   < >  --    AP 185* 187*   < >  --    NH4  --   --   --  22   TROIQ  --  <0.02*  --   --     < > = values in this interval not displayed.       Lab Results   Component Value Date/Time    WBC 3.0 (L) 03/04/2019 04:51 AM    HGB 9.4 (L) 03/04/2019 04:51 AM    PLATELET 140 (L) 03/04/2019 04:51 AM    Sodium 141 03/04/2019 04:51 AM    Potassium 4.0 03/04/2019 04:51 AM    Chloride 111 (H) 03/04/2019 04:51 AM    CO2 22 03/04/2019 04:51 AM    BUN 11 03/04/2019 04:51 AM    Creatinine 0.84 03/04/2019 04:51 AM    Glucose 99 03/04/2019 04:51 AM    INR 1.2 03/03/2019 05:26 AM    aPTT 41.6 (H) 03/03/2019 05:26 AM    Bilirubin, total 4.2 (H) 03/04/2019 04:51 AM    Bilirubin, direct 3.7 (H) 03/04/2019 04:51 AM    AST (SGOT) 417 (H) 03/04/2019 04:51 AM    ALT (SGPT) 425 (H) 03/04/2019 04:51 AM    Alk. phosphatase 185 (H) 03/04/2019 04:51 AM    Lipase 94 12/08/2010 12:50 AM    Ammonia 22 2019-03-26 09:23 PM    Troponin-I <0.05 12/08/2010 03:28 AM    Troponin-I, Qt. <0.02 (L) 03/03/2019 05:26 AM       I reviewed recent labs and recent radiologic studies.    I independently reviewed radiology images for studies I described above or studies I have ordered.   Admission date (for inpatients): March 26, 2019   * No surgery date entered *  Procedure(s):  ENDOSCOPIC RETROGRADE CHOLANGIOPANCREATOGRAPHY (ERCP)    ASSESSMENT/PLAN:  Problem List  Date Reviewed: 03/26/19          Codes Class Noted    Pancytopenia ([place]) ICD-10-CM: Z16.967  ICD-9-CM: 284.19  03/02/2019        Jaundice ICD-10-CM: R17  ICD-9-CM: 782.4  03/26/19        CAD (coronary artery disease) ICD-10-CM: I25.10  ICD-9-CM: 414.00  03-26-2019        COPD ICD-9-CM: 496  2019/03/26        Normocytic anemia ICD-10-CM: D64.9   ICD-9-CM: 285.9  2019-03-26        Lactic acidosis ICD-10-CM: E87.2  ICD-9-CM: 276.2  26-Mar-2019        * (Principal) Elevated liver enzymes ICD-10-CM: R74.8  ICD-9-CM: 790.5  03/26/2019            Principal Problem:    Elevated liver enzymes (03-26-2019)  Active Problems:    Jaundice (03/01/2019)      CAD (coronary artery disease) (03/01/2019)      COPD (03/01/2019)      Normocytic anemia (03/01/2019)      Lactic acidosis (03/01/2019)      Pancytopenia ([place]) (03/02/2019)       Plan:  GI plans for ERCP today which showed normal biliary tree and filling of gallbladder, therefore no acute cholecystitis or possible explanation of LFTs from the gallbladder.  Hep A IgM + and likely patient has acute Hepatitis A  Cholelithiasis with chronic cholecystitis would not produce these abnormalities.  Vague admission symptoms c/w viral hepatitis  It is not proper time to perform cholecystectomy, which could be done electively in the future as he recovers from Hep A  I discussed with Dr. Ralph Dowdy  Diet per GI  Pain control  Follow labs    I have personally performed a face-to-face diagnostic evaluation and management  service on this patient.    I have independently seen the patient.   I have independently obtained the above history from the patient/family.    I have independently examined the patient with above findings.  I have independently reviewed data/labs for this patient and developed the above plan of care (MDM).  Signed: Nicoletta Dress, MD, FACS

## 2019-03-04 NOTE — Progress Notes (Signed)
TRANSFER - IN REPORT:    Verbal report received from Joanna, RN on Vincent Smith  being received from [phone]   for ordered procedure      Report consisted of patient???s Situation, Background, Assessment and   Recommendations(SBAR).     Information from the following report(s) SBAR, Kardex, Procedure Summary, Recent Results and Procedure Verification was reviewed with the receiving nurse.    Opportunity for questions and clarification was provided.      Assessment completed upon patient???s arrival to unit and care assumed.

## 2019-03-04 NOTE — Progress Notes (Addendum)
Hospitalist Note     Admit Date:  03/01/2019  8:04 AM   Name:  Vincent Smith   Age:  58 y.o.  DOB:  1961-06-28   MRN:  [id number]   PCP:  Lacey Jensen, MD  Treatment Team: Attending Provider: Helmut Muster, MD; Utilization Review: Loyce Dys, RN; Consulting Provider: Emogene Morgan, MD; Care Manager: Colan Neptune, RN; Primary Nurse: Madie Reno, Beverley Fiedler, RN    HPI/Subjective:   Patient is seen and examined at bedside.    Patient was not able to complete MRCP yesterday due to claustrophobia despite given Valium 5 mg.  Reports improvement in his nausea and vomiting overnight.  Complaining of his food last night due to not liking the taste.  Intermittent hiccups overnight which has resolved this morning    Objective:     Patient Vitals for the past 24 hrs:   Temp Pulse Resp BP SpO2   03/04/19 0444 97.4 ??F (36.3 ??C) 75 18 96/54 92 %   03/03/19 2331 98.8 ??F (37.1 ??C) 71 18 98/51 95 %   03/03/19 1943 99.2 ??F (37.3 ??C) 71 18 100/63 96 %   03/03/19 1523 96.7 ??F (35.9 ??C) 81 20 112/72 96 %   03/03/19 1156 97.6 ??F (36.4 ??C) 65 18 95/51 96 %   03/03/19 0823 98 ??F (36.7 ??C) 75 18 111/70 92 %     Oxygen Therapy  O2 Sat (%): 92 % (03/04/19 0444)  Pulse via Oximetry: 67 beats per minute (03/01/19 1300)  O2 Device: Room air (03/01/19 0811)    Estimated body mass index is 28.45 kg/m?? as calculated from the following:    Height as of 03/02/19: _0  (1.778 m).    Weight as of this encounter: 89.9 kg (198 lb 4.8 oz).      Intake/Output Summary (Last 24 hours) at 03/04/2019 0745  Last data filed at 03/04/2019 0450  Gross per 24 hour   Intake 360 ml   Output 1050 ml   Net -690 ml       *Note that automatically entered I/Os may not be accurate; dependent on patient compliance with collection and accurate data entry by techs.    Physical Exam:     General:                   alert, awake, jaundice, ill appearing   Head:                        normocephalic, atraumatic  Eyes, Ears, nose:    PERRL, EOMI. Sclera icterus   Neck:                         supple, non-tender   Lungs:                       Crackles on the bases without any wheezing.  Cardiac:                     RRR, Normal S1 and S2.    Abdomen:                  Soft, distended, TTP diffusely without guarding  Extremities:               Warm, dry. No edema   Skin:  No rashes, no jaundice  Neuro:              Alert and oriented to person, time, place, situation. No gross focal neurological deficit  Psychiatric:                No anxiety, calm, cooperative        Data Review:  I have reviewed all labs, meds, and studies from the last 24 hours:    Recent Results (from the past 24 hour(s))   PLEASE READ & DOCUMENT PPD TEST IN 24 HRS    Collection Time: 03/03/19  8:30 AM   Result Value Ref Range    PPD Negative Negative    mm Induration 0 0 - 5 mm   EKG, 12 LEAD, SUBSEQUENT    Collection Time: 03/03/19 12:45 PM   Result Value Ref Range    Ventricular Rate 67 BPM    Atrial Rate 67 BPM    P-R Interval 158 ms    QRS Duration 114 ms    Q-T Interval 414 ms    QTC Calculation (Bezet) 437 ms    Calculated P Axis 60 degrees    Calculated R Axis 1 degrees    Calculated T Axis 14 degrees    Diagnosis       Normal sinus rhythm  Incomplete left bundle branch block  Borderline ECG  When compared with ECG of 01-Mar-2019 08:07,  Fusion complexes are no longer Present  Premature ventricular complexes are no longer Present  Premature supraventricular complexes are no longer Present  Confirmed by Tamala Julian  MD ([facility]), CHRISTOPHER H [phone]) on 03/03/2019 9:38:18 PM     METABOLIC PANEL, BASIC    Collection Time: 03/04/19  4:51 AM   Result Value Ref Range    Sodium 141 136 - 145 mmol/L    Potassium 4.0 3.5 - 5.1 mmol/L    Chloride 111 (H) 98 - 107 mmol/L    CO2 22 21 - 32 mmol/L    Anion gap 8 7 - 16 mmol/L    Glucose 99 65 - 100 mg/dL    BUN 11 6 - 23 MG/DL    Creatinine 0.84 0.8 - 1.5 MG/DL    GFR est AA >60 >60 ml/min/1.45m    GFR est non-AA >60 >60 ml/min/1.773m    Calcium 7.7 (L) 8.3 - 10.4 MG/DL   CBC WITH AUTOMATED DIFF    Collection Time: 03/04/19  4:51 AM   Result Value Ref Range    WBC 3.0 (L) 4.3 - 11.1 K/uL    RBC 3.12 (L) 4.23 - 5.6 M/uL    HGB 9.4 (L) 13.6 - 17.2 g/dL    HCT 29.3 (L) 41.1 - 50.3 %    MCV 93.9 79.6 - 97.8 FL    MCH 30.1 26.1 - 32.9 PG    MCHC 32.1 31.4 - 35.0 g/dL    RDW 15.6 (H) 11.9 - 14.6 %    PLATELET 140 (L) 150 - 450 K/uL    MPV 11.7 9.4 - 12.3 FL    ABSOLUTE NRBC 0.00 0.0 - 0.2 K/uL    NEUTROPHILS 50 43 - 78 %    LYMPHOCYTES 37 13 - 44 %    MONOCYTES 10 4.0 - 12.0 %    EOSINOPHILS 2 0.5 - 7.8 %    BASOPHILS 0 0.0 - 2.0 %    IMMATURE GRANULOCYTES 1 0.0 - 5.0 %    ABS. NEUTROPHILS 1.5 (L) 1.7 - 8.2 K/UL  ABS. LYMPHOCYTES 1.1 0.5 - 4.6 K/UL    ABS. MONOCYTES 0.3 0.1 - 1.3 K/UL    ABS. EOSINOPHILS 0.1 0.0 - 0.8 K/UL    ABS. BASOPHILS 0.0 0.0 - 0.2 K/UL    ABS. IMM. GRANS. 0.0 0.0 - 0.5 K/UL    RBC COMMENTS SLIGHT  ANISOCYTOSIS + POIKILOCYTOSIS        WBC COMMENTS OCCASIONAL      PLATELET COMMENTS SLIGHT      DF AUTOMATED     HEPATIC FUNCTION PANEL    Collection Time: 03/04/19  4:51 AM   Result Value Ref Range    Protein, total 5.7 (L) 6.3 - 8.2 g/dL    Albumin 1.5 (L) 3.5 - 5.0 g/dL    Globulin 4.2 (H) 2.3 - 3.5 g/dL    A-G Ratio 0.4 (L) 1.2 - 3.5      Bilirubin, total 4.2 (H) 0.2 - 1.1 MG/DL    Bilirubin, direct 3.7 (H) <0.4 MG/DL    Alk. phosphatase 185 (H) 50 - 136 U/L    AST (SGOT) 417 (H) 15 - 37 U/L    ALT (SGPT) 425 (H) 12 - 65 U/L   LACTIC ACID    Collection Time: 03/04/19  4:51 AM   Result Value Ref Range    Lactic acid 1.8 0.4 - 2.0 MMOL/L        All Micro Results     Procedure Component Value Units Date/Time    CULTURE, BLOOD [[id number]]  (Abnormal) Collected:  03/01/19 1335    Order Status:  Completed Specimen:  Blood Updated:  03/04/19 0712     Special Requests: --        RIGHT  FOREARM       GRAM STAIN GRAM POSITIVE COCCI         ANAEROBIC BOTTLE POSITIVE                RESULTS VERIFIED, PHONED TO AND READ BACK BY T.LITTLETON,RN @ 0157 03/03/19 MM           Culture result:       STAPHYLOCOCCUS SPECIES, COAGULASE NEGATIVE            REFER TO BIOFIRE PANEL [id number]            THIS ORGANISM MAY BE INDICATIVE OF CULTURE CONTAMINATION, HOWEVER, CLINICAL CORRELATION NEEDS TO BE EVALUATED, AS EACH CASE IS UNIQUE.          CULTURE, BLOOD [[id number]] Collected:  03/01/19 1335    Order Status:  Completed Specimen:  Blood Updated:  03/03/19 1200     Special Requests: --        RIGHT  Antecubital       Culture result: NO GROWTH 2 DAYS       BLOOD CULTURE ID PANEL [[id number]]  (Abnormal) Collected:  03/01/19 1335    Order Status:  Completed Specimen:  Blood Updated:  03/03/19 0651     Acc. no. from Micro Order [id number]     Staphylococcus DETECTED        Comment: RESULTS VERIFIED, PHONED TO AND READ BACK BY  Royetta Car @ [phone] ON 03/03/2019 AK.          mecA (Methicillin-Resistance Genes) NOT DETECTED        INTERPRETATION       Gram positive cocci in clusters. Identified by realtime PCR as  Coagulase negative Staphylococci           Comment: A single positive culture of coagulase  negative Staph is likely to be a contaminant in adult patients. Consider discontinuation of antibiotics for gram positive bloodstream infections if patient asymptomatic.  THIS TEST DOES NOT REPLACE SENSITIVITY TESTING.       CULTURE, BLOOD [[id number]] Collected:  03/03/19 0526    Order Status:  Completed Specimen:  Blood Updated:  03/03/19 0625    CULTURE, BLOOD [[id number]] Collected:  03/03/19 0530    Order Status:  Completed Specimen:  Blood Updated:  03/03/19 0625    CULTURE, BLOOD [[id number]]     Order Status:  Canceled Specimen:  Blood     CULTURE, BLOOD [[id number]]     Order Status:  Canceled Specimen:  Blood     INFLUENZA A & B AG (RAPID TEST) [[id number]] Collected:  03/01/19 0918    Order Status:  Completed Specimen:  Nasopharyngeal from Nasal washing Updated:  03/01/19 0939     Influenza A Ag NEGATIVE          Comment: NEGATIVE FOR THE PRESENCE OF INFLUENZA A ANTIGEN  INFECTION DUE TO INFLUENZA A CANNOT BE RULED OUT.  BECAUSE THE ANTIGEN PRESENT IN THE SAMPLE MAY BE BELOW  THE DETECTION LIMIT OF THE TEST.  A NEGATIVE TEST IS PRESUMPTIVE AND IT IS RECOMMENDED THAT THESE RESULTS BE CONFIRMED BY VIRAL CULTURE OR AN FDA-CLEARED INFLUENZA A AND B MOLECULAR ASSAY.          Influenza B Ag NEGATIVE         Comment: NEGATIVE FOR THE PRESENCE OF INFLUENZA B ANTIGEN  INFECTION DUE TO INFLUENZA B CANNOT BE RULED OUT.  BECAUSE THE ANTIGEN PRESENT IN THE SAMPLE MAY BE BELOW  THE DETECTION LIMIT OF THE TEST.  A NEGATIVE TEST IS PRESUMPTIVE AND IT IS RECOMMENDED THAT THESE RESULTS BE CONFIRMED BY VIRAL CULTURE OR AN FDA-CLEARED INFLUENZA A AND B MOLECULAR ASSAY.          Source Nasopharyngeal             Current Meds:  Current Facility-Administered Medications   Medication Dose Route Frequency   ??? diazePAM (VALIUM) tablet 7.5 mg  7.5 mg Oral ONCE PRN   ??? pantoprazole (PROTONIX) tablet 40 mg  40 mg Oral DAILY   ??? acetaminophen (TYLENOL) tablet 650 mg  650 mg Oral Q6H PRN   ??? promethazine (PHENERGAN) with saline injection 12.5 mg  12.5 mg IntraVENous Q6H PRN   ??? sodium chloride (NS) flush 5-40 mL  5-40 mL IntraVENous Q8H   ??? sodium chloride (NS) flush 5-40 mL  5-40 mL IntraVENous PRN   ??? diphenhydrAMINE (BENADRYL) capsule 25 mg  25 mg Oral Q4H PRN   ??? ondansetron (ZOFRAN) injection 4 mg  4 mg IntraVENous Q4H PRN   ??? senna-docusate (PERICOLACE) 8.6-50 mg per tablet 2 Tab  2 Tab Oral DAILY PRN   ??? heparin (porcine) injection 5,000 Units  5,000 Units SubCUTAneous Q8H   ??? albuterol-ipratropium (DUO-NEB) 2.5 MG-0.5 MG/3 ML  3 mL Nebulization Q6H PRN   ??? piperacillin-tazobactam (ZOSYN) 3.375 g in 0.9% sodium chloride (MBP/ADV) 100 mL  3.375 g IntraVENous Q8H   ??? morphine injection 1 mg  1 mg IntraVENous Q4H PRN   ??? influenza vaccine 2019-20 (6 mos+)(PF) (FLUARIX/FLULAVAL/FLUZONE QUAD) injection 0.5 mL  0.5 mL IntraMUSCular PRIOR TO DISCHARGE        Other Studies:    [place] Abd Comp    Result Date: 03/01/2019  ABDOMINAL ULTRASOUND. HISTORY: Elevated liver enzymes, jaundice and abdominal pain. COMPARISON: None FINDINGS: Pancreas is grossly unremarkable  although the tail is partially obscured by overlying bowel gas.. Aorta is normal caliber, 2.6 cm. The IVC is patent. The enlarged spleen has a homogenous echotexture and measures 23 cm. Left kidney measures 15.1 cm included upper pole cyst calcification. No hydronephrosis. Right kidney is unremarkable without hydronephrosis and measures 13.6 cm. Renal echogenicity is normal, the right kidney is hypoechoic to the liver. The intrahepatic biliary tree is not dilated. There is hepatopetal flow in the portal vein. Small echogenic hemangioma right lobe liver, 9 x 13 mm. The gallbladder wall is thickened, up to 8 mm. Multiple small echogenic shadowing gallstones. The common bile duct is not dilated, 6 mm.     IMPRESSION: Cholelithiasis with gallbladder wall thickening, possibly acute or chronic cholecystitis. No biliary tree obstruction.     Xr Chest Port    Result Date: March 15, 2019  1 View portable chest x-ray March 15, 2019 8:32 AM Indication: Chest pain Comparison: 12/08/2010 Findings: This portable upright AP chest of 0809 shows the monitoring leads overlying the chest. Lung volumes are low, only slight crowding though suggested at the bases. Cardiomediastinal silhouette within normal limits. No confluent infiltrate, no significant effusion. Vascularity seems appropriate.     IMPRESSION: Mild pulmonary hypoinflation. Otherwise no acute infiltrate suggested.          Assessment and Plan:     Hospital Problems as of 03/04/2019 Date Reviewed: 03/15/2019          Codes Class Noted - Resolved POA    Pancytopenia ([place]) ICD-10-CM: Y70.623  ICD-9-CM: 284.19  03/02/2019 - Present Unknown        Jaundice ICD-10-CM: R17  ICD-9-CM: 782.4  March 15, 2019 - Present Unknown        CAD (coronary artery disease) ICD-10-CM: I25.10   ICD-9-CM: 414.00  03/15/2019 - Present Unknown        COPD ICD-9-CM: 496  March 15, 2019 - Present Unknown        Normocytic anemia ICD-10-CM: D64.9  ICD-9-CM: 285.9  03/15/2019 - Present Unknown        Lactic acidosis ICD-10-CM: E87.2  ICD-9-CM: 276.2  03/15/2019 - Present Unknown        * (Principal) Elevated liver enzymes ICD-10-CM: R74.8  ICD-9-CM: 790.5  03-15-19 - Present Unknown              Plan:    Mr. Rodino is a 58 year old male with past medical history of MI (3-57yr ago) s/p 2 stents, COPD who presented to ED with many vague complaints.   ??  Abdominal Pain, Jaundice, Hepatocellular pattern of elevated Liver Enzymes   Cholecystitis due to cholelithiasis  Hap A infection (IgM positive)  - ultrasound Abd : Cholelithiasis within the gallbladder with wall thickening.  No biliary tree obstruction.  - HIV pending  - zosyn (March 15, 2023  - GI consult : ERCP today.  - Surgery Consult  - will defer to GI for Hap A management: vaccine +/- Ig      Lactic acidosis: resolved  - BCx: one bottle positive for GPC coag neg likely comtaminant. Repeat BCx pending      Pancytopenia w/ splenomegaly likely hemolysis  Normocytic Anemia  - iron, ferritin, TIBC most consistent with anemia of chronic illness. Along with splenomegaly, elevated LDH likely be causing destruction in the enlarged spleen.  - stool occult neg.  - peripheral smear  ??  MI s/p stents (3-429yrago)  - not on any meds  ??  COPD  - not on any meds  - nebs prn  - o2  supplement as needed    Headache S/p Fall (prior to admission)  - did not hit his head or LOC per patient  - CT Head w/o contrast : neg for acute abnormalities    Diet:  DIET NUTRITIONAL SUPPLEMENTS  DIET NPO  DVT PPx: heparin sq  Code: Full Code      Medical Decision Making:    Labs/Imaging reviewed.  Additional information obtained from nursing staff.  Patient is high risk due to medical condition and comorbidities.  Plan discussed with nursing staff.  Plan discussed with patient/family.  All questions/concerns were  addressed. Pt/family agrees with the plan.         Signed By: Helmut Muster, MD     March 04, 2019

## 2019-03-04 NOTE — Progress Notes (Addendum)
.  [facility] Progress Note             Date: 03/04/2019    GI Problem: hyperbilirubinemia    History of Present Illness:  Patient is a 58 y.o. male who is seen in consultation for hyperbilirubinemia.  Has bad generalized abdominal pain.  TB/ DB 4.0.  AST/ALT in 600 range.  US shows cholelithiasis and GB wall thickening.  Tried MRCP yesterday but patient could not tolerate due to claustrophobia so not done.  HAV IgM positive.    Hospital Medications:  Current Facility-Administered Medications   Medication Dose Route Frequency   ??? diazePAM (VALIUM) tablet 7.5 mg  7.5 mg Oral ONCE PRN   ??? pantoprazole (PROTONIX) tablet 40 mg  40 mg Oral DAILY   ??? acetaminophen (TYLENOL) tablet 650 mg  650 mg Oral Q6H PRN   ??? promethazine (PHENERGAN) with saline injection 12.5 mg  12.5 mg IntraVENous Q6H PRN   ??? sodium chloride (NS) flush 5-40 mL  5-40 mL IntraVENous Q8H   ??? sodium chloride (NS) flush 5-40 mL  5-40 mL IntraVENous PRN   ??? diphenhydrAMINE (BENADRYL) capsule 25 mg  25 mg Oral Q4H PRN   ??? ondansetron (ZOFRAN) injection 4 mg  4 mg IntraVENous Q4H PRN   ??? senna-docusate (PERICOLACE) 8.6-50 mg per tablet 2 Tab  2 Tab Oral DAILY PRN   ??? heparin (porcine) injection 5,000 Units  5,000 Units SubCUTAneous Q8H   ??? albuterol-ipratropium (DUO-NEB) 2.5 MG-0.5 MG/3 ML  3 mL Nebulization Q6H PRN   ??? piperacillin-tazobactam (ZOSYN) 3.375 g in 0.9% sodium chloride (MBP/ADV) 100 mL  3.375 g IntraVENous Q8H   ??? morphine injection 1 mg  1 mg IntraVENous Q4H PRN   ??? influenza vaccine 2019-20 (6 mos+)(PF) (FLUARIX/FLULAVAL/FLUZONE QUAD) injection 0.5 mL  0.5 mL IntraMUSCular PRIOR TO DISCHARGE       Objective:     Physical Exam:  Vitals:  Visit Vitals  BP 102/60   Pulse 68   Temp 97.7 ??F (36.5 ??C)   Resp 18   Ht 5\' 10"  (1.778 m)   Wt 89.9 kg (198 lb 4.8 oz)   SpO2 98%   BMI 28.45 kg/m??       General: No acute distress.  Skin:  Extremities and face reveal no rashes. No palmer erythema. No telangiectasias on the chest wall.   HEENT: Sclerae anicteric. No oral ulcers.  No abnormal pigmentation of the lips.  The neck is supple.  Cardiovascular: Regular rate and rhythm. No murmurs, gallops, or rubs.  Respiratory:  Comfortable breathing  With no accessory muscle use. Clear breath sounds with no wheezes, rales, or rhonchi.  GI:  Abdomen nondistended, soft, and nontender.  Normal active bowel sounds. No enlargement of the liver or spleen. No masses palpable.  Musculoskeletal:  No pitting edema of the lower legs.  Extremities have good range of motion.   Neurological:  Gross memory appears intact.  Patient is alert and oriented.  Psychiatric:  Mood appears appropriate with judgement intact.  Lymphatic:  No cervical or supraclavicular adenopathy.    Laboratory:    Recent Labs     03/04/19  0451   WBC 3.0*   RBC 3.12*   HGB 9.4*   HCT 29.3*   PLT 140*      Recent Labs     03/04/19  0451   GLU 99   NA 141   K 4.0   CL 111*   CO2 22   BUN 11  CREA 0.84   CA 7.7*     Recent Labs     03/03/19  0526   PTP 15.8*   INR 1.2   APTT 41.6*     Recent Labs     03/04/19  0451   CBIL 3.7*   SGOT 417*   AP 185*   ALB 1.5*   TP 5.7*       Assessment:       A 58 y.o. male with suspected choledocholithiasis, cholelithiasis, cholecystitis.  Also HAV.      Plan:       ERCP today  Surgery consult    Signed By: Shon Millet, MD     March 04, 2019

## 2019-03-04 NOTE — Procedures (Addendum)
ERCP Procedure Note    Indications: Elevated LFT's, abdominal pain, cholelithiasis    Anesthesia/Sedation: MAC IV     Pre-Procedure Physical:    Current Facility-Administered Medications   Medication Dose Route Frequency   ??? lactated Ringers infusion  75 mL/hr IntraVENous ENDO CONTINUOUS   ??? glucagon (GLUCAGEN) injection 0.5-1 mg  0.5-1 mg IntraVENous Multiple   ??? indomethacin (INDOCIN) rectal suppository 100 mg  100 mg Rectal PRN   ??? iopamidoL (ISOVUE-370) 76 % injection 50 mL  50 mL Other ONCE   ??? pantoprazole (PROTONIX) tablet 40 mg  40 mg Oral DAILY   ??? acetaminophen (TYLENOL) tablet 650 mg  650 mg Oral Q6H PRN   ??? promethazine (PHENERGAN) with saline injection 12.5 mg  12.5 mg IntraVENous Q6H PRN   ??? sodium chloride (NS) flush 5-40 mL  5-40 mL IntraVENous Q8H   ??? sodium chloride (NS) flush 5-40 mL  5-40 mL IntraVENous PRN   ??? diphenhydrAMINE (BENADRYL) capsule 25 mg  25 mg Oral Q4H PRN   ??? ondansetron (ZOFRAN) injection 4 mg  4 mg IntraVENous Q4H PRN   ??? senna-docusate (PERICOLACE) 8.6-50 mg per tablet 2 Tab  2 Tab Oral DAILY PRN   ??? heparin (porcine) injection 5,000 Units  5,000 Units SubCUTAneous Q8H   ??? albuterol-ipratropium (DUO-NEB) 2.5 MG-0.5 MG/3 ML  3 mL Nebulization Q6H PRN   ??? piperacillin-tazobactam (ZOSYN) 3.375 g in 0.9% sodium chloride (MBP/ADV) 100 mL  3.375 g IntraVENous Q8H   ??? morphine injection 1 mg  1 mg IntraVENous Q4H PRN   ??? influenza vaccine 2019-20 (6 mos+)(PF) (FLUARIX/FLULAVAL/FLUZONE QUAD) injection 0.5 mL  0.5 mL IntraMUSCular PRIOR TO DISCHARGE     Facility-Administered Medications Ordered in Other Encounters   Medication Dose Route Frequency   ??? lidocaine (PF) (XYLOCAINE) 20 mg/mL (2 %) injection   IntraVENous PRN   ??? propofoL (DIPRIVAN) 10 mg/mL injection   IntraVENous PRN   ??? rocuronium injection    PRN   ??? succinylcholine (ANECTINE) injection    PRN      Tramadol    Patient Vitals for the past 8 hrs:   BP Temp Pulse Resp SpO2   03/04/19 1402 105/69 ??? 70 16 100 %    03/04/19 1158 104/64 98.2 ??F (36.8 ??C) 66 16 95 %   03/04/19 0823 102/60 97.7 ??F (36.5 ??C) 68 18 98 %       Exam      Airway: clear   Heart: normal S1and S2    Lungs: clear bilateral  Abdomen: soft, nontender, bowel sounds present and normal in all quads   Mental Status: awake, alert and oriented to person, place and time          Procedure Details     Informed consent was obtained for the procedure, including conscious sedation. Risks of pancreatitis, infection, perforation, hemorrhage, adverse drug reaction and aspiration were discussed. The patient was placed in the prone position.  Based on the pre-procedure assessment, including review of the patient's medical history, medications, allergies, and review of systems, he had been deemed to be an appropriate candidate for conscious sedation; he was therefore sedated with the medications listed below.  He was monitored continuously with ECG tracing, pulse oximetry, blood pressure monitoring, and direct observation.      The side viewing duodenoscope was inserted through the mouth and advanced to the major duodenal papilla.  The common bile duct was selectively cannulated with the 44 sphinctertome over a 125 jag wire.  Cholangiogram taken.  Sphincterotomy was performed.  Balloon sweep of the bile duct was performed with 9-12 mm balloon.       Findings:   Esophagus- Normal.  Stomach- Normal.  Duodenum- Normal.    Major duodenal papilla- Normal.  Common bile duct- Normal.  Narrow/ not dilated.  Intrahepatic ducts- Normal.  Pancreatic duct- Not entered.  Gallbladder- Filled.    Therapies: Sphincterotomy    Specimens: None    Estimated Blood Loss: 0 cc           Complications:   None; patient tolerated the procedure well.           Attending Attestation:  I performed the procedure.     Impression:    Normal ERCP.  Sphincterotomy performed.  No bile duct stone found.    Recommendations:  Continue supportive care for HAV

## 2019-03-04 NOTE — Progress Notes (Addendum)
Hourly rounds performed.  All needs meet.  Patient tolerated clear diet well after ERCP.  Voiding: amber, clear.     Bed low/locked.  Call light within reach.  Patient denies needs at this time.  Will continue to monitor and report to oncoming RN

## 2019-03-04 NOTE — Progress Notes (Signed)
TRANSFER - OUT REPORT:    Verbal report given to Aleena (name) on Vincent Smith  being transferred to GI lab  (unit) for ordered procedure       Report consisted of patient???s Situation, Background, Assessment and   Recommendations(SBAR).     Information from the following report(s) SBAR was reviewed with the receiving nurse.    Lines:   Peripheral IV 03/01/19 Left Forearm (Active)   Site Assessment Clean, dry, & intact 03/04/2019  7:57 AM   Phlebitis Assessment 0 03/04/2019  7:57 AM   Infiltration Assessment 0 03/04/2019  7:57 AM   Dressing Status Clean, dry, & intact 03/04/2019  7:57 AM   Dressing Type Transparent 03/04/2019  7:57 AM   Hub Color/Line Status Patent;Infusing 03/04/2019  7:57 AM        Opportunity for questions and clarification was provided.

## 2019-03-04 NOTE — Anesthesia Pre-Procedure Evaluation (Addendum)
Relevant Problems   No relevant active problems       Anesthetic History               Review of Systems / Medical History  Patient summary reviewed and pertinent labs reviewed    Pulmonary    COPD: moderate               Neuro/Psych     seizures: well controlled         Cardiovascular    Hypertension          Past MI, CAD and cardiac stents (Stents times two)    Exercise tolerance: >4 METS     GI/Hepatic/Renal           Liver disease (Cirrhosis)     Endo/Other  Within defined limits           Other Findings              Physical Exam    Airway  Mallampati: II  TM Distance: 4 - 6 cm  Neck ROM: normal range of motion   Mouth opening: Normal     Cardiovascular  Regular rate and rhythm,  S1 and S2 normal,  no murmur, click, rub, or gallop  Rhythm: regular           Dental         Pulmonary  Breath sounds clear to auscultation               Abdominal         Other Findings            Anesthetic Plan    ASA: 3  Anesthesia type: general          Induction: Intravenous  Anesthetic plan and risks discussed with: Patient

## 2019-03-04 NOTE — Progress Notes (Signed)
CM followed up with patient regarding [facility] to [facility] and to see if he contacted the [facility] for [facility] to get him a place to stay.  Patient stated he has not called at this time.  The phone numbers are still at his bedside next to the phone.  CM explained patient will need to call as they want to speak with him and get their phone survey completed. Patient verbalized understanding and stated he will call when he has time.   CM will continue to follow patient during hospitalization.  Please contact CM for any new needs.

## 2019-03-04 NOTE — Progress Notes (Signed)
Nutrition Assessment for:   Follow up    Assessment: Admitted with abdominal pain, jaundice, hepatocellular pattern of elevated liver enzymes, cholecystitis due to cholelithiasis.  Unable to complete MRCP.  ERCP planned today.  GI following, surgery consulted.   Pt c/o hiccups at my visit, reports NV from yesterday resolved.  He indicates he "hasn't lost that much weight after all." Reports his UBW as 190#.  +mild temporal muscle loss.  He does not provide a quantifiable nutrition history.  He asks for ice chips and then dozes off.    DIET NUTRITIONAL SUPPLEMENTS Breakfast; Ensure Enlive  DIET NPO    Pt has consumed minimal when diet was active.    Anthropometrics:  Height: 5' 10" (177.8 cm), Weight: 89.9 kg (198 lb 4.8 oz), Weight Source: Bed, Body mass index is 28.45 kg/m??. BMI class of overweight.  Weight on admission Weight: 86.2 kg (190 lb), Weight Source: Patient stated.  Current measured weight is greater than stated UBW.    Macronutrient needs: (using CBW (Current body weight) bed scale 90 kg)  EER: 1800-2250 kcal/day (20-25 kcal/kg)  EPR: 72-90 g/day (0.8-1.0 g/kg)    Nutrition Diagnosis:  Inadequate oral intake related to altered GI function as evidenced by limited po during admisison secondary NV, currently NPO for ERCP, overall intake meeting <25% estimated needs.      Nutrition Intervention:  Meals and snacks: Progress oral diet as medically appropriate s/p ERCP.  If prolonged need for NPO/CLD > 7 days consider nutrition support for priamry needs with enteral route preferred.    Coordination of nutrition care by a Nutrition Professional: Discussed with Johana, RN.   Discharge Nutrition Plan: Too soon to determine.    Joann Randolph MA, RD, LD  [phone]

## 2019-03-04 NOTE — Other (Signed)
TRANSFER - OUT REPORT:    Verbal report given to Johann Munoz RN  on Vincent Smith  being transferred to 611 for routine post - op       Report given with Kardex

## 2019-03-04 NOTE — Progress Notes (Signed)
Hourly rounds completed, all needs are met. Will continue to monitor and report given to oncoming nurse.

## 2019-03-04 NOTE — Progress Notes (Signed)
TRANSFER - IN REPORT:    Verbal report received from Mary Reynolds(name) on Vincent Smith  being received from PACU(unit) for routine progression of care      Report consisted of patient???s Situation, Background, Assessment and   Recommendations(SBAR).     Information from the following report(s) SBAR was reviewed with the receiving nurse.    Opportunity for questions and clarification was provided.      Assessment completed upon patient???s arrival to unit and care assumed.

## 2019-03-04 NOTE — Progress Notes (Signed)
CM followed up with patient regarding calling [facility] to get services [facility] and to see if he contacted the [place] for the homeless services to get him a place to stay.  Patient stated he has not called at this time.  The phone numbers are still at his bedside next to the phone.  CM explained patient will need to call as they want to speak with him and get their phone survey completed. Patient verbalized understanding and stated he will call when he has time.   CM will continue to follow patient during hospitalization.  Please contact CM for any new needs.

## 2019-03-04 NOTE — Consults (Signed)
[facility]  [address], [address]  [place] [address]  [phone]    H&P/Consult Note/Progress Note:   Vincent Smith  MRN: [id number]  DOB:Dec 31, 1960  Age:58 y.o.    HPI: Vincent Smith is a 58 y.o. male who has PMHx of COPD, seizures, CAD s/p MI with stents (3-4 years ago, not on [facility] who we are asked by GI to see for cholecystectomy.  Pt initially presented to the ED on 03/01/2019 with vague complaints of malaise, fatigue, fever, poor appetite, and chest pain. Pt was found to have lactic acidosis, pancytopenia and splenomegaly, jaundice with elevated LFTs, tbili, and Hepatitis A. He is homeless and follows with the [place].  Abd US demonstrates gallstones and thick wall gallbladder with acute vs chronic cholecystitis. GI was consulted for evaluation and ordered MRCP which pt has not been able to complete due to claustrophobia.  Pt is going for ERCP today. WBD 3, PLTs 140.  tbili 4.2, LFTs trending down.  Lactic 1.8 and trending down. Blood cultures with GPCs, pt on zosyn and vanco. General surgery was consulted for cholecystectomy.     HIV results pending.  Pt denies h/o abdominal surgery.    [place] Abd 03/01/2019:  FINDINGS: Pancreas is grossly unremarkable although the tail is partially  obscured by overlying bowel gas.. Aorta is normal caliber, 2.6 cm. The IVC is  patent. The enlarged spleen has a homogenous echotexture and measures 23 cm.   Left kidney measures 15.1 cm included upper pole cyst calcification. No  hydronephrosis. Right kidney is unremarkable without hydronephrosis and measures  13.6 cm. Renal echogenicity is normal, the right kidney is hypoechoic to the  liver. The intrahepatic biliary tree is not dilated. There is hepatopetal flow  in the portal vein. Small echogenic hemangioma right lobe liver, 9 x 13 mm. The  gallbladder wall is thickened, up to 8 mm. Multiple small echogenic shadowing  gallstones. The common bile duct is not dilated, 6 mm.    ??  IMPRESSION  IMPRESSION: Cholelithiasis with gallbladder wall thickening, possibly acute or  chronic cholecystitis. No biliary tree obstruction.    Past Medical History:   Diagnosis Date   ??? CAD (coronary artery disease)    ??? COPD    ??? Gastrointestinal disorder     acid reflux and ulcers   ??? Hypertension    ??? Seizures ([place])      Past Surgical History:   Procedure Laterality Date   ??? CARDIAC SURG PROCEDURE UNLIST      2 stents     Current Facility-Administered Medications   Medication Dose Route Frequency   ??? diazePAM (VALIUM) tablet 7.5 mg  7.5 mg Oral ONCE PRN   ??? pantoprazole (PROTONIX) tablet 40 mg  40 mg Oral DAILY   ??? acetaminophen (TYLENOL) tablet 650 mg  650 mg Oral Q6H PRN   ??? promethazine (PHENERGAN) with saline injection 12.5 mg  12.5 mg IntraVENous Q6H PRN   ??? sodium chloride (NS) flush 5-40 mL  5-40 mL IntraVENous Q8H   ??? sodium chloride (NS) flush 5-40 mL  5-40 mL IntraVENous PRN   ??? diphenhydrAMINE (BENADRYL) capsule 25 mg  25 mg Oral Q4H PRN   ??? ondansetron (ZOFRAN) injection 4 mg  4 mg IntraVENous Q4H PRN   ??? senna-docusate (PERICOLACE) 8.6-50 mg per tablet 2 Tab  2 Tab Oral DAILY PRN   ??? heparin (porcine) injection 5,000 Units  5,000 Units SubCUTAneous Q8H   ???  albuterol-ipratropium (DUO-NEB) 2.5 MG-0.5 MG/3 ML  3 mL Nebulization Q6H PRN   ??? piperacillin-tazobactam (ZOSYN) 3.375 g in 0.9% sodium chloride (MBP/ADV) 100 mL  3.375 g IntraVENous Q8H   ??? morphine injection 1 mg  1 mg IntraVENous Q4H PRN   ??? influenza vaccine 2019-20 (6 mos+)(PF) (FLUARIX/FLULAVAL/FLUZONE QUAD) injection 0.5 mL  0.5 mL IntraMUSCular PRIOR TO DISCHARGE     Patient has no known allergies.  Social History     Socioeconomic History   ??? Marital status: MARRIED     Spouse name: Not on file   ??? Number of children: Not on file   ??? Years of education: Not on file   ??? Highest education level: Not on file   Tobacco Use   ??? Smoking status: Former Smoker     Packs/day: 2.00   Substance and Sexual Activity   ??? Alcohol use: No   ???  Drug use: Not Currently     Types: Marijuana     Social History     Tobacco Use   Smoking Status Former Smoker   ??? Packs/day: 2.00     Family History   Problem Relation Age of Onset   ??? Hypertension Mother    ??? Hypertension Father    ??? Diabetes Father      ROS: The patient has difficulty with chest pain & shortness of breath.  + fever or chills.  Comprehensive review of systems was otherwise unremarkable except as noted above.    Physical Exam:   Visit Vitals  BP 102/60   Pulse 68   Temp 97.7 ??F (36.5 ??C)   Resp 18   Ht '5\' 10"'  (1.778 m)   Wt 198 lb 4.8 oz (89.9 kg)   SpO2 98%   BMI 28.45 kg/m??     Constitutional: Alert, oriented, cooperative patient in no acute distress; appears stated age. Appears jaundiced.  Eyes:Sclera are icteric. EOMs intact  ENMT: no external lesions gross hearing normal; no obvious neck masses, no ear or lip lesions, nares normal  CV: RRR. Normal perfusion  Resp: No JVD.  Breathing is  non-labored; no audible wheezing.    GI: soft and mildly distended; diffuse tenderness, worse in RUQ.  Palpable splenomegaly. + umbilical hernia. No guarding.   Musculoskeletal: unremarkable with normal function. No embolic signs or cyanosis.   Neuro:  Oriented; moves all 4; no focal deficits  Psychiatric: normal affect and mood, no memory impairment    Recent vitals (if inpt):  Patient Vitals for the past 24 hrs:   BP Temp Pulse Resp SpO2   03/04/19 0823 102/60 97.7 ??F (36.5 ??C) 68 18 98 %   03/04/19 0444 96/54 97.4 ??F (36.3 ??C) 75 18 92 %   03/03/19 2331 98/51 98.8 ??F (37.1 ??C) 71 18 95 %   03/03/19 1943 100/63 99.2 ??F (37.3 ??C) 71 18 96 %   03/03/19 1523 112/72 96.7 ??F (35.9 ??C) 81 20 96 %   03/03/19 1156 95/51 97.6 ??F (36.4 ??C) 65 18 96 %       Labs:  Recent Labs     03/04/19  0451 03/03/19  0526  03/01/19  2123   WBC 3.0* 3.4*   < >  --    HGB 9.4* 9.9*   < >  --    PLT 140* 132*   < >  --    NA 141 142   < >  --    K 4.0 3.8   < >  --  CL 111* 112*   < >  --    CO2 22 22   < >  --    BUN 11 12   < >  --     CREA 0.84 0.81   < >  --    GLU 99 90   < >  --    PTP  --  15.8*  --   --    INR  --  1.2  --   --    APTT  --  41.6*  --   --    TBILI 4.2* 4.4*   < >  --    CBIL 3.7* 3.8*   < >  --    SGOT 417* 491*   < >  --    ALT 425* 572*   < >  --    AP 185* 187*   < >  --    NH4  --   --   --  22   TROIQ  --  <0.02*  --   --     < > = values in this interval not displayed.       Lab Results   Component Value Date/Time    WBC 3.0 (L) 03/04/2019 04:51 AM    HGB 9.4 (L) 03/04/2019 04:51 AM    PLATELET 140 (L) 03/04/2019 04:51 AM    Sodium 141 03/04/2019 04:51 AM    Potassium 4.0 03/04/2019 04:51 AM    Chloride 111 (H) 03/04/2019 04:51 AM    CO2 22 03/04/2019 04:51 AM    BUN 11 03/04/2019 04:51 AM    Creatinine 0.84 03/04/2019 04:51 AM    Glucose 99 03/04/2019 04:51 AM    INR 1.2 03/03/2019 05:26 AM    aPTT 41.6 (H) 03/03/2019 05:26 AM    Bilirubin, total 4.2 (H) 03/04/2019 04:51 AM    Bilirubin, direct 3.7 (H) 03/04/2019 04:51 AM    AST (SGOT) 417 (H) 03/04/2019 04:51 AM    ALT (SGPT) 425 (H) 03/04/2019 04:51 AM    Alk. phosphatase 185 (H) 03/04/2019 04:51 AM    Lipase 94 12/08/2010 12:50 AM    Ammonia 22 03/07/2019 09:23 PM    Troponin-I <0.05 12/08/2010 03:28 AM    Troponin-I, Qt. <0.02 (L) 03/03/2019 05:26 AM       I reviewed recent labs and recent radiologic studies.    I independently reviewed radiology images for studies I described above or studies I have ordered.   Admission date (for inpatients): 03-07-19   * No surgery date entered *  Procedure(s):  ENDOSCOPIC RETROGRADE CHOLANGIOPANCREATOGRAPHY (ERCP)    ASSESSMENT/PLAN:  Problem List  Date Reviewed: 03/07/19          Codes Class Noted    Pancytopenia ([place]) ICD-10-CM: I29.798  ICD-9-CM: 284.19  03/02/2019        Jaundice ICD-10-CM: R17  ICD-9-CM: 782.4  Mar 07, 2019        CAD (coronary artery disease) ICD-10-CM: I25.10  ICD-9-CM: 414.00  03/07/19        COPD ICD-9-CM: 496  March 07, 2019        Normocytic anemia ICD-10-CM: D64.9  ICD-9-CM: 285.9  Mar 07, 2019        Lactic  acidosis ICD-10-CM: E87.2  ICD-9-CM: 276.2  2019/03/07        * (Principal) Elevated liver enzymes ICD-10-CM: R74.8  ICD-9-CM: 790.5  03/07/19            Principal Problem:    Elevated liver enzymes (03-07-2019)  Active Problems:    Jaundice (03/01/2019)      CAD (coronary artery disease) (03/01/2019)      COPD (03/01/2019)      Normocytic anemia (03/01/2019)      Lactic acidosis (03/01/2019)      Pancytopenia ([place]) (03/02/2019)       Plan:  GI plans for ERCP today which showed normal biliary tree and filling of gallbladder, therefore no acute cholecystitis or possible explanation of LFTs from the gallbladder.  Hep A IgM + and likely patient has acute Hepatitis A  Cholelithiasis with chronic cholecystitis would not produce these abnormalities.  Vague admission symptoms c/w viral hepatitis  It is not proper time to perform cholecystectomy, which could be done electively in the future as he recovers from Hep A  I discussed with Dr. Ralph Dowdy  Diet per GI  Pain control  Follow labs    I have personally performed a face-to-face diagnostic evaluation and management  service on this patient.    I have independently seen the patient.   I have independently obtained the above history from the patient/family.    I have independently examined the patient with above findings.  I have independently reviewed data/labs for this patient and developed the above plan of care (MDM).  Signed: Nicoletta Dress, MD, FACS

## 2019-03-04 NOTE — Progress Notes (Signed)
 TRANSFER - IN REPORT:    Verbal report received from [facility]) on Vincent Smith  being received from [facility]) for routine progression of care      Report consisted of patient's Situation, Background, Assessment and   Recommendations(SBAR).     Information from the following report(s) SBAR was reviewed with the receiving nurse.    Opportunity for questions and clarification was provided.      Assessment completed upon patient's arrival to unit and care assumed.

## 2019-03-04 NOTE — Progress Notes (Signed)
 TRANSFER - OUT REPORT:    Verbal report given to Aleena (name) on Vincent Smith  being transferred to GI lab  (unit) for ordered procedure       Report consisted of patient's Situation, Background, Assessment and   Recommendations(SBAR).     Information from the following report(s) SBAR was reviewed with the receiving nurse.    Lines:   Peripheral IV 03/01/19 Left Forearm (Active)   Site Assessment Clean, dry, & intact 03/04/2019  7:57 AM   Phlebitis Assessment 0 03/04/2019  7:57 AM   Infiltration Assessment 0 03/04/2019  7:57 AM   Dressing Status Clean, dry, & intact 03/04/2019  7:57 AM   Dressing Type Transparent 03/04/2019  7:57 AM   Hub Color/Line Status Patent;Infusing 03/04/2019  7:57 AM        Opportunity for questions and clarification was provided.

## 2019-03-04 NOTE — Progress Notes (Signed)
 TRANSFER - IN REPORT:    Verbal report received from Forest, RN on Vincent Smith  being received from [id number]   for ordered procedure      Report consisted of patient's Situation, Background, Assessment and   Recommendations(SBAR).     Information from the following report(s) SBAR, Kardex, Procedure Summary, Recent Results and Procedure Verification was reviewed with the receiving nurse.    Opportunity for questions and clarification was provided.      Assessment completed upon patient's arrival to unit and care assumed.

## 2019-03-04 NOTE — Progress Notes (Signed)
Hourly rounds performed.  All needs meet.  Patient tolerated clear diet well after ERCP.  Voiding: amber, clear.     Bed low/locked.  Call light within reach.  Patient denies needs at this time.  Will continue to monitor and report to oncoming RN

## 2019-03-04 NOTE — Progress Notes (Signed)
Progress Notes by Shon Millet, MD at 03/04/19 0848                Author: Shon Millet, MD  Service: Gastroenterology  Author Type: Physician       Filed: 03/04/19 1601  Date of Service: 03/04/19 0848  Status: Addendum          Editor: Shon Millet, MD (Physician)          Related Notes: Original Note by Shon Millet, MD (Physician) filed at 03/04/19 [phone]               .      [facility] Progress Note                   Date: 03/04/2019      GI Problem: hyperbilirubinemia      History of Present Illness:  Patient is a 58 y.o.  male who is seen in consultation for hyperbilirubinemia.  Has bad generalized abdominal pain.  TB/ DB 4.0.  AST/ALT in 600 range.  US shows  cholelithiasis and GB wall thickening.  Tried MRCP yesterday but patient could not tolerate due to claustrophobia so not done.  HAV IgM positive.      Hospital Medications:     Current Facility-Administered Medications          Medication  Dose  Route  Frequency           ?  diazePAM (VALIUM) tablet 7.5 mg   7.5 mg  Oral  ONCE PRN     ?  pantoprazole (PROTONIX) tablet 40 mg   40 mg  Oral  DAILY     ?  acetaminophen (TYLENOL) tablet 650 mg   650 mg  Oral  Q6H PRN     ?  promethazine (PHENERGAN) with saline injection 12.5 mg   12.5 mg  IntraVENous  Q6H PRN     ?  sodium chloride (NS) flush 5-40 mL   5-40 mL  IntraVENous  Q8H     ?  sodium chloride (NS) flush 5-40 mL   5-40 mL  IntraVENous  PRN     ?  diphenhydrAMINE (BENADRYL) capsule 25 mg   25 mg  Oral  Q4H PRN     ?  ondansetron (ZOFRAN) injection 4 mg   4 mg  IntraVENous  Q4H PRN     ?  senna-docusate (PERICOLACE) 8.6-50 mg per tablet 2 Tab   2 Tab  Oral  DAILY PRN     ?  heparin (porcine) injection 5,000 Units   5,000 Units  SubCUTAneous  Q8H     ?  albuterol-ipratropium (DUO-NEB) 2.5 MG-0.5 MG/3 ML   3 mL  Nebulization  Q6H PRN           ?  piperacillin-tazobactam (ZOSYN) 3.375 g in 0.9% sodium chloride (MBP/ADV) 100 mL   3.375 g  IntraVENous  Q8H            ?  morphine injection 1 mg   1 mg  IntraVENous  Q4H PRN           ?  influenza vaccine 2019-20 (6 mos+)(PF) (FLUARIX/FLULAVAL/FLUZONE QUAD) injection 0.5 mL   0.5 mL  IntraMUSCular  PRIOR TO DISCHARGE             Objective:        Physical Exam:   Vitals:   Visit Vitals      BP  102/60  Pulse  68     Temp  97.7 ??F (36.5 ??C)     Resp  18     Ht  5\' 10"  (1.778 m)     Wt  89.9 kg (198 lb 4.8 oz)     SpO2  98%        BMI  28.45 kg/m??           General: No acute distress.   Skin:  Extremities and face reveal no rashes. No palmer erythema. No telangiectasias on the chest wall.   HEENT: Sclerae anicteric. No oral ulcers.  No abnormal pigmentation of the lips.  The neck is supple.   Cardiovascular: Regular rate and rhythm. No murmurs, gallops, or rubs.   Respiratory:  Comfortable breathing  With no accessory muscle use. Clear breath sounds with no wheezes, rales, or rhonchi.   GI:  Abdomen nondistended, soft, and nontender.  Normal active bowel sounds. No enlargement of the liver or spleen. No masses palpable.   Musculoskeletal:  No pitting edema of the lower legs.  Extremities have good range of motion.    Neurological:  Gross memory appears intact.  Patient is alert and oriented.   Psychiatric:  Mood appears appropriate with judgement intact.   Lymphatic:  No cervical or supraclavicular adenopathy.      Laboratory:       Recent Labs           03/04/19   0451     WBC  3.0*     RBC  3.12*     HGB  9.4*     HCT  29.3*        PLT  140*           Recent Labs           03/04/19   0451     GLU  99     NA  141     K  4.0     CL  111*     CO2  22     BUN  11     CREA  0.84        CA  7.7*          Recent Labs           03/03/19   0526     PTP  15.8*     INR  1.2        APTT  41.6*          Recent Labs           03/04/19   0451     CBIL  3.7*     SGOT  417*     AP  185*     ALB  1.5*        TP  5.7*             Assessment:           A 58 y.o. male with suspected choledocholithiasis,  cholelithiasis, cholecystitis.   Also HAV.           Plan:           ERCP today   Surgery consult         Signed By:  Shon Millet, MD        March 04, 2019

## 2019-03-04 NOTE — Anesthesia Pre-Procedure Evaluation (Signed)
Relevant Problems   No relevant active problems       Anesthetic History               Review of Systems / Medical History  Patient summary reviewed and pertinent labs reviewed    Pulmonary    COPD: moderate               Neuro/Psych     seizures: well controlled         Cardiovascular    Hypertension          Past MI, CAD and cardiac stents (Stents times two)    Exercise tolerance: >4 METS     GI/Hepatic/Renal           Liver disease (Cirrhosis)     Endo/Other  Within defined limits           Other Findings              Physical Exam    Airway  Mallampati: II  TM Distance: 4 - 6 cm  Neck ROM: normal range of motion   Mouth opening: Normal     Cardiovascular  Regular rate and rhythm,  S1 and S2 normal,  no murmur, click, rub, or gallop  Rhythm: regular           Dental         Pulmonary  Breath sounds clear to auscultation               Abdominal         Other Findings            Anesthetic Plan    ASA: 3  Anesthesia type: general          Induction: Intravenous  Anesthetic plan and risks discussed with: Patient

## 2019-03-04 NOTE — Progress Notes (Signed)
 Nutrition Assessment for:   Follow up    Assessment: Admitted with abdominal pain, jaundice, hepatocellular pattern of elevated liver enzymes, cholecystitis due to cholelithiasis.  Unable to complete MRCP.  ERCP planned today.  GI following, surgery consulted.   Pt c/o hiccups at my visit, reports NV from yesterday resolved.  He indicates he hasn't lost that much weight after all. Reports his UBW as 190#.  +mild temporal muscle loss.  He does not provide a quantifiable nutrition history.  He asks for ice chips and then dozes off.    DIET NUTRITIONAL SUPPLEMENTS Breakfast; Ensure Enlive  DIET NPO    Pt has consumed minimal when diet was active.    Anthropometrics:  Height: 5' 10 (177.8 cm), Weight: 89.9 kg (198 lb 4.8 oz), Weight Source: Bed, Body mass index is 28.45 kg/m. BMI class of overweight.  Weight on admission Weight: 86.2 kg (190 lb), Weight Source: Patient stated.  Current measured weight is greater than stated UBW.    Macronutrient needs: (using CBW (Current body weight) bed scale 90 kg)  EER: 1800-2250 kcal/day (20-25 kcal/kg)  EPR: 72-90 g/day (0.8-1.0 g/kg)    Nutrition Diagnosis:  Inadequate oral intake related to altered GI function as evidenced by limited po during admisison secondary NV, currently NPO for ERCP, overall intake meeting <25% estimated needs.      Nutrition Intervention:  Meals and snacks: Progress oral diet as medically appropriate s/p ERCP.  If prolonged need for NPO/CLD > 7 days consider nutrition support for priamry needs with enteral route preferred.    Coordination of nutrition care by a Nutrition Professional: Discussed with Johana, Charity fundraiser.   Discharge Nutrition Plan: Too soon to determine.    [place], [place], [place]  [phone]

## 2019-03-04 NOTE — Interval H&P Note (Signed)
TRANSFER - OUT REPORT:    Verbal report given to Tennis Must RN  on Vincent Smith  being transferred to [id number] for routine post - op       Report given with Kardex

## 2019-03-04 NOTE — Progress Notes (Signed)
Progress  Notes by Helmut Muster, MD at 03/04/19 [phone]                Author: Helmut Muster, MD  Service: Internal Medicine  Author Type: Physician       Filed: 03/04/19 1024  Date of Service: 03/04/19 0744  Status: Addendum          Editor: Helmut Muster, MD (Physician)          Related Notes: Original Note by Helmut Muster, MD (Physician) filed at 03/04/19 1021                         Hospitalist Note        Admit Date:  03/01/2019  8:04 AM    Name:  Vincent Smith    Age:  58 y.o.   DOB:  August 05, 1961    MRN:  [id number]    PCP:  Lacey Jensen, MD   Treatment Team: Attending Provider: Helmut Muster, MD; Utilization Review: Loyce Dys, RN; Consulting Provider: Emogene Morgan, MD; Care Manager: Colan Neptune,  RN; Primary Nurse: Madie Reno, Beverley Fiedler, RN        HPI/Subjective:     Patient is seen and examined at bedside.     Patient was not able to complete MRCP yesterday due to claustrophobia despite given Valium 5 mg.   Reports improvement in his nausea and vomiting overnight.  Complaining of his food last night due to not liking the taste.   Intermittent hiccups overnight which has resolved this morning        Objective:        Patient Vitals for the past 24 hrs:            Temp  Pulse  Resp  BP  SpO2            03/04/19 0444  97.4 ??F (36.3 ??C)  75  18  96/54  92 %            03/03/19 2331  98.8 ??F (37.1 ??C)  71  18  98/51  95 %     03/03/19 1943  99.2 ??F (37.3 ??C)  71  18  100/63  96 %     03/03/19 1523  96.7 ??F (35.9 ??C)  81  20  112/72  96 %     03/03/19 1156  97.6 ??F (36.4 ??C)  65  18  95/51  96 %            03/03/19 0823  98 ??F (36.7 ??C)  75  18  111/70  92 %        Oxygen Therapy   O2 Sat (%): 92 % (03/04/19 0444)   Pulse via Oximetry: 67 beats per minute (03/01/19 1300)   O2 Device: Room air (03/01/19 0811)      Estimated body mass index is 28.45 kg/m?? as calculated from the following:     Height as of 03/02/19: '5\' 10"'  (1.778 m).     Weight as of this encounter: 89.9 kg (198 lb 4.8 oz).          Intake/Output Summary (Last 24 hours) at 03/04/2019 0745   Last data filed at 03/04/2019 0450     Gross per 24 hour        Intake  360 ml        Output  1050 ml        Net  -690  ml          *Note that automatically entered I/Os may not be accurate; dependent on patient compliance with collection and accurate data entry by techs.      Physical Exam:       General:                   alert, awake, jaundice, ill appearing    Head:                        normocephalic, atraumatic   Eyes, Ears, nose:    PERRL, EOMI. Sclera icterus   Neck:                         supple, non-tender    Lungs:                       Crackles on the bases without any wheezing.   Cardiac:                     RRR, Normal S1 and S2.     Abdomen:                  Soft, distended, TTP diffusely without guarding   Extremities:               Warm, dry. No edema    Skin:                           No rashes, no jaundice   Neuro:              Alert and oriented to person, time, place, situation. No gross focal neurological deficit   Psychiatric:                No anxiety, calm, cooperative            Data Review:   I have reviewed all labs, meds, and studies from the last 24 hours:        Recent Results (from the past 24 hour(s))     PLEASE READ & DOCUMENT PPD TEST IN 24 HRS          Collection Time: 03/03/19  8:30 AM         Result  Value  Ref Range            PPD  Negative  Negative       mm Induration  0  0 - 5 mm       EKG, 12 LEAD, SUBSEQUENT          Collection Time: 03/03/19 12:45 PM         Result  Value  Ref Range            Ventricular Rate  67  BPM       Atrial Rate  67  BPM       P-R Interval  158  ms       QRS Duration  114  ms       Q-T Interval  414  ms       QTC Calculation (Bezet)  437  ms       Calculated P Axis  60  degrees       Calculated R Axis  1  degrees       Calculated T Axis  14  degrees       Diagnosis                 Normal sinus rhythm   Incomplete left bundle branch block   Borderline ECG   When compared with ECG of  01-Mar-2019 08:07,   Fusion complexes are no longer Present   Premature ventricular complexes are no longer Present   Premature supraventricular complexes are no longer Present   Confirmed by Tamala Julian  MD ([facility]), CHRISTOPHER H [phone]) on 03/03/2019 4:51:02 PM          METABOLIC PANEL, BASIC          Collection Time: 03/04/19  4:51 AM         Result  Value  Ref Range            Sodium  141  136 - 145 mmol/L       Potassium  4.0  3.5 - 5.1 mmol/L       Chloride  111 (H)  98 - 107 mmol/L       CO2  22  21 - 32 mmol/L       Anion gap  8  7 - 16 mmol/L       Glucose  99  65 - 100 mg/dL       BUN  11  6 - 23 MG/DL       Creatinine  0.84  0.8 - 1.5 MG/DL       GFR est AA  >60  >60 ml/min/1.31m       GFR est non-AA  >60  >60 ml/min/1.722m      Calcium  7.7 (L)  8.3 - 10.4 MG/DL       CBC WITH AUTOMATED DIFF          Collection Time: 03/04/19  4:51 AM         Result  Value  Ref Range            WBC  3.0 (L)  4.3 - 11.1 K/uL       RBC  3.12 (L)  4.23 - 5.6 M/uL       HGB  9.4 (L)  13.6 - 17.2 g/dL       HCT  29.3 (L)  41.1 - 50.3 %       MCV  93.9  79.6 - 97.8 FL       MCH  30.1  26.1 - 32.9 PG       MCHC  32.1  31.4 - 35.0 g/dL       RDW  15.6 (H)  11.9 - 14.6 %       PLATELET  140 (L)  150 - 450 K/uL       MPV  11.7  9.4 - 12.3 FL       ABSOLUTE NRBC  0.00  0.0 - 0.2 K/uL       NEUTROPHILS  50  43 - 78 %       LYMPHOCYTES  37  13 - 44 %       MONOCYTES  10  4.0 - 12.0 %       EOSINOPHILS  2  0.5 - 7.8 %       BASOPHILS  0  0.0 - 2.0 %       IMMATURE GRANULOCYTES  1  0.0 - 5.0 %       ABS. NEUTROPHILS  1.5 (L)  1.7 - 8.2 K/UL  ABS. LYMPHOCYTES  1.1  0.5 - 4.6 K/UL       ABS. MONOCYTES  0.3  0.1 - 1.3 K/UL       ABS. EOSINOPHILS  0.1  0.0 - 0.8 K/UL       ABS. BASOPHILS  0.0  0.0 - 0.2 K/UL       ABS. IMM. GRANS.  0.0  0.0 - 0.5 K/UL       RBC COMMENTS  SLIGHT   ANISOCYTOSIS + POIKILOCYTOSIS             WBC COMMENTS  OCCASIONAL          PLATELET COMMENTS  SLIGHT          DF  AUTOMATED          HEPATIC FUNCTION PANEL           Collection Time: 03/04/19  4:51 AM         Result  Value  Ref Range            Protein, total  5.7 (L)  6.3 - 8.2 g/dL       Albumin  1.5 (L)  3.5 - 5.0 g/dL       Globulin  4.2 (H)  2.3 - 3.5 g/dL       A-G Ratio  0.4 (L)  1.2 - 3.5         Bilirubin, total  4.2 (H)  0.2 - 1.1 MG/DL       Bilirubin, direct  3.7 (H)  <0.4 MG/DL       Alk. phosphatase  185 (H)  50 - 136 U/L       AST (SGOT)  417 (H)  15 - 37 U/L       ALT (SGPT)  425 (H)  12 - 65 U/L       LACTIC ACID          Collection Time: 03/04/19  4:51 AM         Result  Value  Ref Range            Lactic acid  1.8  0.4 - 2.0 MMOL/L              All Micro Results               Procedure  Component  Value  Units  Date/Time           CULTURE, BLOOD [[id number]]  (Abnormal)  Collected:  03/01/19 1335            Order Status:  Completed  Specimen:  Blood  Updated:  03/04/19 0712                Special Requests:  --                  RIGHT   FOREARM                   GRAM STAIN  GRAM POSITIVE COCCI                ANAEROBIC BOTTLE POSITIVE                                     RESULTS VERIFIED, PHONED TO AND READ BACK BY T.LITTLETON,RN @ 0157 03/03/19 MM  Culture result:                 STAPHYLOCOCCUS SPECIES, COAGULASE NEGATIVE                            REFER TO BIOFIRE PANEL [id number]                                THIS ORGANISM MAY BE INDICATIVE OF CULTURE CONTAMINATION, HOWEVER, CLINICAL CORRELATION NEEDS TO BE EVALUATED, AS EACH CASE IS UNIQUE.                     CULTURE, BLOOD [[id number]]  Collected:  03/01/19 1335            Order Status:  Completed  Specimen:  Blood  Updated:  03/03/19 1200                Special Requests:  --                  RIGHT   Antecubital                   Culture result:  NO GROWTH 2 DAYS                BLOOD CULTURE ID PANEL [[id number]]  (Abnormal)  Collected:  03/01/19 1335            Order Status:  Completed  Specimen:  Blood  Updated:  03/03/19 0651              Acc. no. from Micro Order  [id number]                 Staphylococcus  DETECTED                    Comment:  RESULTS VERIFIED, PHONED TO AND READ BACK BY   Royetta Car @ [phone] ON 03/03/2019 AK.                             mecA (Methicillin-Resistance Genes)  NOT DETECTED                    INTERPRETATION                 Gram positive cocci in clusters. Identified by realtime PCR as  Coagulase negative Staphylococci                       Comment:  A single positive culture of coagulase negative Staph is likely to be a contaminant in adult patients. Consider discontinuation of antibiotics  for gram positive bloodstream infections if patient asymptomatic.  THIS TEST DOES NOT REPLACE SENSITIVITY TESTING.                     CULTURE, BLOOD [[id number]]  Collected:  03/03/19 0526            Order Status:  Completed  Specimen:  Blood  Updated:  03/03/19 0625           CULTURE, BLOOD [[id number]]  Collected:  03/03/19 0530            Order Status:  Completed  Specimen:  Blood  Updated:  03/03/19 2585  CULTURE, BLOOD [[id number]]              Order Status:  Canceled  Specimen:  Blood             CULTURE, BLOOD [[id number]]              Order Status:  Canceled  Specimen:  Blood             INFLUENZA A & B AG (RAPID TEST) [[id number]]  Collected:  03/01/19 0918            Order Status:  Completed  Specimen:  Nasopharyngeal from Nasal washing  Updated:  03/01/19 0939                Influenza A Ag  NEGATIVE                   Comment:  NEGATIVE FOR THE PRESENCE OF INFLUENZA A ANTIGEN   INFECTION DUE TO INFLUENZA A CANNOT BE RULED OUT.   BECAUSE THE ANTIGEN PRESENT IN THE SAMPLE MAY BE BELOW   THE DETECTION LIMIT OF THE TEST.   A NEGATIVE TEST IS PRESUMPTIVE AND IT IS RECOMMENDED THAT THESE RESULTS BE CONFIRMED BY VIRAL CULTURE OR AN FDA-CLEARED INFLUENZA A AND B MOLECULAR ASSAY.                             Influenza B Ag  NEGATIVE                   Comment:  NEGATIVE FOR THE PRESENCE OF INFLUENZA B ANTIGEN   INFECTION DUE TO INFLUENZA B CANNOT BE RULED OUT.   BECAUSE THE ANTIGEN  PRESENT IN THE SAMPLE MAY BE BELOW   THE DETECTION LIMIT OF THE TEST.   A NEGATIVE TEST IS PRESUMPTIVE AND IT IS RECOMMENDED THAT THESE RESULTS BE CONFIRMED BY VIRAL CULTURE OR AN FDA-CLEARED INFLUENZA A AND B MOLECULAR ASSAY.                             Source  Nasopharyngeal                     Current Meds:     Current Facility-Administered Medications          Medication  Dose  Route  Frequency           ?  diazePAM (VALIUM) tablet 7.5 mg   7.5 mg  Oral  ONCE PRN     ?  pantoprazole (PROTONIX) tablet 40 mg   40 mg  Oral  DAILY     ?  acetaminophen (TYLENOL) tablet 650 mg   650 mg  Oral  Q6H PRN     ?  promethazine (PHENERGAN) with saline injection 12.5 mg   12.5 mg  IntraVENous  Q6H PRN     ?  sodium chloride (NS) flush 5-40 mL   5-40 mL  IntraVENous  Q8H     ?  sodium chloride (NS) flush 5-40 mL   5-40 mL  IntraVENous  PRN     ?  diphenhydrAMINE (BENADRYL) capsule 25 mg   25 mg  Oral  Q4H PRN     ?  ondansetron (ZOFRAN) injection 4 mg   4 mg  IntraVENous  Q4H PRN     ?  senna-docusate (PERICOLACE) 8.6-50 mg per tablet 2 Tab   2  Tab  Oral  DAILY PRN     ?  heparin (porcine) injection 5,000 Units   5,000 Units  SubCUTAneous  Q8H     ?  albuterol-ipratropium (DUO-NEB) 2.5 MG-0.5 MG/3 ML   3 mL  Nebulization  Q6H PRN     ?  piperacillin-tazobactam (ZOSYN) 3.375 g in 0.9% sodium chloride (MBP/ADV) 100 mL   3.375 g  IntraVENous  Q8H     ?  morphine injection 1 mg   1 mg  IntraVENous  Q4H PRN           ?  influenza vaccine 2019-20 (6 mos+)(PF) (FLUARIX/FLULAVAL/FLUZONE QUAD) injection 0.5 mL   0.5 mL  IntraMUSCular  PRIOR TO DISCHARGE           Other Studies:      [place] Abd Comp      Result Date: 03/01/2019   ABDOMINAL ULTRASOUND. HISTORY: Elevated liver enzymes, jaundice and abdominal pain. COMPARISON: None FINDINGS: Pancreas is grossly unremarkable although the tail is partially obscured by overlying bowel gas.. Aorta is normal caliber, 2.6 cm. The IVC is  patent. The enlarged spleen has a homogenous echotexture and  measures 23 cm. Left kidney measures 15.1 cm included upper pole cyst calcification. No hydronephrosis. Right kidney is unremarkable without hydronephrosis and measures 13.6 cm. Renal echogenicity  is normal, the right kidney is hypoechoic to the liver. The intrahepatic biliary tree is not dilated. There is hepatopetal flow in the portal vein. Small echogenic hemangioma right lobe liver, 9 x 13 mm. The gallbladder wall is thickened, up to 8 mm.  Multiple small echogenic shadowing gallstones. The common bile duct is not dilated, 6 mm.       IMPRESSION: Cholelithiasis with gallbladder wall thickening, possibly acute or chronic cholecystitis. No biliary tree obstruction.       Xr Chest Port      Result Date: 03/01/2019   1 View portable chest x-ray 03/01/2019 8:32 AM Indication: Chest pain Comparison: 12/08/2010 Findings: This portable upright AP chest of 0809 shows the monitoring leads overlying the chest. Lung volumes are low, only slight crowding though suggested at  the bases. Cardiomediastinal silhouette within normal limits. No confluent infiltrate, no significant effusion. Vascularity seems appropriate.       IMPRESSION: Mild pulmonary hypoinflation. Otherwise no acute infiltrate suggested.                Assessment and Plan:           Hospital Problems  as of 03/04/2019  Date Reviewed:  03/01/2019                         Codes  Class  Noted - Resolved  POA              Pancytopenia ([place])  ICD-10-CM: C12.751   ICD-9-CM: 284.19    03/02/2019 - Present  Unknown                        Jaundice  ICD-10-CM: R17   ICD-9-CM: 782.4    03/01/2019 - Present  Unknown                        CAD (coronary artery disease)  ICD-10-CM: I25.10   ICD-9-CM: 414.00    03/01/2019 - Present  Unknown  COPD  ICD-9-CM: 109    03/01/2019 - Present  Unknown                        Normocytic anemia  ICD-10-CM: D64.9   ICD-9-CM: 285.9    03/01/2019 - Present  Unknown                        Lactic acidosis  ICD-10-CM: E87.2   ICD-9-CM:  276.2    03/01/2019 - Present  Unknown                        * (Principal) Elevated liver enzymes  ICD-10-CM: R74.8   ICD-9-CM: 790.5    03/01/2019 - Present  Unknown                          Plan:      Vincent Smith is a 58 year old male with past medical history of MI (3-59yr ago) s/p 2 stents, COPD who presented to ED with many vague complaints.    ??   Abdominal Pain, Jaundice, Hepatocellular pattern of elevated Liver Enzymes    Cholecystitis due to cholelithiasis   Hap A infection (IgM positive)   - ultrasound Abd : Cholelithiasis within the gallbladder with wall thickening.  No biliary tree obstruction.   - HIV pending   - zosyn (3/7)   - GI consult : ERCP today.   - Surgery Consult   - will defer to GI for Hap A management: vaccine +/- Ig         Lactic acidosis: resolved   - BCx: one bottle positive for GPC coag neg likely comtaminant. 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 Along with splenomegaly, elevated LDH likely be causing destruction in the enlarged spleen.   - stool occult neg.   - peripheral smear   ??   MI s/p stents (3-456yrago)   - not on any meds   ??   COPD   - not on any meds   - nebs prn   - o2 supplement as needed      Headache S/p Fall (prior to admission)   - did not hit his head or LOC per patient   - CT Head w/o contrast : neg for acute abnormalities      Diet:  DIET NUTRITIONAL SUPPLEMENTS   DIET NPO   DVT PPx: heparin sq   Code: Full Code         Medical Decision Making:      Labs/Imaging reviewed.   Additional information obtained from nursing staff.   Patient is high risk due to medical condition and comorbidities.   Plan discussed with nursing staff.   Plan discussed with patient/family.  All questions/concerns were addressed. Pt/family agrees with the plan.                Signed By:  BaHelmut MusterMD           March 04, 2019

## 2019-03-04 NOTE — Procedures (Signed)
ERCP Procedure Note    Indications: Elevated LFT's, abdominal pain, cholelithiasis    Anesthesia/Sedation: MAC IV     Pre-Procedure Physical:    Current Facility-Administered Medications   Medication Dose Route Frequency   ??? lactated Ringers infusion  75 mL/hr IntraVENous ENDO CONTINUOUS   ??? glucagon (GLUCAGEN) injection 0.5-1 mg  0.5-1 mg IntraVENous Multiple   ??? indomethacin (INDOCIN) rectal suppository 100 mg  100 mg Rectal PRN   ??? iopamidoL (ISOVUE-370) 76 % injection 50 mL  50 mL Other ONCE   ??? pantoprazole (PROTONIX) tablet 40 mg  40 mg Oral DAILY   ??? acetaminophen (TYLENOL) tablet 650 mg  650 mg Oral Q6H PRN   ??? promethazine (PHENERGAN) with saline injection 12.5 mg  12.5 mg IntraVENous Q6H PRN   ??? sodium chloride (NS) flush 5-40 mL  5-40 mL IntraVENous Q8H   ??? sodium chloride (NS) flush 5-40 mL  5-40 mL IntraVENous PRN   ??? diphenhydrAMINE (BENADRYL) capsule 25 mg  25 mg Oral Q4H PRN   ??? ondansetron (ZOFRAN) injection 4 mg  4 mg IntraVENous Q4H PRN   ??? senna-docusate (PERICOLACE) 8.6-50 mg per tablet 2 Tab  2 Tab Oral DAILY PRN   ??? heparin (porcine) injection 5,000 Units  5,000 Units SubCUTAneous Q8H   ??? albuterol-ipratropium (DUO-NEB) 2.5 MG-0.5 MG/3 ML  3 mL Nebulization Q6H PRN   ??? piperacillin-tazobactam (ZOSYN) 3.375 g in 0.9% sodium chloride (MBP/ADV) 100 mL  3.375 g IntraVENous Q8H   ??? morphine injection 1 mg  1 mg IntraVENous Q4H PRN   ??? influenza vaccine 2019-20 (6 mos+)(PF) (FLUARIX/FLULAVAL/FLUZONE QUAD) injection 0.5 mL  0.5 mL IntraMUSCular PRIOR TO DISCHARGE     Facility-Administered Medications Ordered in Other Encounters   Medication Dose Route Frequency   ??? lidocaine (PF) (XYLOCAINE) 20 mg/mL (2 %) injection   IntraVENous PRN   ??? propofoL (DIPRIVAN) 10 mg/mL injection   IntraVENous PRN   ??? rocuronium injection    PRN   ??? succinylcholine (ANECTINE) injection    PRN      Tramadol    Patient Vitals for the past 8 hrs:   BP Temp Pulse Resp SpO2   03/04/19 1402 105/69 ??? 70 16 100 %   03/04/19 1158  104/64 98.2 ??F (36.8 ??C) 66 16 95 %   03/04/19 0823 102/60 97.7 ??F (36.5 ??C) 68 18 98 %       Exam      Airway: clear   Heart: normal S1and S2    Lungs: clear bilateral  Abdomen: soft, nontender, bowel sounds present and normal in all quads   Mental Status: awake, alert and oriented to person, place and time          Procedure Details     Informed consent was obtained for the procedure, including conscious sedation. Risks of pancreatitis, infection, perforation, hemorrhage, adverse drug reaction and aspiration were discussed. The patient was placed in the prone position.  Based on the pre-procedure assessment, including review of the patient's medical history, medications, allergies, and review of systems, he had been deemed to be an appropriate candidate for conscious sedation; he was therefore sedated with the medications listed below.  He was monitored continuously with ECG tracing, pulse oximetry, blood pressure monitoring, and direct observation.      The side viewing duodenoscope was inserted through the mouth and advanced to the major duodenal papilla.  The common bile duct was selectively cannulated with the 44 sphinctertome over a 125 jag wire.  Cholangiogram taken.  Sphincterotomy was performed.  Balloon sweep of the bile duct was performed with 9-12 mm balloon.       Findings:   Esophagus- Normal.  Stomach- Normal.  Duodenum- Normal.    Major duodenal papilla- Normal.  Common bile duct- Normal.  Narrow/ not dilated.  Intrahepatic ducts- Normal.  Pancreatic duct- Not entered.  Gallbladder- Filled.    Therapies: Sphincterotomy    Specimens: None    Estimated Blood Loss: 0 cc           Complications:   None; patient tolerated the procedure well.           Attending Attestation:  I performed the procedure.     Impression:    Normal ERCP.  Sphincterotomy performed.  No bile duct stone found.    Recommendations:  Continue supportive care for HAV

## 2019-03-05 ENCOUNTER — Inpatient Hospital Stay
Admit: 2019-03-05 | Discharge: 2019-03-06 | Disposition: A | Discharge status: Home / Self Care | Attending: Emergency Medicine

## 2019-03-05 DIAGNOSIS — R5383 Other fatigue: Secondary | ICD-10-CM

## 2019-03-05 LAB — HEPATIC FUNCTION PANEL
A-G Ratio: 0.4 — ABNORMAL LOW (ref 1.2–3.5)
ALT (SGPT): 412 U/L — ABNORMAL HIGH (ref 12–65)
ALT: 412 U/L — ABNORMAL HIGH (ref 12–65)
AST (SGOT): 376 U/L — ABNORMAL HIGH (ref 15–37)
AST: 376 U/L — ABNORMAL HIGH (ref 15–37)
Albumin/Globulin Ratio: 0.4 — ABNORMAL LOW (ref 1.2–3.5)
Albumin: 1.7 g/dL — ABNORMAL LOW (ref 3.5–5.0)
Albumin: 1.7 g/dL — ABNORMAL LOW (ref 3.5–5.0)
Alk. phosphatase: 201 U/L — ABNORMAL HIGH (ref 50–136)
Alkaline Phosphatase: 201 U/L — ABNORMAL HIGH (ref 50–136)
Bilirubin, Direct: 4.1 MG/DL — ABNORMAL HIGH (ref <0.4)
Bilirubin, direct: 4.1 MG/DL — ABNORMAL HIGH (ref <0.4)
Bilirubin, total: 4.6 MG/DL — ABNORMAL HIGH (ref 0.2–1.1)
Globulin: 4.2 g/dL — ABNORMAL HIGH (ref 2.3–3.5)
Globulin: 4.2 g/dL — ABNORMAL HIGH (ref 2.3–3.5)
Protein, total: 5.9 g/dL — ABNORMAL LOW (ref 6.3–8.2)
Total Bilirubin: 4.6 MG/DL — ABNORMAL HIGH (ref 0.2–1.1)
Total Protein: 5.9 g/dL — ABNORMAL LOW (ref 6.3–8.2)

## 2019-03-05 LAB — CBC WITH AUTO DIFFERENTIAL
Basophils %: 0 % (ref 0.0–2.0)
Basophils Absolute: 0 10*3/uL (ref 0.0–0.2)
Eosinophils %: 0 % — ABNORMAL LOW (ref 0.5–7.8)
Eosinophils Absolute: 0 10*3/uL (ref 0.0–0.8)
Granulocyte Absolute Count: 0 10*3/uL (ref 0.0–0.5)
Hematocrit: 33.6 % — ABNORMAL LOW (ref 41.1–50.3)
Hemoglobin: 10.5 g/dL — ABNORMAL LOW (ref 13.6–17.2)
Immature Granulocytes: 1 % (ref 0.0–5.0)
Lymphocytes %: 22 % (ref 13–44)
Lymphocytes Absolute: 0.7 10*3/uL (ref 0.5–4.6)
MCH: 30.5 PG (ref 26.1–32.9)
MCHC: 31.3 g/dL — ABNORMAL LOW (ref 31.4–35.0)
MCV: 97.7 FL (ref 79.6–97.8)
MPV: 11.8 FL (ref 9.4–12.3)
Monocytes %: 7 % (ref 4.0–12.0)
Monocytes Absolute: 0.2 10*3/uL (ref 0.1–1.3)
NRBC Absolute: 0 10*3/uL (ref 0.0–0.2)
Neutrophils %: 70 % (ref 43–78)
Neutrophils Absolute: 2.1 10*3/uL (ref 1.7–8.2)
Platelets: 149 10*3/uL — ABNORMAL LOW (ref 150–450)
RBC: 3.44 M/uL — ABNORMAL LOW (ref 4.23–5.6)
RDW: 15.9 % — ABNORMAL HIGH (ref 11.9–14.6)
WBC: 3 10*3/uL — ABNORMAL LOW (ref 4.3–11.1)

## 2019-03-05 LAB — BASIC METABOLIC PANEL
Anion Gap: 8 mmol/L (ref 7–16)
BUN: 16 MG/DL (ref 6–23)
CO2: 21 mmol/L (ref 21–32)
Calcium: 7.9 MG/DL — ABNORMAL LOW (ref 8.3–10.4)
Chloride: 112 mmol/L — ABNORMAL HIGH (ref 98–107)
Creatinine: 0.9 MG/DL (ref 0.8–1.5)
EGFR IF NonAfrican American: >60 mL/min/{1.73_m2} (ref >60)
GFR African American: >60 mL/min/{1.73_m2} (ref >60)
Glucose: 116 mg/dL — ABNORMAL HIGH (ref 65–100)
Potassium: 5.1 mmol/L (ref 3.5–5.1)
Sodium: 141 mmol/L (ref 136–145)

## 2019-03-05 LAB — POCT GLUCOSE: POC Glucose: 251 mg/dL — ABNORMAL HIGH (ref 65–100)

## 2019-03-05 LAB — CBC WITH AUTOMATED DIFF
ABS. BASOPHILS: 0 10*3/uL (ref 0.0–0.2)
ABS. EOSINOPHILS: 0 10*3/uL (ref 0.0–0.8)
ABS. IMM. GRANS.: 0 10*3/uL (ref 0.0–0.5)
ABS. LYMPHOCYTES: 0.7 10*3/uL (ref 0.5–4.6)
ABS. MONOCYTES: 0.2 10*3/uL (ref 0.1–1.3)
ABS. NEUTROPHILS: 2.1 10*3/uL (ref 1.7–8.2)
ABSOLUTE NRBC: 0 10*3/uL (ref 0.0–0.2)
BASOPHILS: 0 % (ref 0.0–2.0)
EOSINOPHILS: 0 % — ABNORMAL LOW (ref 0.5–7.8)
HCT: 33.6 % — ABNORMAL LOW (ref 41.1–50.3)
HGB: 10.5 g/dL — ABNORMAL LOW (ref 13.6–17.2)
IMMATURE GRANULOCYTES: 1 % (ref 0.0–5.0)
LYMPHOCYTES: 22 % (ref 13–44)
MCH: 30.5 PG (ref 26.1–32.9)
MCHC: 31.3 g/dL — ABNORMAL LOW (ref 31.4–35.0)
MCV: 97.7 FL (ref 79.6–97.8)
MONOCYTES: 7 % (ref 4.0–12.0)
MPV: 11.8 FL (ref 9.4–12.3)
NEUTROPHILS: 70 % (ref 43–78)
PLATELET: 149 10*3/uL — ABNORMAL LOW (ref 150–450)
RBC: 3.44 M/uL — ABNORMAL LOW (ref 4.23–5.6)
RDW: 15.9 % — ABNORMAL HIGH (ref 11.9–14.6)
WBC: 3 10*3/uL — ABNORMAL LOW (ref 4.3–11.1)

## 2019-03-05 LAB — METABOLIC PANEL, BASIC
Anion gap: 8 mmol/L (ref 7–16)
BUN: 16 MG/DL (ref 6–23)
CO2: 21 mmol/L (ref 21–32)
Calcium: 7.9 MG/DL — ABNORMAL LOW (ref 8.3–10.4)
Chloride: 112 mmol/L — ABNORMAL HIGH (ref 98–107)
Creatinine: 0.9 MG/DL (ref 0.8–1.5)
GFR est AA: >60 mL/min/{1.73_m2} (ref >60)
GFR est non-AA: >60 mL/min/{1.73_m2} (ref >60)
Glucose: 116 mg/dL — ABNORMAL HIGH (ref 65–100)
Potassium: 5.1 mmol/L (ref 3.5–5.1)
Sodium: 141 mmol/L (ref 136–145)

## 2019-03-05 LAB — PLEASE READ & DOCUMENT PPD TEST IN 72 HRS
PPD: NEGATIVE Negative
mm Induration: 0 mm (ref 0–5)

## 2019-03-05 LAB — GLUCOSE, POC: Glucose (POC): 251 mg/dL — ABNORMAL HIGH (ref 65–100)

## 2019-03-05 MED ORDER — ONDANSETRON 4 MG TAB, RAPID DISSOLVE
4 mg | ORAL_TABLET | Freq: Three times a day (TID) | ORAL | 1 refills | Status: AC | PRN
Start: 2019-03-05 — End: 2019-03-09

## 2019-03-05 MED FILL — HEPARIN (PORCINE) 5,000 UNIT/ML IJ SOLN: 5000 unit/mL | INTRAMUSCULAR | Qty: 1

## 2019-03-05 MED FILL — MORPHINE 2 MG/ML INJECTION: 2 mg/mL | INTRAMUSCULAR | Qty: 1

## 2019-03-05 MED FILL — PIPERACILLIN-TAZOBACTAM 3.375 GRAM IV SOLR: 3.375 gram | INTRAVENOUS | Qty: 3.38

## 2019-03-05 MED FILL — PANTOPRAZOLE 40 MG TAB, DELAYED RELEASE: 40 mg | ORAL | Qty: 1

## 2019-03-05 NOTE — Progress Notes (Signed)
Attempted to call operator to assist pt to call [facility] with 803 area code. Was on hold several minutes and had to move on . Will attempt again later as well. Pt is asking to see CM and will let them know.

## 2019-03-05 NOTE — Discharge Summary (Signed)
Discharge Summary     Patient: Vincent Smith MRN: [id number]  SSN: [id number]    Date of Birth: 1961-08-27  Age: 58 y.o.  Sex: male       Admit Date: March 10, 2019    Discharge Date: 03/05/2019      Admission Diagnoses: Elevated liver enzymes [R74.8]  Jaundice [R17]    Discharge Diagnoses:   Problem List as of 03/05/2019 Date Reviewed: 2019/03/10          Codes Class Noted - Resolved    * (Principal) Acute hepatitis A ICD-10-CM: B15.9  ICD-9-CM: 070.1  03/05/2019 - Present        Elevated liver enzymes ICD-10-CM: R74.8  ICD-9-CM: 790.5  03-10-19 - Present        Hyperbilirubinemia ICD-10-CM: E80.6  ICD-9-CM: 782.4  03/05/2019 - Present        Pancytopenia ([place]) ICD-10-CM: G25.427  ICD-9-CM: 284.19  03/02/2019 - Present        Jaundice ICD-10-CM: R17  ICD-9-CM: 782.4  03/10/19 - Present        CAD (coronary artery disease) ICD-10-CM: I25.10  ICD-9-CM: 414.00  03/10/2019 - Present        COPD ICD-9-CM: 062  03/10/19 - Present        Normocytic anemia ICD-10-CM: D64.9  ICD-9-CM: 285.9  03-10-2019 - Present        Lactic acidosis ICD-10-CM: E87.2  ICD-9-CM: 276.2  03-10-2019 - Present              Discharge Condition: Stable    Hospital Course:   Mr. 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  Patient mostly complaining of diffuse abdominal pain and nausea after eating.  Found to have transaminitis in the 400-600 range with minimally elevated alk phos.  Urine drug screen positive for amphetamines.  Gastroenterology was consulted out of concern for choledocholithiasis.  Patient was not able to complete MRCP yesterday due to claustrophobia despite given diazepam 5 mg.  Right upper quadrant ultrasound concerning for cholecystitis therefore general surgery was consulted but feel that this is likely indicative of chronic cholecystitis after patient had an unremarkable ERCP with normal gallbladder filling per  gastroenterology.  Patient's hepatitis A virus IgM is positive.  Most likely etiology for the patient's complaints is acute HAV.  HIV pending at writing this note.  Acute hepatitis C and hepatitis B panels were both negative.  Patient was initially treated with piperacillin/tazobactam for prophylaxis against acute cholecystitis but this was discontinued after normal ERCP.  Patient was able to tolerate a diet without difficulty.  The patient [place] however [place].  Patient discharged in stable medical condition and counseled to [facility].  Patient is not on any medications and given his history of coronary artery disease will likely need follow-up with [facility] at [place].  Patient is also a candidate for elective outpatient cholecystectomy and it is [facility] that [facility].  Patient was not happy that he was being discharged, but did agree that he is medically stable to leave the hospital and that none of our current therapies are exclusive to the inpatient setting.  Patient was given strict return precautions and voiced agreement and understanding.    Physical Exam:   General:  Middle-aged white male, lying in bed, no acute distress  HEENT: NCAT, moist mucous membranes  Skin: No rash noted  Cardio: RRR, normal S1/S2, no rubs, no gallops, no murmurs  Pulm: Non labored respirations on room air, LCAB, no wheezing, no rales, no rhonchi  GI: Soft, Nt, Nd, Nml bowel sounds, no masses noted  Extremity: Atraumatic, no deformities, no edema  Neuro: Alert, oriented, moving all extremities, no focal deficits noted  Psych: Pleasant, cooperative, normal range of affect    Consults: Gastroenterology and General Surgery    Significant Diagnostic Studies:    ABDOMINAL ULTRASOUND.  ??   HISTORY: Elevated liver enzymes, jaundice and abdominal pain.  ??  COMPARISON: None  ??  FINDINGS: Pancreas is grossly unremarkable although the tail is partially  obscured by overlying bowel gas.. Aorta is normal caliber, 2.6 cm. The IVC is  patent. The enlarged spleen has a homogenous echotexture and measures 23 cm.   Left kidney measures 15.1 cm included upper pole cyst calcification. No  hydronephrosis. Right kidney is unremarkable without hydronephrosis and measures  13.6 cm. Renal echogenicity is normal, the right kidney is hypoechoic to the  liver. The intrahepatic biliary tree is not dilated. There is hepatopetal flow  in the portal vein. Small echogenic hemangioma right lobe liver, 9 x 13 mm. The  gallbladder wall is thickened, up to 8 mm. Multiple small echogenic shadowing  gallstones. The common bile duct is not dilated, 6 mm.   ??  IMPRESSION  IMPRESSION: Cholelithiasis with gallbladder wall thickening, possibly acute or  chronic cholecystitis. No biliary tree obstruction.    CT HEAD WITHOUT CONTRAST.  ??  INDICATION: Headache and fall.  ??  COMPARISON: None.    ??  TECHNIQUE:   5 mm axial scans from the skull base to the vertex.  Our CT  scanners use one or more of the following:  Automated exposure control,  adjustment of the mA and or kV according to patient size, iterative  reconstruction.  ??  FINDINGS:  No acute intraparenchymal hemorrhage or abnormal extra-axial fluid  collection.  The ventricles are normal size.  No midline shift or mass effect.   Included portion of the paranasal sinuses and orbits grossly unremarkable.  ??  IMPRESSION  IMPRESSION:  Negative for acute intracranial abnormality.     ERCP images  ??  History: OR 1, 58 years Male ENDO RM 1 DR [facility] ERCP SEE EXAM REPORT FOR MORE  INFO  FLUOR TIME 2 MIN 16 SEC, 5 images saved  ??  Comparison: Abdominal ultrasound March 01, 2019  ??  Findings:  ERCP was performed by gastroenterology service.  Images are   interpreted here.  Please see dedicated report by GI service for complete  details.  ERCP images demonstrate normal-appearing intrahepatic and extrahepatic  biliary ducts, without evidence of filling defect to suggest  choledocholithiasis.  Balloon papillotomy was performed.  ??  IMPRESSION  Impression: ERCP images as above.  ??  Fluoroscopy time: 2:16 min.  ??  Total number of fluoro images obtained: 5    Disposition: Self-care    Discharge Medications:   Current Discharge Medication List      STOP taking these medications       clopidogrel (PLAVIX) 75 mg tablet Comments:   Reason for Stopping:         nitroglycerin (NITROSTAT) 0.4 mg SL tablet Comments:   Reason for Stopping:         IPRATROPIUM/ALBUTEROL SULFATE (COMBIVENT IN) Comments:   Reason  for Stopping:         HYDROcodone-acetaminophen (NORCO) 10-325 mg tablet Comments:   Reason for Stopping:         OTHER Comments:   Reason for Stopping:         OTHER Comments:   Reason for Stopping:               Activity: Activity as tolerated  Diet: Cardiac Diet  Wound Care: None needed    Follow-up Appointments   Procedures   ??? FOLLOW UP VISIT Appointment in: Other (Specify) Establish care with PCP for chronic medical problems. Needs to establish with cardiology for coronary artery disease. See PCP in 1 week.     Establish care with PCP for chronic medical problems. Needs to establish with cardiology for coronary artery disease. See PCP in 1 week.     Standing Status:   Standing     Number of Occurrences:   1     Order Specific Question:   Appointment in     Answer:   Other (Specify)       Signed By: Otilio Miu, MD     March 05, 2019

## 2019-03-05 NOTE — Progress Notes (Signed)
CM made several attempts to find patient family.  Patient does not have any phone numbers for friends or family as he stated he lost his phone.  CM googled patient family names and could not find any phone numbers for them.  [facility] who told them the vouchers are on a waiting list, patient then hung up on them.  [facility] called CM and stated they would try to call him again to see if he is in a better mood.  Patient told CM the only way he is able to get into touch with family is through Facebook messenger.  CM tried to find a hospital computer but all the computers have Facebook blocked.   Patient made aware.

## 2019-03-05 NOTE — Progress Notes (Addendum)
MD Barfell was notified of pt reporting he was told that "that GI doctor told me I wouldn't be sent out in the streets with no place to go' MD Barfell has confirmed with GI provider that the patient can be discharged. Charge RN, Consandra was contacted and she contacted house supervisor and security. Charge RN was very patient, and helpful.    This is unfortunate and upon reflection and review of day's events, it it not unreasonable to report that the patient appeared to have contradictory behaviors and was also untruthful with writer repeatedly throughout the day, and with CW and witnessed by writer.     The patient's behavior may be a conscious choice, or not. However,the pattern of behavior proves challenging and may be perceived as a significant barrier.    Best efforts to be of good help were afforded. And afforded again. And again.     Being of good help and in pursuit of the the mission, vision and values is paramount.  The staff have given their best efforts with available resources for the patient.      He refused to sign discharge papers. PIV was discontinued with catheter intact.     Taken to main exit via WC.

## 2019-03-05 NOTE — ED Notes (Signed)
Patient recently discharged from hospital today. States that he is having the same problems as before. Feels weak, sob and hurts when he coughs. States that his "belly is bloated." was recently diagnosed with hepatitis. States that he is unable to care for himself

## 2019-03-05 NOTE — Progress Notes (Signed)
Assisted pt with phone and outside line to call his sister.

## 2019-03-05 NOTE — Progress Notes (Signed)
Patient has discharge order.  Orders are for patient to follow up with PCP in one week.  Patient has been provided with the number to the [facility] for patient to get scheduled with a PCP as the [facility] will not allow CM to do this.  At this time patient still has not called to get this set up.  Physician also wants patient to get established with a cardiologist.  Physician informed patient will need a referral to the cardiologist.

## 2019-03-05 NOTE — ED Provider Notes (Signed)
Patient is a 58 year old male who presents with abdominal pain in his epigastric region, fatigue, weakness, difficulty standing to walk he states because of his severe fatigue and dehydration states nausea and vomiting since discharge 1 hour prior to arrival in the ER.  Full history can be obtained by reviewing the discharge note from earlier today however in brief he was seen by GI for elevated LFTs and had a ERCP/MRCP which did not reveal any acute etiology is hepatitis panel was negative and work-up otherwise unrevealing of his acute transaminitis.             Past Medical History:   Diagnosis Date   ??? CAD (coronary artery disease)    ??? COPD    ??? Gastrointestinal disorder     acid reflux and ulcers   ??? Hypertension    ??? Seizures ([place])        Past Surgical History:   Procedure Laterality Date   ??? CARDIAC SURG PROCEDURE UNLIST      2 stents         Family History:   Problem Relation Age of Onset   ??? Hypertension Mother    ??? Hypertension Father    ??? Diabetes Father        Social History     Socioeconomic History   ??? Marital status: MARRIED     Spouse name: Not on file   ??? Number of children: Not on file   ??? Years of education: Not on file   ??? Highest education level: Not on file   Occupational History   ??? Not on file   Social Needs   ??? Financial resource strain: Not on file   ??? Food insecurity     Worry: Not on file     Inability: Not on file   ??? Transportation needs     Medical: Not on file     Non-medical: Not on file   Tobacco Use   ??? Smoking status: Former Smoker     Packs/day: 2.00   ??? Smokeless tobacco: Former Systems developer     Quit date: 03/03/2013   Substance and Sexual Activity   ??? Alcohol use: No   ??? Drug use: Not Currently     Types: Marijuana   ??? Sexual activity: Not on file   Lifestyle   ??? Physical activity     Days per week: Not on file     Minutes per session: Not on file   ??? Stress: Not on file   Relationships   ??? Social Product manager on phone: Not on file     Gets together: Not on file      Attends religious service: Not on file     Active member of club or organization: Not on file     Attends meetings of clubs or organizations: Not on file     Relationship status: Not on file   ??? Intimate partner violence     Fear of current or ex partner: Not on file     Emotionally abused: Not on file     Physically abused: Not on file     Forced sexual activity: Not on file   Other Topics Concern   ??? Not on file   Social History Narrative   ??? Not on file         ALLERGIES: Tramadol    Review of Systems   Constitutional: Positive for fatigue. Negative for chills and fever.  HENT: Negative for rhinorrhea and sore throat.    Eyes: Negative for visual disturbance.   Respiratory: Negative for cough and shortness of breath.    Cardiovascular: Negative for chest pain and leg swelling.   Gastrointestinal: Positive for abdominal pain, diarrhea, nausea and vomiting.   Genitourinary: Negative for dysuria.   Musculoskeletal: Negative for back pain and neck pain.   Skin: Negative for rash.   Neurological: Negative for weakness and headaches.   Psychiatric/Behavioral: The patient is not nervous/anxious.        Vitals:    03/05/19 1957   BP: 106/74   Pulse: 79   Resp: 18   Temp: 98.1 ??F (36.7 ??C)   SpO2: 96%            Physical Exam  Vitals signs and nursing note reviewed.   Constitutional:       Appearance: He is well-developed.   HENT:      Head: Normocephalic.      Right Ear: External ear normal.      Left Ear: External ear normal.   Eyes:      Conjunctiva/sclera: Conjunctivae normal.      Pupils: Pupils are equal, round, and reactive to light.   Neck:      Musculoskeletal: Normal range of motion and neck supple.      Trachea: No tracheal deviation.   Cardiovascular:      Rate and Rhythm: Normal rate and regular rhythm.      Heart sounds: Normal heart sounds. No murmur.   Pulmonary:      Effort: Pulmonary effort is normal. No respiratory distress.      Breath sounds: Normal breath sounds.   Abdominal:       Palpations: Abdomen is soft. There is no mass.      Tenderness: There is abdominal tenderness (Mild epigastric tenderness to palpation without significant tenderness through-out otherwise.). There is no guarding or rebound.      Hernia: No hernia is present.   Musculoskeletal: Normal range of motion.   Skin:     Findings: No rash.   Neurological:      Mental Status: He is alert and oriented to person, place, and time.      Cranial Nerves: No cranial nerve deficit.          MDM  Number of Diagnoses or Management Options  Fatigue, unspecified type: new and requires workup     Amount and/or Complexity of Data Reviewed  Clinical lab tests: ordered and reviewed  Tests in the radiology section of CPT??: ordered and reviewed  Tests in the medicine section of CPT??: ordered and reviewed  Review and summarize past medical records: yes    Risk of Complications, Morbidity, and/or Mortality  Presenting problems: high  Diagnostic procedures: high  Management options: high    Patient Progress  Patient progress: stable         Procedures  Recent Results (from the past 12 hour(s))   PLEASE READ & DOCUMENT PPD TEST IN 72 HRS    Collection Time: 03/05/19  3:49 PM   Result Value Ref Range    PPD Negative Negative    mm Induration 0 0 - 5 mm   CBC WITH AUTOMATED DIFF    Collection Time: 03/05/19  8:10 PM   Result Value Ref Range    WBC 6.0 4.3 - 11.1 K/uL    RBC 3.70 (L) 4.23 - 5.6 M/uL    HGB 11.2 (L) 13.6 - 17.2 g/dL  HCT 34.4 (L) 41.1 - 50.3 %    MCV 93.0 79.6 - 97.8 FL    MCH 30.3 26.1 - 32.9 PG    MCHC 32.6 31.4 - 35.0 g/dL    RDW 15.9 (H) 11.9 - 14.6 %    PLATELET 213 150 - 450 K/uL    MPV 11.9 9.4 - 12.3 FL    ABSOLUTE NRBC 0.00 0.0 - 0.2 K/uL    DF AUTOMATED      NEUTROPHILS 72 43 - 78 %    LYMPHOCYTES 18 13 - 44 %    MONOCYTES 8 4.0 - 12.0 %    EOSINOPHILS 1 0.5 - 7.8 %    BASOPHILS 0 0.0 - 2.0 %    IMMATURE GRANULOCYTES 2 0.0 - 5.0 %    ABS. NEUTROPHILS 4.3 1.7 - 8.2 K/UL    ABS. LYMPHOCYTES 1.1 0.5 - 4.6 K/UL     ABS. MONOCYTES 0.5 0.1 - 1.3 K/UL    ABS. EOSINOPHILS 0.0 0.0 - 0.8 K/UL    ABS. BASOPHILS 0.0 0.0 - 0.2 K/UL    ABS. IMM. GRANS. 0.1 0.0 - 0.5 K/UL   METABOLIC PANEL, COMPREHENSIVE    Collection Time: 03/05/19  8:10 PM   Result Value Ref Range    Sodium 142 136 - 145 mmol/L    Potassium 4.0 3.5 - 5.1 mmol/L    Chloride 112 (H) 98 - 107 mmol/L    CO2 23 21 - 32 mmol/L    Anion gap 7 7 - 16 mmol/L    Glucose 97 65 - 100 mg/dL    BUN 14 6 - 23 MG/DL    Creatinine 0.96 0.8 - 1.5 MG/DL    GFR est AA >60 >60 ml/min/1.75m    GFR est non-AA >60 >60 ml/min/1.767m   Calcium 8.2 (L) 8.3 - 10.4 MG/DL    Bilirubin, total 5.1 (H) 0.2 - 1.1 MG/DL    ALT (SGPT) 409 (H) 12 - 65 U/L    AST (SGOT) 411 (H) 15 - 37 U/L    Alk. phosphatase 239 (H) 50 - 136 U/L    Protein, total 6.8 6.3 - 8.2 g/dL    Albumin 1.8 (L) 3.5 - 5.0 g/dL    Globulin 5.0 (H) 2.3 - 3.5 g/dL    A-G Ratio 0.4 (L) 1.2 - 3.5     LIPASE    Collection Time: 03/05/19  8:10 PM   Result Value Ref Range    Lipase 900 (H) 73 - 393 U/L     Xr Ercp / Ercb Combined    Result Date: 03/04/2019  ERCP images History: OR 1, 57 years [facility] FOR MORE INFO FLUOR TIME 2 MIN 16 SEC, 5 images saved Comparison: Abdominal ultrasound March 01, 2019 Findings:  ERCP was performed by gastroenterology service.  Images are interpreted here.  Please see dedicated report by GI service for complete details.  ERCP images demonstrate normal-appearing intrahepatic and extrahepatic biliary ducts, without evidence of filling defect to suggest choledocholithiasis.  Balloon papillotomy was performed.     Impression: ERCP images as above. Fluoroscopy time: 2:16 min. Total number of fluoro images obtained: 5    Ct Head Wo Cont    Result Date: 03/02/2019  CT HEAD WITHOUT CONTRAST. INDICATION: Headache and fall. COMPARISON: None.  TECHNIQUE:   5 mm axial scans from the skull base to the vertex.  Our CT  scanners use one or more of the  following:  Automated exposure control, adjustment of the mA and or kV according to patient size, iterative reconstruction. FINDINGS:  No acute intraparenchymal hemorrhage or abnormal extra-axial fluid collection.  The ventricles are normal size.  No midline shift or mass effect. Included portion of the paranasal sinuses and orbits grossly unremarkable.     IMPRESSION:  Negative for acute intracranial abnormality.     [place] Abd Comp    Result Date: 03/01/2019  ABDOMINAL ULTRASOUND. HISTORY: Elevated liver enzymes, jaundice and abdominal pain. COMPARISON: None FINDINGS: Pancreas is grossly unremarkable although the tail is partially obscured by overlying bowel gas.. Aorta is normal caliber, 2.6 cm. The IVC is patent. The enlarged spleen has a homogenous echotexture and measures 23 cm. Left kidney measures 15.1 cm included upper pole cyst calcification. No hydronephrosis. Right kidney is unremarkable without hydronephrosis and measures 13.6 cm. Renal echogenicity is normal, the right kidney is hypoechoic to the liver. The intrahepatic biliary tree is not dilated. There is hepatopetal flow in the portal vein. Small echogenic hemangioma right lobe liver, 9 x 13 mm. The gallbladder wall is thickened, up to 8 mm. Multiple small echogenic shadowing gallstones. The common bile duct is not dilated, 6 mm.     IMPRESSION: Cholelithiasis with gallbladder wall thickening, possibly acute or chronic cholecystitis. No biliary tree obstruction.     Xr Chest Port    Result Date: 03/01/2019  1 View portable chest x-ray 03/01/2019 8:32 AM Indication: Chest pain Comparison: 12/08/2010 Findings: This portable upright AP chest of 0809 shows the monitoring leads overlying the chest. Lung volumes are low, only slight crowding though suggested at the bases. Cardiomediastinal silhouette within normal limits. No confluent infiltrate, no significant effusion. Vascularity seems appropriate.      IMPRESSION: Mild pulmonary hypoinflation. Otherwise no acute infiltrate suggested.     58 yo male with transaminitis:    I discussed patient with Dr. Loletta Specter who is on for hospitalist given that the patient was just discharged 1 hour ago and that all the enzymes are continuing to elevate and that he does appear dehydrated with persistent vomiting for admission and further work-up and management.

## 2019-03-05 NOTE — ED Triage Notes (Addendum)
Patient discharged from 6th floor approx 1 hour ago.  Had difficulty getting patient off of 6th floor, had security involved, who escorted patient off of floor and to ER.  Patient homeless.  Patient uncooperative with social work and with ER staff.  Denies alcohol, drug abuse, and denies being agreeable to discharge.  Patient reports "my enzymes are high and I need them rechecked."

## 2019-03-05 NOTE — Progress Notes (Signed)
[facility] Progress Note         Admit Date:  03/01/2019    Today's Date:  03/05/2019    CC:  Elevated LFT's    Subjective:     Patient reports continued generalized abd pain, worse after ERCP yesterday but about the same as yesterday today.  Denies nausea and tolerating clears, wants to advance diet.  Reports regular BM this am.  ERCP negative yesterday with sphincterotomy.    Medications:   Current Facility-Administered Medications   Medication Dose Route Frequency   ??? lactated Ringers infusion  75 mL/hr IntraVENous ENDO CONTINUOUS   ??? pantoprazole (PROTONIX) tablet 40 mg  40 mg Oral DAILY   ??? acetaminophen (TYLENOL) tablet 650 mg  650 mg Oral Q6H PRN   ??? promethazine (PHENERGAN) with saline injection 12.5 mg  12.5 mg IntraVENous Q6H PRN   ??? sodium chloride (NS) flush 5-40 mL  5-40 mL IntraVENous Q8H   ??? sodium chloride (NS) flush 5-40 mL  5-40 mL IntraVENous PRN   ??? diphenhydrAMINE (BENADRYL) capsule 25 mg  25 mg Oral Q4H PRN   ??? ondansetron (ZOFRAN) injection 4 mg  4 mg IntraVENous Q4H PRN   ??? senna-docusate (PERICOLACE) 8.6-50 mg per tablet 2 Tab  2 Tab Oral DAILY PRN   ??? heparin (porcine) injection 5,000 Units  5,000 Units SubCUTAneous Q8H   ??? albuterol-ipratropium (DUO-NEB) 2.5 MG-0.5 MG/3 ML  3 mL Nebulization Q6H PRN   ??? piperacillin-tazobactam (ZOSYN) 3.375 g in 0.9% sodium chloride (MBP/ADV) 100 mL  3.375 g IntraVENous Q8H   ??? morphine injection 1 mg  1 mg IntraVENous Q4H PRN   ??? influenza vaccine 2019-20 (6 mos+)(PF) (FLUARIX/FLULAVAL/FLUZONE QUAD) injection 0.5 mL  0.5 mL IntraMUSCular PRIOR TO DISCHARGE       Review of Systems:  ROS was obtained, with pertinent positives as listed above.  No chest pain or SOB.    Diet:  Clears    Objective:   Vitals:  Visit Vitals  BP 97/51   Pulse (!) 59   Temp 97.5 ??F (36.4 ??C)   Resp 18   Ht 5\' 10"  (1.778 m)   Wt 93.9 kg (207 lb)   SpO2 99%   BMI 29.70 kg/m??     Intake/Output:  03/11 0701 - 03/11 1900  In: -   Out: 400 [Urine:400]   03/09 1901 - 03/11 0700  In: 1415 [P.O.:840; I.V.:575]  Out: 1425 [Urine:1425]  Exam:  General appearance: alert, cooperative, no distress, slightly jaundice  Lungs: clear to auscultation bilaterally anteriorly  Heart: regular rate and rhythm  Abdomen: soft, non-tender. Bowel sounds normal. No masses, no organomegaly  Extremities: extremities normal, atraumatic, no cyanosis or edema  Neuro:  alert and oriented    Data Review (Labs):    Recent Labs     03/05/19  0543 03/04/19  0451 03/03/19  0526   WBC 3.0* 3.0* 3.4*   HGB 10.5* 9.4* 9.9*   HCT 33.6* 29.3* 31.0*   PLT 149* 140* 132*   MCV 97.7 93.9 94.5   NA 141 141 142   K 5.1 4.0 3.8   CL 112* 111* 112*   CO2 21 22 22    BUN 16 11 12    CREA 0.90 0.84 0.81   CA 7.9* 7.7* 7.6*   GLU 116* 99 90   AP 201* 185* 187*   SGOT 376* 417* 491*   ALT 412* 425* 572*   TBILI 4.6* 4.2* 4.4*   CBIL 4.1* 3.7* 3.8*  ALB 1.7* 1.5* 1.8*   TP 5.9* 5.7* 5.9*   PTP  --   --  15.8*   INR  --   --  1.2   APTT  --   --  41.6*     ??  ?? Ref. Range 03/01/2019 08:11 03/01/2019 10:31 03/01/2019 15:00 03/01/2019 21:23 03/02/2019 06:01 03/02/2019 08:08 03/02/2019 15:15   LD Latest Ref Range: 100 - 190 U/L ?? ?? 215 (H) ?? ?? ?? ??   Lactic acid Latest Ref Range: 0.4 - 2.0 MMOL/L ?? 4.4 (HH) 2.6 (HH) ?? ?? 2.2 (HH) 2.5 (HH)   Procalcitonin Latest Units: ng/mL 3.44 ?? ?? ?? ?? ?? ??   Troponin-I, Qt. Latest Ref Range: 0.02 - 0.05 NG/ML <0.02 (L) ?? ?? ?? ?? ?? ??   Iron Latest Ref Range: 35 - 150 ug/dL ?? ?? 68 ?? ?? ?? ??   TIBC Latest Ref Range: 250 - 450 ug/dL ?? ?? 168 (L) ?? ?? ?? ??   Transferrin Saturation Latest Ref Range: >20 % ?? ?? 40 ?? ?? ?? ??   Ferritin Latest Ref Range: 8 - 388 NG/ML ?? ?? 411 (H) ?? ?? ?? ??   Ammonia Latest Ref Range: 11 - 32 UMOL/L ?? ?? ?? 22 ?? ?? ??   ??  ?? Ref. Range 03/01/2019 15:00   Acetaminophen level Latest Ref Range: 10.0 - 30.0 ug/mL <10 (L)   ??  ?? Ref. Range 03/01/2019 12:49   AMPHETAMINES Latest Units:   POSITIVE   BARBITURATES Latest Units:   NEGATIVE   BENZODIAZEPINES Latest Units:   NEGATIVE    COCAINE Latest Units:   NEGATIVE   METHADONE Latest Units:   NEGATIVE   OPIATES Latest Units:   NEGATIVE   PCP(PHENCYCLIDINE) Latest Units:   NEGATIVE   THC (TH-CANNABINOL) Latest Units:   NEGATIVE   ??  ?? Ref. Range 03/01/2019 17:51   Occult blood, stool Latest Ref Range: NEG   NEGATIVE   ??  HEPATITIS PANEL, ACUTE [[id number]] (Order [id number]) Lab              Date: 03/01/2019 Department: [facility] Released By: Saralyn Pilar, RN (auto-released) Authorizing: Hal Morales, MD   Component Value Flag Ref Range Units Status   Hepatitis A Ab, IgM Positive  Abnormal   NEGATIVE ?? Final   Hep B surface Ag screen NEGATIVE   ?? NEGATIVE ?? Final   Hep B Core Ab, IgM NEGATIVE   ?? NEGATIVE ?? Final   Hep C Virus Ab 0.1  ?? 0.0 - 0.9 s/co ratio Final   Comment:   (NOTE)   ?? ?? ?? ?? ?? ?? ?? ?? ?? ?? ?? ?? ?? ?? ?? ?? ??Negative: ?? ?? < 0.8   ?? ?? ?? ?? ?? ?? ?? ?? ?? ?? ?? ?? ?? ?? Indeterminate: 0.8 - 0.9   ?? ?? ?? ?? ?? ?? ?? ?? ?? ?? ?? ?? ?? ?? ?? ?? ??Positive: ?? ?? > 0.9    ??  BLOOD CULTURE ID PANEL [[id number]] (Order [id number]) Microbiology               Date: 03/03/2019 Department: [facility] Released By: ??(auto-released) Authorizing: Virgina Evener, MD   Specimen Information: Blood   ?? ??   Component Value Flag Ref Range Units Status   Acc. no. from Micro Order [id number]  ?? ?? ?? Final   Staphylococcus DETECTED  Abnormal   NOTDET ??  Final   Comment:   RESULTS VERIFIED, PHONED TO AND READ BACK BY   Laurence Aly @ [phone] ON 03/03/2019 AK.    mecA (Methicillin-Resistance Genes) NOT DETECTED  ?? NOTDET ?? Final   INTERPRETATION ?? ?? ?? ?? Final   Gram positive cocci in clusters. Identified by realtime PCR as ??Coagulase negative Staphylococci    Comment:   A single positive culture of coagulase negative Staph is likely to be a contaminant in adult patients. Consider discontinuation of antibiotics for gram positive bloodstream infections if patient asymptomatic. ??THIS TEST DOES NOT REPLACE SENSITIVITY TESTING.   ??  ABDOMINAL ULTRASOUND.??03/01/2019??   HISTORY: Elevated liver enzymes, jaundice and abdominal pain.??  COMPARISON: None??  FINDINGS: Pancreas is grossly unremarkable although the tail is partially  obscured by overlying bowel gas.. Aorta is normal caliber, 2.6 cm. The IVC is  patent. The enlarged spleen has a homogenous echotexture and measures 23 cm.   Left kidney measures 15.1 cm included upper pole cyst calcification. No  hydronephrosis. Right kidney is unremarkable without hydronephrosis and measures  13.6 cm. Renal echogenicity is normal, the right kidney is hypoechoic to the  liver. The intrahepatic biliary tree is not dilated. There is hepatopetal flow  in the portal vein. Small echogenic hemangioma right lobe liver, 9 x 13 mm. The  gallbladder wall is thickened, up to 8 mm. Multiple small echogenic shadowing  gallstones. The common bile duct is not dilated, 6 mm. ??  IMPRESSION  IMPRESSION: Cholelithiasis with gallbladder wall thickening, possibly acute or  chronic cholecystitis. No biliary tree obstruction.  ??  Ceruloplasmin, haptoglobin pending 01 March 2019    ERCP Dr Salley Hews 03/04/19  Findings:   Esophagus- Normal.  Stomach- Normal.  Duodenum- Normal.  Major duodenal papilla- Normal.  Common bile duct- Normal.  Narrow/ not dilated.  Intrahepatic ducts- Normal.  Pancreatic duct- Not entered.  Gallbladder- Filled.  Therapies: Sphincterotomy  Specimens: None  Estimated Blood Loss: 0 cc  Complications:   None; patient tolerated the procedure well.  Attending Attestation:  I performed the procedure.   Impression:    Normal ERCP.  Sphincterotomy performed.  No bile duct stone found.  Recommendations:  Continue supportive care for HAV??    Assessment:     Principal Problem:    Elevated liver enzymes (03/01/2019)    Active Problems:    Jaundice (03/01/2019)      CAD (coronary artery disease) (03/01/2019)      COPD (03/01/2019)      Normocytic anemia (03/01/2019)      Lactic acidosis (03/01/2019)      Pancytopenia (HCC) (03/02/2019)       58 yo male, now pt of Dr. Patty Sermons, with PMH of CAD on Plavix, COPD, HTN, Seizures, GERD, ulcers, who was seen in consult 02 March 2019 for abnormal LFTs.  He presented with RUQ abd pain, nausea, and was noted to have abnormal labs in the ER with T bili 4.2, direct bili 3.7, ALT 683, AST 598, AP 170, lactic acid 2.2, WBC 2.9, Hgb 9.8, Hct 29.5, plt 122, INR 1.4, BUN 11, Creat 0.94.  [place] noted cholelithiasis with gallbladder wall thickening, possibly acute or chronic cholecystitis, but no biliary tree obstruction. WAs not able to perform MRI d/t claustrophobia.  ERCP yesterday negative.  LFT's stable. Continues to have abd pain but nausea improved.  Hep A Ab IgM was positive, which may be the cause of his symptoms.Hgb is stable, with no iron def noted and hemoccult was negative.  ??  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    - Supportive care for acute Hep A  - Requests advance diet and nausea resolved so will advance as tolerated    Patient is seen and examined in collaboration with Dr. Shari Prowshristopher Fyock.  Assessment and plan as per Dr. Salley HewsFyock.  ??  Cammie Mcgeeeresa D Alima Naser, NP-C  [facility]

## 2019-03-05 NOTE — Progress Notes (Signed)
Hourly rounds completed, all needs are met. Will continue to monitor and report given to oncoming nurse.

## 2019-03-05 NOTE — Anesthesia Post-Procedure Evaluation (Signed)
Procedure(s):  ENDOSCOPIC RETROGRADE CHOLANGIOPANCREATOGRAPHY (ERCP)  ENDOSCOPIC SPHINCTEROTOMY  ENDOSCOPIC STONE EXTRACTION/BALLOON SWEEP.    general    Anesthesia Post Evaluation      Multimodal analgesia: multimodal analgesia used between 6 hours prior to anesthesia start to PACU discharge  Patient location during evaluation: PACU  Patient participation: complete - patient participated  Level of consciousness: awake and alert  Pain management: adequate  Airway patency: patent  Anesthetic complications: no  Cardiovascular status: acceptable  Respiratory status: acceptable  Hydration status: acceptable  Post anesthesia nausea and vomiting:  none      Vitals Value Taken Time   BP 120/66 03/04/2019  2:55 PM   Temp 36.6 ??C (97.8 ??F) 03/04/2019  2:55 PM   Pulse 87 03/04/2019  2:58 PM   Resp 20 03/04/2019  2:55 PM   SpO2 97 % 03/04/2019  2:55 PM   Vitals shown include unvalidated device data.

## 2019-03-05 NOTE — Progress Notes (Signed)
CM assisted patient with calling the [facility] for homeless assistance.  CM left message for Marsha Bellinger-shields to call patient.  CM informed patient to answer the telephone.

## 2019-03-05 NOTE — Progress Notes (Addendum)
[facility]  [address], [address]  [place] [address]  [phone]    H&P/Consult Note/Progress Note:   Vincent Smith  MRN: [id number]  DOB:07-17-1961  Age:58 y.o.    HPI: Vincent Smith is a 58 y.o. male who has PMHx of COPD, seizures, CAD s/p MI with stents (3-4 years ago, not on [facility]) who we are asked by GI to see for cholecystectomy.  Pt initially presented to the ED on 03/01/2019 with vague complaints of malaise, fatigue, fever, poor appetite, and chest pain. Pt was found to have lactic acidosis, pancytopenia and splenomegaly, jaundice with elevated LFTs, tbili, and Hepatitis A. He is homeless and follows with the [place].  Abd US demonstrates gallstones and thick wall gallbladder with acute vs chronic cholecystitis. GI was consulted for evaluation and ordered MRCP which pt has not been able to complete due to claustrophobia.  Pt is going for ERCP today. WBD 3, PLTs 140.  tbili 4.2, LFTs trending down.  Lactic 1.8 and trending down. Blood cultures with GPCs, pt on zosyn and vanco. General surgery was consulted for cholecystectomy.     HIV results pending.  Pt denies h/o abdominal surgery.    03/05/19: Appears confused today; calling out obscenities to someone he sees standing outside the door though no one there. tbili 4.6, alt 412, ast 376, alp 201 today. Continues to complain of abdominal pain.     Past Medical History:   Diagnosis Date   ??? CAD (coronary artery disease)    ??? COPD    ??? Gastrointestinal disorder     acid reflux and ulcers   ??? Hypertension    ??? Seizures ([place])      Past Surgical History:   Procedure Laterality Date   ??? CARDIAC SURG PROCEDURE UNLIST      2 stents     Current Facility-Administered Medications   Medication Dose Route Frequency   ??? lactated Ringers infusion  75 mL/hr IntraVENous ENDO CONTINUOUS   ??? pantoprazole (PROTONIX) tablet 40 mg  40 mg Oral DAILY   ??? acetaminophen (TYLENOL) tablet 650 mg  650 mg Oral Q6H PRN    ??? promethazine (PHENERGAN) with saline injection 12.5 mg  12.5 mg IntraVENous Q6H PRN   ??? sodium chloride (NS) flush 5-40 mL  5-40 mL IntraVENous Q8H   ??? sodium chloride (NS) flush 5-40 mL  5-40 mL IntraVENous PRN   ??? diphenhydrAMINE (BENADRYL) capsule 25 mg  25 mg Oral Q4H PRN   ??? ondansetron (ZOFRAN) injection 4 mg  4 mg IntraVENous Q4H PRN   ??? senna-docusate (PERICOLACE) 8.6-50 mg per tablet 2 Tab  2 Tab Oral DAILY PRN   ??? heparin (porcine) injection 5,000 Units  5,000 Units SubCUTAneous Q8H   ??? albuterol-ipratropium (DUO-NEB) 2.5 MG-0.5 MG/3 ML  3 mL Nebulization Q6H PRN   ??? piperacillin-tazobactam (ZOSYN) 3.375 g in 0.9% sodium chloride (MBP/ADV) 100 mL  3.375 g IntraVENous Q8H   ??? morphine injection 1 mg  1 mg IntraVENous Q4H PRN   ??? influenza vaccine 2019-20 (6 mos+)(PF) (FLUARIX/FLULAVAL/FLUZONE QUAD) injection 0.5 mL  0.5 mL IntraMUSCular PRIOR TO DISCHARGE     Tramadol  Social History     Socioeconomic History   ??? Marital status: MARRIED     Spouse name: Not on file   ??? Number of children: Not on file   ??? Years of education: Not on file   ??? Highest education level: Not on  file   Tobacco Use   ??? Smoking status: Former Smoker     Packs/day: 2.00   ??? Smokeless tobacco: Former Systems developer     Quit date: 03/03/2013   Substance and Sexual Activity   ??? Alcohol use: No   ??? Drug use: Not Currently     Types: Marijuana     Social History     Tobacco Use   Smoking Status Former Smoker   ??? Packs/day: 2.00   Smokeless Tobacco Former User   ??? Quit date: 03/03/2013     Family History   Problem Relation Age of Onset   ??? Hypertension Mother    ??? Hypertension Father    ??? Diabetes Father      ROS: The patient has difficulty with chest pain & shortness of breath.  + fever or chills.  Comprehensive review of systems was otherwise unremarkable except as noted above.    Physical Exam:   Visit Vitals  BP 96/54   Pulse 60   Temp 97.9 ??F (36.6 ??C)   Resp 18   Ht 5' 10"  (1.778 m)   Wt 207 lb (93.9 kg)   SpO2 98%   BMI 29.70 kg/m??      Constitutional: Alert, cooperative patient in no acute distress; appears stated age. Appears jaundiced.  Eyes:Sclera are icteric. EOMs intact  ENMT: no external lesions gross hearing normal; no obvious neck masses, no ear or lip lesions, nares normal  CV: RRR. Normal perfusion  Resp: No JVD.  Breathing is  non-labored; no audible wheezing.    GI: soft and mildly distended; diffuse tenderness, worse in RUQ.  Palpable splenomegaly. + umbilical hernia. No guarding.   Musculoskeletal: unremarkable with normal function. No embolic signs or cyanosis.   Neuro: yelling obscenities; moves all 4; no focal deficits  Psychiatric: agitated affect and mood, no memory impairment    Recent vitals (if inpt):  Patient Vitals for the past 24 hrs:   BP Temp Pulse Resp SpO2 Weight   03/05/19 1108 96/54 97.9 ??F (36.6 ??C) 60 18 98 % ???   03/05/19 0847 ??? ??? ??? ??? 99 % ???   03/05/19 0746 97/51 97.5 ??F (36.4 ??C) (!) 59 18 97 % ???   03/05/19 0415 138/71 97.8 ??F (36.6 ??C) 69 18 96 % ???   03/04/19 2310 93/56 98 ??F (36.7 ??C) 60 18 95 % ???   03/04/19 1943 104/65 97.7 ??F (36.5 ??C) 71 18 97 % ???   03/04/19 1650 102/54 98 ??F (36.7 ??C) 73 20 95 % 207 lb (93.9 kg)   03/04/19 1455 120/66 97.8 ??F (36.6 ??C) 79 20 97 % ???   03/04/19 1453 ??? ??? ??? 16 97 % ???   03/04/19 1450 128/68 ??? 81 16 97 % ???   03/04/19 1444 130/65 97.6 ??F (36.4 ??C) 85 16 96 % ???   03/04/19 1402 105/69 ??? 70 16 100 % ???       Labs:  Recent Labs     03/05/19  0543  03/03/19  0526   WBC 3.0*   < > 3.4*   HGB 10.5*   < > 9.9*   PLT 149*   < > 132*   NA 141   < > 142   K 5.1   < > 3.8   CL 112*   < > 112*   CO2 21   < > 22   BUN 16   < > 12   CREA 0.90   < >  0.81   GLU 116*   < > 90   PTP  --   --  15.8*   INR  --   --  1.2   APTT  --   --  41.6*   TBILI 4.6*   < > 4.4*   CBIL 4.1*   < > 3.8*   SGOT 376*   < > 491*   ALT 412*   < > 572*   AP 201*   < > 187*   TROIQ  --   --  <0.02*    < > = values in this interval not displayed.       Lab Results   Component Value Date/Time     WBC 3.0 (L) 03/05/2019 05:43 AM    HGB 10.5 (L) 03/05/2019 05:43 AM    PLATELET 149 (L) 03/05/2019 05:43 AM    Sodium 141 03/05/2019 05:43 AM    Potassium 5.1 03/05/2019 05:43 AM    Chloride 112 (H) 03/05/2019 05:43 AM    CO2 21 03/05/2019 05:43 AM    BUN 16 03/05/2019 05:43 AM    Creatinine 0.90 03/05/2019 05:43 AM    Glucose 116 (H) 03/05/2019 05:43 AM    INR 1.2 03/03/2019 05:26 AM    aPTT 41.6 (H) 03/03/2019 05:26 AM    Bilirubin, total 4.6 (H) 03/05/2019 05:43 AM    Bilirubin, direct 4.1 (H) 03/05/2019 05:43 AM    AST (SGOT) 376 (H) 03/05/2019 05:43 AM    ALT (SGPT) 412 (H) 03/05/2019 05:43 AM    Alk. phosphatase 201 (H) 03/05/2019 05:43 AM    Lipase 94 12/08/2010 12:50 AM    Ammonia 22 03/22/2019 09:23 PM    Troponin-I <0.05 12/08/2010 03:28 AM    Troponin-I, Qt. <0.02 (L) 03/03/2019 05:26 AM       I reviewed recent labs and recent radiologic studies.  [place] Abd 03-22-2019:  FINDINGS: Pancreas is grossly unremarkable although the tail is partially  obscured by overlying bowel gas.. Aorta is normal caliber, 2.6 cm. The IVC is  patent. The enlarged spleen has a homogenous echotexture and measures 23 cm.   Left kidney measures 15.1 cm included upper pole cyst calcification. No  hydronephrosis. Right kidney is unremarkable without hydronephrosis and measures  13.6 cm. Renal echogenicity is normal, the right kidney is hypoechoic to the  liver. The intrahepatic biliary tree is not dilated. There is hepatopetal flow  in the portal vein. Small echogenic hemangioma right lobe liver, 9 x 13 mm. The  gallbladder wall is thickened, up to 8 mm. Multiple small echogenic shadowing  gallstones. The common bile duct is not dilated, 6 mm.   ??  IMPRESSION  IMPRESSION: Cholelithiasis with gallbladder wall thickening, possibly acute or  chronic cholecystitis. No biliary tree obstruction.    I independently reviewed radiology images for studies I described above or studies I have ordered.   Admission date (for inpatients): 03-22-2019    * No surgery date entered *  Procedure(s):  ENDOSCOPIC RETROGRADE CHOLANGIOPANCREATOGRAPHY (ERCP)  ENDOSCOPIC SPHINCTEROTOMY  ENDOSCOPIC STONE EXTRACTION/BALLOON SWEEP    ASSESSMENT/PLAN:  Problem List  Date Reviewed: 03-22-19          Codes Class Noted    Pancytopenia ([place]) ICD-10-CM: A63.016  ICD-9-CM: 284.19  03/02/2019        Jaundice ICD-10-CM: R17  ICD-9-CM: 782.4  Mar 22, 2019        CAD (coronary artery disease) ICD-10-CM: I25.10  ICD-9-CM: 414.00  22-Mar-2019  COPD ICD-9-CM: 782  03/01/2019        Normocytic anemia ICD-10-CM: D64.9  ICD-9-CM: 285.9  03/01/2019        Lactic acidosis ICD-10-CM: E87.2  ICD-9-CM: 276.2  03/01/2019        * (Principal) Elevated liver enzymes ICD-10-CM: R74.8  ICD-9-CM: 790.5  03/01/2019            Principal Problem:    Elevated liver enzymes (03/01/2019)    Active Problems:    Jaundice (03/01/2019)      CAD (coronary artery disease) (03/01/2019)      COPD (03/01/2019)      Normocytic anemia (03/01/2019)      Lactic acidosis (03/01/2019)      Pancytopenia ([place]) (03/02/2019)       Plan:  ERCP showed normal biliary tree and filling of gallbladder, therefore no acute cholecystitis or possible explanation of LFTs from the gallbladder.  Hep A IgM + and likely patient has acute Hepatitis A  Cholelithiasis with chronic cholecystitis would not produce these abnormalities.  Vague admission symptoms c/w viral hepatitis  It is not proper time to perform cholecystectomy, which could be done electively in the future as he recovers from Hep A  Discussed with Dr. Ralph Dowdy  Diet per GI  Pain control  Follow labs    I have personally performed a face-to-face diagnostic evaluation and management  service on this patient.    I have independently seen the patient.   I have independently obtained the above history from the patient/family.    I have independently examined the patient with above findings.  I have independently reviewed data/labs for this patient and developed the above plan of care (MDM).   Signed: Nicoletta Dress, MD, FACS

## 2019-03-05 NOTE — Anesthesia Post-Procedure Evaluation (Signed)
Procedure(s):  ENDOSCOPIC RETROGRADE CHOLANGIOPANCREATOGRAPHY (ERCP)  ENDOSCOPIC SPHINCTEROTOMY  ENDOSCOPIC STONE EXTRACTION/BALLOON SWEEP.    general    Anesthesia Post Evaluation      Multimodal analgesia: multimodal analgesia used between 6 hours prior to anesthesia start to PACU discharge  Patient location during evaluation: PACU  Patient participation: complete - patient participated  Level of consciousness: awake and alert  Pain management: adequate  Airway patency: patent  Anesthetic complications: no  Cardiovascular status: acceptable  Respiratory status: acceptable  Hydration status: acceptable  Post anesthesia nausea and vomiting:  none      Vitals Value Taken Time   BP 120/66 03/04/2019  2:55 PM   Temp 36.6 ??C (97.8 ??F) 03/04/2019  2:55 PM   Pulse 87 03/04/2019  2:58 PM   Resp 20 03/04/2019  2:55 PM   SpO2 97 % 03/04/2019  2:55 PM   Vitals shown include unvalidated device data.

## 2019-03-05 NOTE — ED Notes (Signed)
 Patient recently discharged from hospital today. States that he is having the same problems as before. Feels weak, sob and hurts when he coughs. States that his belly is bloated. was recently diagnosed with hepatitis. States that he is unable to care for himself

## 2019-03-05 NOTE — ED Notes (Signed)
 Patient discharged from 6th floor approx 1 hour ago.  Had difficulty getting patient off of 6th floor, had security involved, who escorted patient off of floor and to ER.  Patient homeless.  Patient uncooperative with social work and with ER staff.  Denies alcohol, drug abuse, and denies being agreeable to discharge.  Patient reports my enzymes are high and I need them rechecked.

## 2019-03-05 NOTE — Progress Notes (Signed)
MD Sofie Hartigan was notified of pt reporting he was told that "that GI doctor told me I wouldn't be sent out in the streets with no place to go' MD Sofie Hartigan has confirmed with GI provider that the patient can be discharged. Charge RN, Idalia Needle was contacted and she contacted Database administrator and security. Charge RN was very patient, and helpful.    This is unfortunate and upon reflection and review of day's events, it it not unreasonable to report that the patient appeared to have contradictory behaviors and was also untruthful with Clinical research associate repeatedly throughout the day, and with CW and witnessed by Clinical research associate.     The patient's behavior may be a conscious choice, or not. However,the pattern of behavior proves challenging and may be perceived as a significant barrier.    Best efforts to be of good help were afforded. And afforded again. And again.     Being of good help and in pursuit of the the mission, vision and values is paramount.  The staff have given their best efforts with available resources for the patient.      He refused to sign discharge papers. PIV was discontinued with catheter intact.     Taken to main exit via WC.

## 2019-03-05 NOTE — ED Provider Notes (Signed)
Patient is a 58 year old male who presents with abdominal pain in his epigastric region, fatigue, weakness, difficulty standing to walk he states because of his severe fatigue and dehydration states nausea and vomiting since discharge 1 hour prior to arrival in the ER.  Full history can be obtained by reviewing the discharge note from earlier today however in brief he was seen by GI for elevated LFTs and had a ERCP/MRCP which did not reveal any acute etiology is hepatitis panel was negative and work-up otherwise unrevealing of his acute transaminitis.             Past Medical History:   Diagnosis Date   ??? CAD (coronary artery disease)    ??? COPD    ??? Gastrointestinal disorder     acid reflux and ulcers   ??? Hypertension    ??? Seizures ([place])        Past Surgical History:   Procedure Laterality Date   ??? CARDIAC SURG PROCEDURE UNLIST      2 stents         Family History:   Problem Relation Age of Onset   ??? Hypertension Mother    ??? Hypertension Father    ??? Diabetes Father        Social History     Socioeconomic History   ??? Marital status: MARRIED     Spouse name: Not on file   ??? Number of children: Not on file   ??? Years of education: Not on file   ??? Highest education level: Not on file   Occupational History   ??? Not on file   Social Needs   ??? Financial resource strain: Not on file   ??? Food insecurity     Worry: Not on file     Inability: Not on file   ??? Transportation needs     Medical: Not on file     Non-medical: Not on file   Tobacco Use   ??? Smoking status: Former Smoker     Packs/day: 2.00   ??? Smokeless tobacco: Former Systems developer     Quit date: 03/03/2013   Substance and Sexual Activity   ??? Alcohol use: No   ??? Drug use: Not Currently     Types: Marijuana   ??? Sexual activity: Not on file   Lifestyle   ??? Physical activity     Days per week: Not on file     Minutes per session: Not on file   ??? Stress: Not on file   Relationships   ??? Social Product manager on phone: Not on file     Gets together: Not on file     Attends  religious service: Not on file     Active member of club or organization: Not on file     Attends meetings of clubs or organizations: Not on file     Relationship status: Not on file   ??? Intimate partner violence     Fear of current or ex partner: Not on file     Emotionally abused: Not on file     Physically abused: Not on file     Forced sexual activity: Not on file   Other Topics Concern   ??? Not on file   Social History Narrative   ??? Not on file         ALLERGIES: Tramadol    Review of Systems   Constitutional: Positive for fatigue. Negative for chills and fever.  HENT: Negative for rhinorrhea and sore throat.    Eyes: Negative for visual disturbance.   Respiratory: Negative for cough and shortness of breath.    Cardiovascular: Negative for chest pain and leg swelling.   Gastrointestinal: Positive for abdominal pain, diarrhea, nausea and vomiting.   Genitourinary: Negative for dysuria.   Musculoskeletal: Negative for back pain and neck pain.   Skin: Negative for rash.   Neurological: Negative for weakness and headaches.   Psychiatric/Behavioral: The patient is not nervous/anxious.        Vitals:    03/05/19 1957   BP: 106/74   Pulse: 79   Resp: 18   Temp: 98.1 ??F (36.7 ??C)   SpO2: 96%            Physical Exam  Vitals signs and nursing note reviewed.   Constitutional:       Appearance: He is well-developed.   HENT:      Head: Normocephalic.      Right Ear: External ear normal.      Left Ear: External ear normal.   Eyes:      Conjunctiva/sclera: Conjunctivae normal.      Pupils: Pupils are equal, round, and reactive to light.   Neck:      Musculoskeletal: Normal range of motion and neck supple.      Trachea: No tracheal deviation.   Cardiovascular:      Rate and Rhythm: Normal rate and regular rhythm.      Heart sounds: Normal heart sounds. No murmur.   Pulmonary:      Effort: Pulmonary effort is normal. No respiratory distress.      Breath sounds: Normal breath sounds.   Abdominal:      Palpations: Abdomen is  soft. There is no mass.      Tenderness: There is abdominal tenderness (Mild epigastric tenderness to palpation without significant tenderness through-out otherwise.). There is no guarding or rebound.      Hernia: No hernia is present.   Musculoskeletal: Normal range of motion.   Skin:     Findings: No rash.   Neurological:      Mental Status: He is alert and oriented to person, place, and time.      Cranial Nerves: No cranial nerve deficit.          MDM  Number of Diagnoses or Management Options  Fatigue, unspecified type: new and requires workup     Amount and/or Complexity of Data Reviewed  Clinical lab tests: ordered and reviewed  Tests in the radiology section of CPT??: ordered and reviewed  Tests in the medicine section of CPT??: ordered and reviewed  Review and summarize past medical records: yes    Risk of Complications, Morbidity, and/or Mortality  Presenting problems: high  Diagnostic procedures: high  Management options: high    Patient Progress  Patient progress: stable         Procedures  Recent Results (from the past 12 hour(s))   PLEASE READ & DOCUMENT PPD TEST IN 72 HRS    Collection Time: 03/05/19  3:49 PM   Result Value Ref Range    PPD Negative Negative    mm Induration 0 0 - 5 mm   CBC WITH AUTOMATED DIFF    Collection Time: 03/05/19  8:10 PM   Result Value Ref Range    WBC 6.0 4.3 - 11.1 K/uL    RBC 3.70 (L) 4.23 - 5.6 M/uL    HGB 11.2 (L) 13.6 - 17.2 g/dL  HCT 34.4 (L) 41.1 - 50.3 %    MCV 93.0 79.6 - 97.8 FL    MCH 30.3 26.1 - 32.9 PG    MCHC 32.6 31.4 - 35.0 g/dL    RDW 15.9 (H) 11.9 - 14.6 %    PLATELET 213 150 - 450 K/uL    MPV 11.9 9.4 - 12.3 FL    ABSOLUTE NRBC 0.00 0.0 - 0.2 K/uL    DF AUTOMATED      NEUTROPHILS 72 43 - 78 %    LYMPHOCYTES 18 13 - 44 %    MONOCYTES 8 4.0 - 12.0 %    EOSINOPHILS 1 0.5 - 7.8 %    BASOPHILS 0 0.0 - 2.0 %    IMMATURE GRANULOCYTES 2 0.0 - 5.0 %    ABS. NEUTROPHILS 4.3 1.7 - 8.2 K/UL    ABS. LYMPHOCYTES 1.1 0.5 - 4.6 K/UL    ABS. MONOCYTES 0.5 0.1 - 1.3 K/UL     ABS. EOSINOPHILS 0.0 0.0 - 0.8 K/UL    ABS. BASOPHILS 0.0 0.0 - 0.2 K/UL    ABS. IMM. GRANS. 0.1 0.0 - 0.5 K/UL   METABOLIC PANEL, COMPREHENSIVE    Collection Time: 03/05/19  8:10 PM   Result Value Ref Range    Sodium 142 136 - 145 mmol/L    Potassium 4.0 3.5 - 5.1 mmol/L    Chloride 112 (H) 98 - 107 mmol/L    CO2 23 21 - 32 mmol/L    Anion gap 7 7 - 16 mmol/L    Glucose 97 65 - 100 mg/dL    BUN 14 6 - 23 MG/DL    Creatinine 0.96 0.8 - 1.5 MG/DL    GFR est AA >60 >60 ml/min/1.55m    GFR est non-AA >60 >60 ml/min/1.757m   Calcium 8.2 (L) 8.3 - 10.4 MG/DL    Bilirubin, total 5.1 (H) 0.2 - 1.1 MG/DL    ALT (SGPT) 409 (H) 12 - 65 U/L    AST (SGOT) 411 (H) 15 - 37 U/L    Alk. phosphatase 239 (H) 50 - 136 U/L    Protein, total 6.8 6.3 - 8.2 g/dL    Albumin 1.8 (L) 3.5 - 5.0 g/dL    Globulin 5.0 (H) 2.3 - 3.5 g/dL    A-G Ratio 0.4 (L) 1.2 - 3.5     LIPASE    Collection Time: 03/05/19  8:10 PM   Result Value Ref Range    Lipase 900 (H) 73 - 393 U/L     Xr Ercp / Ercb Combined    Result Date: 03/04/2019  ERCP images History: OR 1, 57 years [facility] SEE EXAM REPORT FOR MORE INFO FLUOR TIME 2 MIN 16 SEC, 5 images saved Comparison: Abdominal ultrasound March 01, 2019 Findings:  ERCP was performed by gastroenterology service.  Images are interpreted here.  Please see dedicated report by GI service for complete details.  ERCP images demonstrate normal-appearing intrahepatic and extrahepatic biliary ducts, without evidence of filling defect to suggest choledocholithiasis.  Balloon papillotomy was performed.     Impression: ERCP images as above. Fluoroscopy time: 2:16 min. Total number of fluoro images obtained: 5    Ct Head Wo Cont    Result Date: 03/02/2019  CT HEAD WITHOUT CONTRAST. INDICATION: Headache and fall. COMPARISON: None.  TECHNIQUE:   5 mm axial scans from the skull base to the vertex.  Our CT scanners use one or more of the following:  Automated exposure control, adjustment of the mA and or kV  according to patient size, iterative reconstruction. FINDINGS:  No acute intraparenchymal hemorrhage or abnormal extra-axial fluid collection.  The ventricles are normal size.  No midline shift or mass effect. Included portion of the paranasal sinuses and orbits grossly unremarkable.     IMPRESSION:  Negative for acute intracranial abnormality.     [place] Abd Comp    Result Date: 03/01/2019  ABDOMINAL ULTRASOUND. HISTORY: Elevated liver enzymes, jaundice and abdominal pain. COMPARISON: None FINDINGS: Pancreas is grossly unremarkable although the tail is partially obscured by overlying bowel gas.. Aorta is normal caliber, 2.6 cm. The IVC is patent. The enlarged spleen has a homogenous echotexture and measures 23 cm. Left kidney measures 15.1 cm included upper pole cyst calcification. No hydronephrosis. Right kidney is unremarkable without hydronephrosis and measures 13.6 cm. Renal echogenicity is normal, the right kidney is hypoechoic to the liver. The intrahepatic biliary tree is not dilated. There is hepatopetal flow in the portal vein. Small echogenic hemangioma right lobe liver, 9 x 13 mm. The gallbladder wall is thickened, up to 8 mm. Multiple small echogenic shadowing gallstones. The common bile duct is not dilated, 6 mm.     IMPRESSION: Cholelithiasis with gallbladder wall thickening, possibly acute or chronic cholecystitis. No biliary tree obstruction.     Xr Chest Port    Result Date: 03/01/2019  1 View portable chest x-ray 03/01/2019 8:32 AM Indication: Chest pain Comparison: 12/08/2010 Findings: This portable upright AP chest of 0809 shows the monitoring leads overlying the chest. Lung volumes are low, only slight crowding though suggested at the bases. Cardiomediastinal silhouette within normal limits. No confluent infiltrate, no significant effusion. Vascularity seems appropriate.     IMPRESSION: Mild pulmonary hypoinflation. Otherwise no acute infiltrate suggested.     58 yo male with transaminitis:    I  discussed patient with Dr. Loletta Specter who is on for hospitalist given that the patient was just discharged 1 hour ago and that all the enzymes are continuing to elevate and that he does appear dehydrated with persistent vomiting for admission and further work-up and management.

## 2019-03-05 NOTE — Progress Notes (Signed)
Progress Notes by Cammie Mcgee, NP at 03/05/19 [phone]                Author: Cammie Mcgee, NP  Service: Gastroenterology  Author Type: Nurse Practitioner       Filed: 03/05/19 1011  Date of Service: 03/05/19 0936  Status: Attested           Editor: Cammie Mcgee, NP (Nurse Practitioner)  Cosigner: Shon Millet, MD at 03/05/19 1156          Attestation signed by Shon Millet, MD at 03/05/19 1156          Agree with note.  58 y/o homeless m here with jaundice.  ERCP was normal.  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  Surgery may do future cholecystectomy.  Plan for now  is supportive care.   London Sheer, MD                                        [facility] Progress Note               Admit Date:  03/01/2019      Today's Date:  03/05/2019      CC:  Elevated LFT's        Subjective:        Patient reports continued generalized abd pain, worse after ERCP yesterday but about the same as yesterday today.  Denies nausea and tolerating clears, wants to advance  diet.  Reports regular BM this am.  ERCP negative yesterday with sphincterotomy.      Medications:      Current Facility-Administered Medications          Medication  Dose  Route  Frequency           ?  lactated Ringers infusion   75 mL/hr  IntraVENous  ENDO CONTINUOUS     ?  pantoprazole (PROTONIX) tablet 40 mg   40 mg  Oral  DAILY     ?  acetaminophen (TYLENOL) tablet 650 mg   650 mg  Oral  Q6H PRN     ?  promethazine (PHENERGAN) with saline injection 12.5 mg   12.5 mg  IntraVENous  Q6H PRN     ?  sodium chloride (NS) flush 5-40 mL   5-40 mL  IntraVENous  Q8H     ?  sodium chloride (NS) flush 5-40 mL   5-40 mL  IntraVENous  PRN     ?  diphenhydrAMINE (BENADRYL) capsule 25 mg   25 mg  Oral  Q4H PRN     ?  ondansetron (ZOFRAN) injection 4 mg   4 mg  IntraVENous  Q4H PRN           ?  senna-docusate (PERICOLACE) 8.6-50 mg per tablet 2 Tab   2 Tab  Oral  DAILY PRN           ?  heparin (porcine) injection 5,000 Units   5,000 Units   SubCUTAneous  Q8H     ?  albuterol-ipratropium (DUO-NEB) 2.5 MG-0.5 MG/3 ML   3 mL  Nebulization  Q6H PRN     ?  piperacillin-tazobactam (ZOSYN) 3.375 g in 0.9% sodium chloride (MBP/ADV) 100 mL   3.375 g  IntraVENous  Q8H     ?  morphine injection 1 mg   1 mg  IntraVENous  Q4H  PRN           ?  influenza vaccine 2019-20 (6 mos+)(PF) (FLUARIX/FLULAVAL/FLUZONE QUAD) injection 0.5 mL   0.5 mL  IntraMUSCular  PRIOR TO DISCHARGE           Review of Systems:   ROS was obtained, with pertinent positives as listed above.  No chest pain or SOB.      Diet:  Clears        Objective:     Vitals:   Visit Vitals      BP  97/51     Pulse  (!) 59     Temp  97.5 ??F (36.4 ??C)     Resp  18     Ht   (1.778 m)     Wt  93.9 kg (207 lb)     SpO2  99%        BMI  29.70 kg/m??        Intake/Output:   03/11 0701 - 03/11 1900   In: -    Out: 400 [Urine:400]   03/09 1901 - 03/11 0700   In: 1415 [P.O.:840; I.V.:575]   Out: 1425 [Urine:1425]   Exam:   General appearance: alert, cooperative, no distress, slightly jaundice   Lungs: clear to auscultation bilaterally anteriorly   Heart: regular rate and rhythm   Abdomen: soft, non-tender. Bowel sounds normal. No masses, no organomegaly   Extremities: extremities normal, atraumatic, no cyanosis or edema   Neuro:  alert and oriented      Data Review (Labs):       Recent Labs             03/05/19   0543  03/04/19   0451  03/03/19   0526     WBC  3.0*  3.0*  3.4*     HGB  10.5*  9.4*  9.9*     HCT  33.6*  29.3*  31.0*     PLT  149*  140*  132*     MCV  97.7  93.9  94.5     NA  141  141  142     K  5.1  4.0  3.8     CL  112*  111*  112*     CO2  BUN  CREA  0.90  0.84  0.81     CA  7.9*  7.7*  7.6*     GLU  116*  99  90     AP  201*  185*  187*     SGOT  376*  417*  491*     ALT  412*  425*  572*     TBILI  4.6*  4.2*  4.4*     CBIL  4.1*  3.7*  3.8*     ALB  1.7*  1.5*  1.8*     TP  5.9*  5.7*  5.9*     PTP   --    --   15.8*     INR   --    --   1.2          APTT   --     --   41.6*        ??             ??  Ref. Range  03/01/2019 08:11  03/01/2019 10:31  03/01/2019 15:00  03/01/2019 21:23  03/02/2019 06:01  03/02/2019 08:08  03/02/2019 15:15     LD  Latest Ref Range: 100 - 190 U/L  ??  ??  215 (H)  ??  ??  ??  ??     Lactic acid  Latest Ref Range: 0.4 - 2.0 MMOL/L  ??  4.4 (HH)  2.6 (HH)  ??  ??  2.2 (HH)  2.5 (HH)     Procalcitonin  Latest Units: ng/mL  3.44  ??  ??  ??  ??  ??  ??     Troponin-I, Qt.  Latest Ref Range: 0.02 - 0.05 NG/ML  <0.02 (L)  ??  ??  ??  ??  ??  ??     Iron  Latest Ref Range: 35 - 150 ug/dL  ??  ??  68  ??  ??  ??  ??               TIBC  Latest Ref Range: 250 - 450 ug/dL  ??  ??  168 (L)  ??  ??  ??  ??               Transferrin Saturation  Latest Ref Range: >20 %  ??  ??  40  ??  ??  ??  ??     Ferritin  Latest Ref Range: 8 - 388 NG/ML  ??  ??  411 (H)  ??  ??  ??  ??     Ammonia  Latest Ref Range: 11 - 32 UMOL/L  ??  ??  ??  22  ??  ??  ??     ??       ??  Ref. Range  03/01/2019 15:00     Acetaminophen level  Latest Ref Range: 10.0 - 30.0 ug/mL  <10 (L)     ??       ??  Ref. Range  03/01/2019 12:49     AMPHETAMINES  Latest Units:    POSITIVE     BARBITURATES  Latest Units:    NEGATIVE     BENZODIAZEPINES  Latest Units:    NEGATIVE     COCAINE  Latest Units:    NEGATIVE     METHADONE  Latest Units:    NEGATIVE     OPIATES  Latest Units:    NEGATIVE     PCP(PHENCYCLIDINE)  Latest Units:    NEGATIVE     THC (TH-CANNABINOL)  Latest Units:    NEGATIVE     ??       ??  Ref. Range  03/01/2019 17:51     Occult blood, stool  Latest Ref Range: NEG    NEGATIVE     ??   HEPATITIS PANEL, ACUTE [[id number]] (Order [id number]) Lab                                        Date: 03/01/2019  Department: [facility]  Released By: Saralyn Pilar, RN (auto-released)  Authorizing: Hal Morales, MD            Component  Value  Flag  Ref Range  Units  Status     Hepatitis A Ab, IgM  Positive   Abnormal    NEGATIVE  ??  Final     Hep B surface Ag screen  NEGATIVE    ??  NEGATIVE  ??  Final     Hep B Core Ab, IgM  NEGATIVE    ??  NEGATIVE  ??  Final     Hep C  Virus Ab  0.1   ??  0.0 - 0.9  s/co ratio  Final       Comment:     (NOTE)   ?? ?? ?? ?? ?? ?? ?? ?? ?? ?? ?? ?? ?? ?? ?? ?? ??Negative: ?? ?? < 0.8   ?? ?? ?? ??  ?? ?? ?? ?? ?? ?? ?? ?? ?? ?? Indeterminate: 0.8 - 0.9   ?? ?? ?? ?? ?? ?? ?? ?? ?? ?? ?? ?? ?? ?? ?? ?? ??Positive: ?? ?? > 0.9      ??   BLOOD CULTURE ID PANEL [[id number]] (Order [id number]) Microbiology                                           Date: 03/03/2019  Department: [facility]  Released By: ??(auto-released)  Authorizing: Virgina Evener, MD        Specimen Information:  Blood     ??  ??            Component  Value  Flag  Ref Range  Units  Status     Acc. no. from Micro Order  [id number]   ??  ??  ??  Final     Staphylococcus  DETECTED   Abnormal    NOTDET  ??  Final       Comment:     RESULTS VERIFIED, PHONED TO AND READ BACK BY   Laurence Aly @ [phone] ON 03/03/2019 AK.             mecA (Methicillin-Resistance Genes)  NOT DETECTED   ??  NOTDET  ??  Final     INTERPRETATION  ??  ??  ??  ??  Final       Gram positive cocci in clusters. Identified by realtime PCR as ??Coagulase negative Staphylococci      Comment:     A single positive culture of coagulase negative Staph is likely to be a contaminant in adult patients. Consider discontinuation of antibiotics for gram  positive bloodstream infections if patient asymptomatic. ??THIS TEST DOES NOT REPLACE SENSITIVITY TESTING.     ??   ABDOMINAL ULTRASOUND.??03/01/2019??   HISTORY: Elevated liver enzymes, jaundice and abdominal pain.??   COMPARISON: None??   FINDINGS: Pancreas is grossly unremarkable although the tail is partially   obscured by overlying bowel gas.. Aorta is normal caliber, 2.6 cm. The IVC is   patent. The enlarged spleen has a homogenous echotexture and measures 23 cm.    Left kidney measures 15.1 cm included upper pole cyst calcification. No   hydronephrosis. Right kidney is unremarkable without hydronephrosis and measures   13.6 cm. Renal echogenicity is normal, the right kidney is hypoechoic to the   liver. The intrahepatic biliary tree is not dilated. There is  hepatopetal flow   in the portal vein. Small echogenic hemangioma right lobe liver, 9 x 13 mm. The   gallbladder wall is thickened, up to 8 mm. Multiple small echogenic shadowing   gallstones. The common bile duct is not dilated, 6 mm. ??   IMPRESSION   IMPRESSION: Cholelithiasis with gallbladder wall thickening, possibly acute or   chronic cholecystitis.  No biliary tree obstruction.   ??   Ceruloplasmin, haptoglobin pending 01 March 2019      ERCP Dr Salley HewsFyock 03/04/19   Findings:    Esophagus- Normal.   Stomach- Normal.   Duodenum- Normal.   Major duodenal papilla- Normal.   Common bile duct- Normal.  Narrow/ not dilated.   Intrahepatic ducts- Normal.   Pancreatic duct- Not entered.   Gallbladder- Filled.   Therapies: Sphincterotomy   Specimens: None   Estimated Blood Loss: 0 cc   Complications:   None; patient tolerated the procedure well.   Attending Attestation:  I performed the procedure.    Impression:     Normal ERCP.  Sphincterotomy performed.  No bile duct stone found.   Recommendations:   Continue supportive care for HAV??        Assessment:        Principal Problem:     Elevated liver enzymes (03/01/2019)      Active Problems:     Jaundice (03/01/2019)        CAD (coronary artery disease) (03/01/2019)        COPD (03/01/2019)        Normocytic anemia (03/01/2019)        Lactic acidosis (03/01/2019)        Pancytopenia (HCC) (03/02/2019)         58 yo male, now pt of Dr. Patty SermonsBrackbill, with PMH of CAD on Plavix, COPD, HTN, Seizures, GERD, ulcers, who was seen in consult 02 March 2019 for abnormal LFTs.  He presented with  RUQ abd pain, nausea, and was noted to have abnormal labs in the ER with T bili 4.2, direct bili 3.7, ALT 683, AST 598, AP 170, lactic acid 2.2, WBC 2.9, Hgb 9.8, Hct 29.5, plt 122, INR 1.4, BUN 11, Creat 0.94.  US noted cholelithiasis with gallbladder  wall thickening, possibly acute or chronic cholecystitis, but no biliary tree obstruction. WAs not able to perform MRI d/t claustrophobia.  ERCP yesterday negative.   LFT's stable. Continues to have abd pain but nausea improved.  Hep A Ab IgM was positive,  which may be the cause of his symptoms.Hgb is stable, with no iron def noted and hemoccult was negative.   ??     Plan:        - Surgery C/S and no urgent choledochectomy - wait until acute viarl Hep A resolves.     - Supportive care for acute Hep A   - Requests advance diet and nausea resolved so will advance as tolerated      Patient is seen and examined in collaboration with Dr. Shari Prowshristopher Fyock.  Assessment and plan as per Dr. Salley HewsFyock.   ??   Cammie Mcgeeeresa D Alonzo Owczarzak, NP-C   [facility]

## 2019-03-05 NOTE — Progress Notes (Signed)
Assisted pt with phone and outside line to call his sister.

## 2019-03-05 NOTE — Progress Notes (Signed)
Patient has discharge order.  Orders are for patient to follow up with PCP in one week.  Patient has been provided with the number to the [facility] for patient to get scheduled with [facility] to [facility].  At this time patient still has not called to get this set up.  Physician also wants patient to get established with a cardiologist.  Physician informed patient will need a referral to the cardiologist.

## 2019-03-05 NOTE — Progress Notes (Signed)
Attempted to call operator to assist pt to call [facility] with 803 area code. Was on hold several minutes and had to move on . Will attempt again later as well. Pt is asking to see CM and will let them know.

## 2019-03-05 NOTE — Progress Notes (Signed)
Vincent Smith  [address], [address]  [place] [address]  [phone]    H&P/Consult Note/Progress Note:   Vincent Smith  MRN: [id number]  DOB:02-Jun-1961  Age:58 y.o.    HPI: Vincent Smith is a 58 y.o. male who has PMHx of COPD, seizures, CAD s/p MI with stents (3-4 years ago, not on [facility]) who we are asked by GI to see for cholecystectomy.  Pt initially presented to the ED on 03/01/2019 with vague complaints of malaise, fatigue, fever, poor appetite, and chest pain. Pt was found to have lactic acidosis, pancytopenia and splenomegaly, jaundice with elevated LFTs, tbili, and Hepatitis A. He is homeless and follows with the [place].  Abd US demonstrates gallstones and thick wall gallbladder with acute vs chronic cholecystitis. GI was consulted for evaluation and ordered MRCP which pt has not been able to complete due to claustrophobia.  Pt is going for ERCP today. WBD 3, PLTs 140.  tbili 4.2, LFTs trending down.  Lactic 1.8 and trending down. Blood cultures with GPCs, pt on zosyn and vanco. General surgery was consulted for cholecystectomy.     HIV results pending.  Pt denies h/o abdominal surgery.    03/05/19: Appears confused today; calling out obscenities to someone he sees standing outside the door though no one there. tbili 4.6, alt 412, ast 376, alp 201 today. Continues to complain of abdominal pain.     Past Medical History:   Diagnosis Date   ??? CAD (coronary artery disease)    ??? COPD    ??? Gastrointestinal disorder     acid reflux and ulcers   ??? Hypertension    ??? Seizures ([place])      Past Surgical History:   Procedure Laterality Date   ??? CARDIAC SURG PROCEDURE UNLIST      2 stents     Current Facility-Administered Medications   Medication Dose Route Frequency   ??? lactated Ringers infusion  75 mL/hr IntraVENous ENDO CONTINUOUS   ??? pantoprazole (PROTONIX) tablet 40 mg  40 mg Oral DAILY   ??? acetaminophen (TYLENOL) tablet 650 mg  650 mg Oral Q6H PRN   ??? promethazine  (PHENERGAN) with saline injection 12.5 mg  12.5 mg IntraVENous Q6H PRN   ??? sodium chloride (NS) flush 5-40 mL  5-40 mL IntraVENous Q8H   ??? sodium chloride (NS) flush 5-40 mL  5-40 mL IntraVENous PRN   ??? diphenhydrAMINE (BENADRYL) capsule 25 mg  25 mg Oral Q4H PRN   ??? ondansetron (ZOFRAN) injection 4 mg  4 mg IntraVENous Q4H PRN   ??? senna-docusate (PERICOLACE) 8.6-50 mg per tablet 2 Tab  2 Tab Oral DAILY PRN   ??? heparin (porcine) injection 5,000 Units  5,000 Units SubCUTAneous Q8H   ??? albuterol-ipratropium (DUO-NEB) 2.5 MG-0.5 MG/3 ML  3 mL Nebulization Q6H PRN   ??? piperacillin-tazobactam (ZOSYN) 3.375 g in 0.9% sodium chloride (MBP/ADV) 100 mL  3.375 g IntraVENous Q8H   ??? morphine injection 1 mg  1 mg IntraVENous Q4H PRN   ??? influenza vaccine 2019-20 (6 mos+)(PF) (FLUARIX/FLULAVAL/FLUZONE QUAD) injection 0.5 mL  0.5 mL IntraMUSCular PRIOR TO DISCHARGE     Tramadol  Social History     Socioeconomic History   ??? Marital status: MARRIED     Spouse name: Not on file   ??? Number of children: Not on file   ??? Years of education: Not on file   ??? Highest education level: Not on  file   Tobacco Use   ??? Smoking status: Former Smoker     Packs/day: 2.00   ??? Smokeless tobacco: Former Systems developer     Quit date: 03/03/2013   Substance and Sexual Activity   ??? Alcohol use: No   ??? Drug use: Not Currently     Types: Marijuana     Social History     Tobacco Use   Smoking Status Former Smoker   ??? Packs/day: 2.00   Smokeless Tobacco Former User   ??? Quit date: 03/03/2013     Family History   Problem Relation Age of Onset   ??? Hypertension Mother    ??? Hypertension Father    ??? Diabetes Father      ROS: The patient has difficulty with chest pain & shortness of breath.  + fever or chills.  Comprehensive review of systems was otherwise unremarkable except as noted above.    Physical Exam:   Visit Vitals  BP 96/54   Pulse 60   Temp 97.9 ??F (36.6 ??C)   Resp 18   Ht '5\' 10"'  (1.778 m)   Wt 207 lb (93.9 kg)   SpO2 98%   BMI 29.70 kg/m??     Constitutional: Alert,  cooperative patient in no acute distress; appears stated age. Appears jaundiced.  Eyes:Sclera are icteric. EOMs intact  ENMT: no external lesions gross hearing normal; no obvious neck masses, no ear or lip lesions, nares normal  CV: RRR. Normal perfusion  Resp: No JVD.  Breathing is  non-labored; no audible wheezing.    GI: soft and mildly distended; diffuse tenderness, worse in RUQ.  Palpable splenomegaly. + umbilical hernia. No guarding.   Musculoskeletal: unremarkable with normal function. No embolic signs or cyanosis.   Neuro: yelling obscenities; moves all 4; no focal deficits  Psychiatric: agitated affect and mood, no memory impairment    Recent vitals (if inpt):  Patient Vitals for the past 24 hrs:   BP Temp Pulse Resp SpO2 Weight   03/05/19 1108 96/54 97.9 ??F (36.6 ??C) 60 18 98 % ???   03/05/19 0847 ??? ??? ??? ??? 99 % ???   03/05/19 0746 97/51 97.5 ??F (36.4 ??C) (!) 59 18 97 % ???   03/05/19 0415 138/71 97.8 ??F (36.6 ??C) 69 18 96 % ???   03/04/19 2310 93/56 98 ??F (36.7 ??C) 60 18 95 % ???   03/04/19 1943 104/65 97.7 ??F (36.5 ??C) 71 18 97 % ???   03/04/19 1650 102/54 98 ??F (36.7 ??C) 73 20 95 % 207 lb (93.9 kg)   03/04/19 1455 120/66 97.8 ??F (36.6 ??C) 79 20 97 % ???   03/04/19 1453 ??? ??? ??? 16 97 % ???   03/04/19 1450 128/68 ??? 81 16 97 % ???   03/04/19 1444 130/65 97.6 ??F (36.4 ??C) 85 16 96 % ???   03/04/19 1402 105/69 ??? 70 16 100 % ???       Labs:  Recent Labs     03/05/19  0543  03/03/19  0526   WBC 3.0*   < > 3.4*   HGB 10.5*   < > 9.9*   PLT 149*   < > 132*   NA 141   < > 142   K 5.1   < > 3.8   CL 112*   < > 112*   CO2 21   < > 22   BUN 16   < > 12   CREA 0.90   < >  0.81   GLU 116*   < > 90   PTP  --   --  15.8*   INR  --   --  1.2   APTT  --   --  41.6*   TBILI 4.6*   < > 4.4*   CBIL 4.1*   < > 3.8*   SGOT 376*   < > 491*   ALT 412*   < > 572*   AP 201*   < > 187*   TROIQ  --   --  <0.02*    < > = values in this interval not displayed.       Lab Results   Component Value Date/Time    WBC 3.0 (L) 03/05/2019 05:43 AM    HGB 10.5 (L)  03/05/2019 05:43 AM    PLATELET 149 (L) 03/05/2019 05:43 AM    Sodium 141 03/05/2019 05:43 AM    Potassium 5.1 03/05/2019 05:43 AM    Chloride 112 (H) 03/05/2019 05:43 AM    CO2 21 03/05/2019 05:43 AM    BUN 16 03/05/2019 05:43 AM    Creatinine 0.90 03/05/2019 05:43 AM    Glucose 116 (H) 03/05/2019 05:43 AM    INR 1.2 03/03/2019 05:26 AM    aPTT 41.6 (H) 03/03/2019 05:26 AM    Bilirubin, total 4.6 (H) 03/05/2019 05:43 AM    Bilirubin, direct 4.1 (H) 03/05/2019 05:43 AM    AST (SGOT) 376 (H) 03/05/2019 05:43 AM    ALT (SGPT) 412 (H) 03/05/2019 05:43 AM    Alk. phosphatase 201 (H) 03/05/2019 05:43 AM    Lipase 94 12/08/2010 12:50 AM    Ammonia 22 March 04, 2019 09:23 PM    Troponin-I <0.05 12/08/2010 03:28 AM    Troponin-I, Qt. <0.02 (L) 03/03/2019 05:26 AM       I reviewed recent labs and recent radiologic studies.  [place] Abd 03-04-19:  FINDINGS: Pancreas is grossly unremarkable although the tail is partially  obscured by overlying bowel gas.. Aorta is normal caliber, 2.6 cm. The IVC is  patent. The enlarged spleen has a homogenous echotexture and measures 23 cm.   Left kidney measures 15.1 cm included upper pole cyst calcification. No  hydronephrosis. Right kidney is unremarkable without hydronephrosis and measures  13.6 cm. Renal echogenicity is normal, the right kidney is hypoechoic to the  liver. The intrahepatic biliary tree is not dilated. There is hepatopetal flow  in the portal vein. Small echogenic hemangioma right lobe liver, 9 x 13 mm. The  gallbladder wall is thickened, up to 8 mm. Multiple small echogenic shadowing  gallstones. The common bile duct is not dilated, 6 mm.   ??  IMPRESSION  IMPRESSION: Cholelithiasis with gallbladder wall thickening, possibly acute or  chronic cholecystitis. No biliary tree obstruction.    I independently reviewed radiology images for studies I described above or studies I have ordered.   Admission date (for inpatients): March 04, 2019   * No surgery date entered *   Procedure(s):  ENDOSCOPIC RETROGRADE CHOLANGIOPANCREATOGRAPHY (ERCP)  ENDOSCOPIC SPHINCTEROTOMY  ENDOSCOPIC STONE EXTRACTION/BALLOON SWEEP    ASSESSMENT/PLAN:  Problem List  Date Reviewed: 03/04/2019          Codes Class Noted    Pancytopenia ([place]) ICD-10-CM: Q59.563  ICD-9-CM: 284.19  03/02/2019        Jaundice ICD-10-CM: R17  ICD-9-CM: 782.4  03-04-2019        CAD (coronary artery disease) ICD-10-CM: I25.10  ICD-9-CM: 414.00  2019-03-04  COPD ICD-9-CM: 681  03/01/2019        Normocytic anemia ICD-10-CM: D64.9  ICD-9-CM: 285.9  03/01/2019        Lactic acidosis ICD-10-CM: E87.2  ICD-9-CM: 276.2  03/01/2019        * (Principal) Elevated liver enzymes ICD-10-CM: R74.8  ICD-9-CM: 790.5  03/01/2019            Principal Problem:    Elevated liver enzymes (03/01/2019)    Active Problems:    Jaundice (03/01/2019)      CAD (coronary artery disease) (03/01/2019)      COPD (03/01/2019)      Normocytic anemia (03/01/2019)      Lactic acidosis (03/01/2019)      Pancytopenia ([place]) (03/02/2019)       Plan:  ERCP showed normal biliary tree and filling of gallbladder, therefore no acute cholecystitis or possible explanation of LFTs from the gallbladder.  Hep A IgM + and likely patient has acute Hepatitis A  Cholelithiasis with chronic cholecystitis would not produce these abnormalities.  Vague admission symptoms c/w viral hepatitis  It is not proper time to perform cholecystectomy, which could be done electively in the future as he recovers from Hep A  Discussed with Dr. Ralph Dowdy  Diet per GI  Pain control  Follow labs    I have personally performed a face-to-face diagnostic evaluation and management  service on this patient.    I have independently seen the patient.   I have independently obtained the above history from the patient/family.    I have independently examined the patient with above findings.  I have independently reviewed data/labs for this patient and developed the above plan of care (MDM).  Signed: Nicoletta Dress, MD, FACS

## 2019-03-05 NOTE — Discharge Summary (Signed)
Discharge Summary     Patient: Vincent Smith MRN: [id number]  SSN: [id number]    Date of Birth: 05-28-1961  Age: 58 y.o.  Sex: male       Admit Date: 03/18/2019    Discharge Date: 03/05/2019      Admission Diagnoses: Elevated liver enzymes [R74.8]  Jaundice [R17]    Discharge Diagnoses:   Problem List as of 03/05/2019 Date Reviewed: March 18, 2019          Codes Class Noted - Resolved    * (Principal) Acute hepatitis A ICD-10-CM: B15.9  ICD-9-CM: 070.1  03/05/2019 - Present        Elevated liver enzymes ICD-10-CM: R74.8  ICD-9-CM: 790.5  2019/03/18 - Present        Hyperbilirubinemia ICD-10-CM: E80.6  ICD-9-CM: 782.4  03/05/2019 - Present        Pancytopenia ([place]) ICD-10-CM: Y60.630  ICD-9-CM: 284.19  03/02/2019 - Present        Jaundice ICD-10-CM: R17  ICD-9-CM: 782.4  03/18/2019 - Present        CAD (coronary artery disease) ICD-10-CM: I25.10  ICD-9-CM: 414.00  2019/03/18 - Present        COPD ICD-9-CM: 160  Mar 18, 2019 - Present        Normocytic anemia ICD-10-CM: D64.9  ICD-9-CM: 285.9  18-Mar-2019 - Present        Lactic acidosis ICD-10-CM: E87.2  ICD-9-CM: 276.2  03-18-2019 - Present              Discharge Condition: Stable    Hospital Course:   Mr. 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  Patient mostly complaining of diffuse abdominal pain and nausea after eating.  Found to have transaminitis in the 400-600 range with minimally elevated alk phos.  Urine drug screen positive for amphetamines.  Gastroenterology was consulted out of concern for choledocholithiasis.  Patient was not able to complete MRCP yesterday due to claustrophobia despite given diazepam 5 mg.  Right upper quadrant ultrasound concerning for cholecystitis therefore general surgery was consulted but feel that this is likely indicative of chronic cholecystitis after patient had an unremarkable ERCP with normal gallbladder filling per gastroenterology.   Patient's hepatitis A virus IgM is positive.  Most likely etiology for the patient's complaints is acute HAV.  HIV pending at writing this note.  Acute hepatitis C and hepatitis B panels were both negative.  Patient was initially treated with piperacillin/tazobactam for prophylaxis against acute cholecystitis but this was discontinued after normal ERCP.  Patient was able to tolerate a diet without difficulty.  The patient worked extensively with our case managers [place] [facility] [place], however [place].  Patient discharged in stable medical condition and counseled to follow-[facility] with [facility].  Patient is not on any medications and given his history of coronary artery disease will likely need follow-up with [facility] at discharge.  Patient is also a candidate for elective outpatient cholecystectomy and it is recommended that he [facility].  Patient was not happy that he was being discharged, but did agree that he is medically stable to leave the hospital and that none of our current therapies are exclusive to the inpatient setting.  Patient was given strict return precautions and voiced agreement and understanding.    Physical Exam:   General:  Middle-aged white male, lying in bed, no acute distress  HEENT: NCAT, moist mucous membranes  Skin: No rash noted  Cardio: RRR, normal S1/S2, no rubs, no gallops, no murmurs  Pulm: Non labored respirations on room air, LCAB, no wheezing, no rales, no rhonchi  GI: Soft, Nt, Nd, Nml bowel sounds, no masses noted  Extremity: Atraumatic, no deformities, no edema  Neuro: Alert, oriented, moving all extremities, no focal deficits noted  Psych: Pleasant, cooperative, normal range of affect    Consults: Gastroenterology and General Surgery    Significant Diagnostic Studies:    ABDOMINAL ULTRASOUND.  ??  HISTORY: Elevated liver enzymes, jaundice and abdominal  pain.  ??  COMPARISON: None  ??  FINDINGS: Pancreas is grossly unremarkable although the tail is partially  obscured by overlying bowel gas.. Aorta is normal caliber, 2.6 cm. The IVC is  patent. The enlarged spleen has a homogenous echotexture and measures 23 cm.   Left kidney measures 15.1 cm included upper pole cyst calcification. No  hydronephrosis. Right kidney is unremarkable without hydronephrosis and measures  13.6 cm. Renal echogenicity is normal, the right kidney is hypoechoic to the  liver. The intrahepatic biliary tree is not dilated. There is hepatopetal flow  in the portal vein. Small echogenic hemangioma right lobe liver, 9 x 13 mm. The  gallbladder wall is thickened, up to 8 mm. Multiple small echogenic shadowing  gallstones. The common bile duct is not dilated, 6 mm.   ??  IMPRESSION  IMPRESSION: Cholelithiasis with gallbladder wall thickening, possibly acute or  chronic cholecystitis. No biliary tree obstruction.    CT HEAD WITHOUT CONTRAST.  ??  INDICATION: Headache and fall.  ??  COMPARISON: None.    ??  TECHNIQUE:   5 mm axial scans from the skull base to the vertex.  Our CT  scanners use one or more of the following:  Automated exposure control,  adjustment of the mA and or kV according to patient size, iterative  reconstruction.  ??  FINDINGS:  No acute intraparenchymal hemorrhage or abnormal extra-axial fluid  collection.  The ventricles are normal size.  No midline shift or mass effect.   Included portion of the paranasal sinuses and orbits grossly unremarkable.  ??  IMPRESSION  IMPRESSION:  Negative for acute intracranial abnormality.     ERCP images  ??  History: OR 1, 58 years Male ENDO RM 1 DR [facility] ERCP SEE EXAM REPORT FOR MORE  INFO  FLUOR TIME 2 MIN 16 SEC, 5 images saved  ??  Comparison: Abdominal ultrasound March 01, 2019  ??  Findings:  ERCP was performed by gastroenterology service.  Images are  interpreted here.  Please see dedicated report by GI service for complete  details.  ERCP images  demonstrate normal-appearing intrahepatic and extrahepatic  biliary ducts, without evidence of filling defect to suggest  choledocholithiasis.  Balloon papillotomy was performed.  ??  IMPRESSION  Impression: ERCP images as above.  ??  Fluoroscopy time: 2:16 min.  ??  Total number of fluoro images obtained: 5    Disposition: Self-care    Discharge Medications:   Current Discharge Medication List      STOP taking these medications       clopidogrel (PLAVIX) 75 mg tablet Comments:   Reason for Stopping:         nitroglycerin (NITROSTAT) 0.4 mg SL tablet Comments:   Reason for Stopping:         IPRATROPIUM/ALBUTEROL SULFATE (COMBIVENT IN) Comments:   Reason  for Stopping:         HYDROcodone-acetaminophen (NORCO) 10-325 mg tablet Comments:   Reason for Stopping:         OTHER Comments:   Reason for Stopping:         OTHER Comments:   Reason for Stopping:               Activity: Activity as tolerated  Diet: Cardiac Diet  Wound Care: None needed    Follow-up Appointments   Procedures   ??? FOLLOW UP VISIT Appointment in: Other (Specify) Establish care with PCP for chronic medical problems. Needs to establish with cardiology for coronary artery disease. See PCP in 1 week.     Establish care with PCP for chronic medical problems. Needs to establish with cardiology for coronary artery disease. See PCP in 1 week.     Standing Status:   Standing     Number of Occurrences:   1     Order Specific Question:   Appointment in     Answer:   Other (Specify)       Signed By: Otilio Miu, MD     March 05, 2019

## 2019-03-05 NOTE — Progress Notes (Signed)
CM made several attempts to find patient family.  Patient does not have any phone numbers for friends or family as he stated he lost his phone.  CM googled patient family names and could not find any phone numbers for them.  [facility] with the [facility] who told them the vouchers are on a waiting list, patient then hung up on them.  [facility] called CM and stated they would try to call him again to see if he is in a better mood.  Patient told CM the only way he is able to get into touch with family is through [employer].  CM tried to find a hospital computer but all the computers have Facebook blocked.   Patient made aware.

## 2019-03-05 NOTE — Progress Notes (Signed)
CM assisted patient with calling the [facility] for homeless assistance.  CM left message for Mindi [employer] to call patient.  CM informed patient to answer the telephone.

## 2019-03-06 LAB — COMPREHENSIVE METABOLIC PANEL
ALT: 409 U/L — ABNORMAL HIGH (ref 12–65)
ALT: 369 U/L — ABNORMAL HIGH (ref 12–65)
AST: 411 U/L — ABNORMAL HIGH (ref 15–37)
AST: 384 U/L — ABNORMAL HIGH (ref 15–37)
Albumin/Globulin Ratio: 0.4 — ABNORMAL LOW (ref 1.2–3.5)
Albumin/Globulin Ratio: 0.4 — ABNORMAL LOW (ref 1.2–3.5)
Albumin: 1.8 g/dL — ABNORMAL LOW (ref 3.5–5.0)
Albumin: 1.7 g/dL — ABNORMAL LOW (ref 3.5–5.0)
Alkaline Phosphatase: 239 U/L — ABNORMAL HIGH (ref 50–136)
Alkaline Phosphatase: 220 U/L — ABNORMAL HIGH (ref 50–136)
Anion Gap: 7 mmol/L (ref 7–16)
Anion Gap: 5 mmol/L — ABNORMAL LOW (ref 7–16)
BUN: 14 MG/DL (ref 6–23)
BUN: 13 MG/DL (ref 6–23)
CO2: 23 mmol/L (ref 21–32)
CO2: 24 mmol/L (ref 21–32)
Calcium: 8.2 MG/DL — ABNORMAL LOW (ref 8.3–10.4)
Calcium: 7.7 MG/DL — ABNORMAL LOW (ref 8.3–10.4)
Chloride: 112 mmol/L — ABNORMAL HIGH (ref 98–107)
Chloride: 115 mmol/L — ABNORMAL HIGH (ref 98–107)
Creatinine: 0.96 MG/DL (ref 0.8–1.5)
Creatinine: 0.87 MG/DL (ref 0.8–1.5)
EGFR IF NonAfrican American: >60 mL/min/{1.73_m2} (ref >60)
EGFR IF NonAfrican American: >60 mL/min/{1.73_m2} (ref >60)
GFR African American: >60 mL/min/{1.73_m2} (ref >60)
GFR African American: >60 mL/min/{1.73_m2} (ref >60)
Globulin: 5 g/dL — ABNORMAL HIGH (ref 2.3–3.5)
Globulin: 4.3 g/dL — ABNORMAL HIGH (ref 2.3–3.5)
Glucose: 97 mg/dL (ref 65–100)
Glucose: 81 mg/dL (ref 65–100)
Potassium: 4 mmol/L (ref 3.5–5.1)
Potassium: 4.2 mmol/L (ref 3.5–5.1)
Sodium: 142 mmol/L (ref 136–145)
Sodium: 144 mmol/L (ref 136–145)
Total Bilirubin: 5.1 MG/DL — ABNORMAL HIGH (ref 0.2–1.1)
Total Bilirubin: 4.6 MG/DL — ABNORMAL HIGH (ref 0.2–1.1)
Total Protein: 6.8 g/dL (ref 6.3–8.2)
Total Protein: 6 g/dL — ABNORMAL LOW (ref 6.3–8.2)

## 2019-03-06 LAB — CBC WITH AUTO DIFFERENTIAL
Basophils %: 0 % (ref 0.0–2.0)
Basophils %: 0 % (ref 0.0–2.0)
Basophils Absolute: 0 10*3/uL (ref 0.0–0.2)
Basophils Absolute: 0 10*3/uL (ref 0.0–0.2)
Eosinophils %: 1 % (ref 0.5–7.8)
Eosinophils %: 2 % (ref 0.5–7.8)
Eosinophils Absolute: 0 10*3/uL (ref 0.0–0.8)
Eosinophils Absolute: 0.1 10*3/uL (ref 0.0–0.8)
Granulocyte Absolute Count: 0.1 10*3/uL (ref 0.0–0.5)
Granulocyte Absolute Count: 0.1 10*3/uL (ref 0.0–0.5)
Hematocrit: 34.4 % — ABNORMAL LOW (ref 41.1–50.3)
Hematocrit: 32.5 % — ABNORMAL LOW (ref 41.1–50.3)
Hemoglobin: 11.2 g/dL — ABNORMAL LOW (ref 13.6–17.2)
Hemoglobin: 10.5 g/dL — ABNORMAL LOW (ref 13.6–17.2)
Immature Granulocytes: 2 % (ref 0.0–5.0)
Immature Granulocytes: 1 % (ref 0.0–5.0)
Lymphocytes %: 18 % (ref 13–44)
Lymphocytes %: 24 % (ref 13–44)
Lymphocytes Absolute: 1.1 10*3/uL (ref 0.5–4.6)
Lymphocytes Absolute: 1 10*3/uL (ref 0.5–4.6)
MCH: 30.3 PG (ref 26.1–32.9)
MCH: 30.8 PG (ref 26.1–32.9)
MCHC: 32.6 g/dL (ref 31.4–35.0)
MCHC: 32.3 g/dL (ref 31.4–35.0)
MCV: 93 FL (ref 79.6–97.8)
MCV: 95.3 FL (ref 79.6–97.8)
MPV: 11.9 FL (ref 9.4–12.3)
MPV: 11.7 FL (ref 9.4–12.3)
Monocytes %: 8 % (ref 4.0–12.0)
Monocytes %: 8 % (ref 4.0–12.0)
Monocytes Absolute: 0.5 10*3/uL (ref 0.1–1.3)
Monocytes Absolute: 0.4 10*3/uL (ref 0.1–1.3)
NRBC Absolute: 0 10*3/uL (ref 0.0–0.2)
NRBC Absolute: 0 10*3/uL (ref 0.0–0.2)
Neutrophils %: 72 % (ref 43–78)
Neutrophils %: 65 % (ref 43–78)
Neutrophils Absolute: 4.3 10*3/uL (ref 1.7–8.2)
Neutrophils Absolute: 2.7 10*3/uL (ref 1.7–8.2)
Platelets: 213 10*3/uL (ref 150–450)
Platelets: 165 10*3/uL (ref 150–450)
RBC: 3.7 M/uL — ABNORMAL LOW (ref 4.23–5.6)
RBC: 3.41 M/uL — ABNORMAL LOW (ref 4.23–5.6)
RDW: 15.9 % — ABNORMAL HIGH (ref 11.9–14.6)
RDW: 16.3 % — ABNORMAL HIGH (ref 11.9–14.6)
WBC: 6 10*3/uL (ref 4.3–11.1)
WBC: 4.2 10*3/uL — ABNORMAL LOW (ref 4.3–11.1)

## 2019-03-06 LAB — HIV 1/2 ANTIGEN/ANTIBODY, FOURTH GENERATION W/RFL: HIV Scr 4th Gen: REACTIVE — AB

## 2019-03-06 LAB — HIV-1 RNA, QL
HIV 1 RNA, QL: NEGATIVE
HIV 1 RNA, Ql: NEGATIVE

## 2019-03-06 LAB — HIV 1,2 AB CONFIRM
HIV-1 Ab: NEGATIVE
HIV-1 Ab: NEGATIVE
HIV-2 Ab: NEGATIVE
HIV-2 Ab: NEGATIVE
Note: NEGATIVE
Note: NEGATIVE

## 2019-03-06 LAB — LIPASE
Lipase: 900 U/L — ABNORMAL HIGH (ref 73–393)
Lipase: 900 U/L — ABNORMAL HIGH (ref 73–393)

## 2019-03-06 LAB — CULTURE, BLOOD 1: Culture: NO GROWTH

## 2019-03-06 LAB — METABOLIC PANEL, COMPREHENSIVE
A-G Ratio: 0.4 — ABNORMAL LOW (ref 1.2–3.5)
A-G Ratio: 0.4 — ABNORMAL LOW (ref 1.2–3.5)
ALT (SGPT): 409 U/L — ABNORMAL HIGH (ref 12–65)
ALT (SGPT): 369 U/L — ABNORMAL HIGH (ref 12–65)
AST (SGOT): 411 U/L — ABNORMAL HIGH (ref 15–37)
AST (SGOT): 384 U/L — ABNORMAL HIGH (ref 15–37)
Albumin: 1.8 g/dL — ABNORMAL LOW (ref 3.5–5.0)
Albumin: 1.7 g/dL — ABNORMAL LOW (ref 3.5–5.0)
Alk. phosphatase: 239 U/L — ABNORMAL HIGH (ref 50–136)
Alk. phosphatase: 220 U/L — ABNORMAL HIGH (ref 50–136)
Anion gap: 7 mmol/L (ref 7–16)
Anion gap: 5 mmol/L — ABNORMAL LOW (ref 7–16)
BUN: 14 MG/DL (ref 6–23)
BUN: 13 MG/DL (ref 6–23)
Bilirubin, total: 5.1 MG/DL — ABNORMAL HIGH (ref 0.2–1.1)
Bilirubin, total: 4.6 MG/DL — ABNORMAL HIGH (ref 0.2–1.1)
CO2: 23 mmol/L (ref 21–32)
CO2: 24 mmol/L (ref 21–32)
Calcium: 8.2 MG/DL — ABNORMAL LOW (ref 8.3–10.4)
Calcium: 7.7 MG/DL — ABNORMAL LOW (ref 8.3–10.4)
Chloride: 112 mmol/L — ABNORMAL HIGH (ref 98–107)
Chloride: 115 mmol/L — ABNORMAL HIGH (ref 98–107)
Creatinine: 0.96 MG/DL (ref 0.8–1.5)
Creatinine: 0.87 MG/DL (ref 0.8–1.5)
GFR est AA: >60 mL/min/{1.73_m2} (ref >60)
GFR est AA: >60 mL/min/{1.73_m2} (ref >60)
GFR est non-AA: >60 mL/min/{1.73_m2} (ref >60)
GFR est non-AA: >60 mL/min/{1.73_m2} (ref >60)
Globulin: 5 g/dL — ABNORMAL HIGH (ref 2.3–3.5)
Globulin: 4.3 g/dL — ABNORMAL HIGH (ref 2.3–3.5)
Glucose: 97 mg/dL (ref 65–100)
Glucose: 81 mg/dL (ref 65–100)
Potassium: 4 mmol/L (ref 3.5–5.1)
Potassium: 4.2 mmol/L (ref 3.5–5.1)
Protein, total: 6.8 g/dL (ref 6.3–8.2)
Protein, total: 6 g/dL — ABNORMAL LOW (ref 6.3–8.2)
Sodium: 142 mmol/L (ref 136–145)
Sodium: 144 mmol/L (ref 136–145)

## 2019-03-06 LAB — CBC WITH AUTOMATED DIFF
ABS. BASOPHILS: 0 10*3/uL (ref 0.0–0.2)
ABS. BASOPHILS: 0 10*3/uL (ref 0.0–0.2)
ABS. EOSINOPHILS: 0 10*3/uL (ref 0.0–0.8)
ABS. EOSINOPHILS: 0.1 10*3/uL (ref 0.0–0.8)
ABS. IMM. GRANS.: 0.1 10*3/uL (ref 0.0–0.5)
ABS. IMM. GRANS.: 0.1 10*3/uL (ref 0.0–0.5)
ABS. LYMPHOCYTES: 1.1 10*3/uL (ref 0.5–4.6)
ABS. LYMPHOCYTES: 1 10*3/uL (ref 0.5–4.6)
ABS. MONOCYTES: 0.5 10*3/uL (ref 0.1–1.3)
ABS. MONOCYTES: 0.4 10*3/uL (ref 0.1–1.3)
ABS. NEUTROPHILS: 4.3 10*3/uL (ref 1.7–8.2)
ABS. NEUTROPHILS: 2.7 10*3/uL (ref 1.7–8.2)
ABSOLUTE NRBC: 0 10*3/uL (ref 0.0–0.2)
ABSOLUTE NRBC: 0 10*3/uL (ref 0.0–0.2)
BASOPHILS: 0 % (ref 0.0–2.0)
BASOPHILS: 0 % (ref 0.0–2.0)
EOSINOPHILS: 1 % (ref 0.5–7.8)
EOSINOPHILS: 2 % (ref 0.5–7.8)
HCT: 34.4 % — ABNORMAL LOW (ref 41.1–50.3)
HCT: 32.5 % — ABNORMAL LOW (ref 41.1–50.3)
HGB: 11.2 g/dL — ABNORMAL LOW (ref 13.6–17.2)
HGB: 10.5 g/dL — ABNORMAL LOW (ref 13.6–17.2)
IMMATURE GRANULOCYTES: 2 % (ref 0.0–5.0)
IMMATURE GRANULOCYTES: 1 % (ref 0.0–5.0)
LYMPHOCYTES: 18 % (ref 13–44)
LYMPHOCYTES: 24 % (ref 13–44)
MCH: 30.3 PG (ref 26.1–32.9)
MCH: 30.8 PG (ref 26.1–32.9)
MCHC: 32.6 g/dL (ref 31.4–35.0)
MCHC: 32.3 g/dL (ref 31.4–35.0)
MCV: 93 FL (ref 79.6–97.8)
MCV: 95.3 FL (ref 79.6–97.8)
MONOCYTES: 8 % (ref 4.0–12.0)
MONOCYTES: 8 % (ref 4.0–12.0)
MPV: 11.9 FL (ref 9.4–12.3)
MPV: 11.7 FL (ref 9.4–12.3)
NEUTROPHILS: 72 % (ref 43–78)
NEUTROPHILS: 65 % (ref 43–78)
PLATELET: 213 10*3/uL (ref 150–450)
PLATELET: 165 10*3/uL (ref 150–450)
RBC: 3.7 M/uL — ABNORMAL LOW (ref 4.23–5.6)
RBC: 3.41 M/uL — ABNORMAL LOW (ref 4.23–5.6)
RDW: 15.9 % — ABNORMAL HIGH (ref 11.9–14.6)
RDW: 16.3 % — ABNORMAL HIGH (ref 11.9–14.6)
WBC: 6 10*3/uL (ref 4.3–11.1)
WBC: 4.2 10*3/uL — ABNORMAL LOW (ref 4.3–11.1)

## 2019-03-06 LAB — CULTURE, BLOOD: Culture result:: NO GROWTH

## 2019-03-06 LAB — HIV 1/2 AG/AB, 4TH GENERATION,W RFLX CONFIRM: HIV Screen, 4th gen: REACTIVE — AB

## 2019-03-06 MED ORDER — HYDROMORPHONE (PF) 1 MG/ML IJ SOLN
1 mg/mL | INTRAMUSCULAR | Status: AC
Start: 2019-03-06 — End: 2019-03-05
  Administered 2019-03-06: 04:00:00 via INTRAVENOUS

## 2019-03-06 MED ORDER — SODIUM CHLORIDE 0.9 % IV
INTRAVENOUS | Status: DC
Start: 2019-03-06 — End: 2019-03-06
  Administered 2019-03-06: 04:00:00 via INTRAVENOUS

## 2019-03-06 MED ORDER — SODIUM CHLORIDE 0.9% BOLUS IV
0.9 % | Freq: Once | INTRAVENOUS | Status: AC
Start: 2019-03-06 — End: 2019-03-06
  Administered 2019-03-06: 04:00:00 via INTRAVENOUS

## 2019-03-06 MED ORDER — ONDANSETRON (PF) 4 MG/2 ML INJECTION
4 mg/2 mL | INTRAMUSCULAR | Status: AC
Start: 2019-03-06 — End: 2019-03-05
  Administered 2019-03-06: 04:00:00 via INTRAVENOUS

## 2019-03-06 MED FILL — HYDROMORPHONE (PF) 1 MG/ML IJ SOLN: 1 mg/mL | INTRAMUSCULAR | Qty: 1

## 2019-03-06 MED FILL — ONDANSETRON (PF) 4 MG/2 ML INJECTION: 4 mg/2 mL | INTRAMUSCULAR | Qty: 2

## 2019-03-06 NOTE — ED Notes (Signed)
Pt d/c'd to waiting room with two tickets for the bus.

## 2019-03-06 NOTE — ED Notes (Signed)
I have reviewed discharge instructions with the patient.  The patient verbalized understanding.    Patient left ED via Discharge Method: ambulatory to Home with self  Opportunity for questions and clarification provided.       Patient given 0 scripts. No e-sign.  Refused d/c vitals.        To continue your aftercare when you leave the hospital, you may receive an automated call from our care team to check in on how you are doing.  This is a free service and part of our promise to provide the best care and service to meet your aftercare needs.??? If you have questions, or wish to unsubscribe from this service please call [phone].  Thank you for Choosing our [place] [facility].

## 2019-03-06 NOTE — Progress Notes (Signed)
Met with patient in ER lobby per request / patient currently homeless / is married, states they are separated but on good terms.  Patient was given VA information prior to discharging from 6th floor / CM spoke with 6th floor case worker and she states patient was given information to call VA for possible bed but kept postponing and then when he did call, the bed was gone.  Patient called a couple of numbers of people he knows without success / called VA, still no bed / called shelter, no bed.  Patient asks to be taken to wife's home / yellow cab contacted to pick up patient from ER.

## 2019-03-06 NOTE — ED Notes (Signed)
7:27 AM  This patient was discharged yesterday from the hospital.  Dr. Barthel who was the admitting doctor has reviewed the chart.  He feels that the labs are consistent with his previous labs at the time of discharge.  The patient was cleared for discharge by GI and surgery.  He was apparently noncompliant on the floor, discharged home without a place to stay last night because he had not made phone calls to get temporary VA housing until too late.  Hospitalist service does not feel that the patient requires admission at this time.  He is hemodynamically stable, labs are stable.  They have recommended that he be evaluated by case management and discharge from the emergency department.    Dispo: dc to home  Dx: hepatitis A

## 2019-03-06 NOTE — ED Notes (Signed)
7:27 AM  This patient was discharged yesterday from the hospital.  Dr. Doran Clay who was the admitting doctor has reviewed the chart.  He feels that the labs are consistent with his previous labs at the time of discharge.  The patient was cleared for discharge by GI and surgery.  He was apparently noncompliant on the floor, discharged home without a place to stay last night because he had not made phone calls to get temporary [facility] housing until too late.  Hospitalist service does not feel that the patient requires admission at this time.  He is hemodynamically stable, labs are stable.  They have recommended that he be evaluated by case management and discharge from the emergency department.    Dispo: dc to home  Dx: hepatitis A

## 2019-03-06 NOTE — Progress Notes (Signed)
Met with patient in ER lobby per request / patient currently homeless / is married, states they are separated but on good terms.  Patient was given VA information prior to discharging from 6th floor / CM spoke with 6th floor case worker and she states patient was given information to call VA for possible bed but kept postponing and then when he did call, the bed was gone.  Patient called a couple of numbers of people he knows without success / called VA, still no bed / called shelter, no bed.  Patient asks to be taken to wife's home / yellow cab contacted to pick up patient from ER.

## 2019-03-06 NOTE — ED Notes (Signed)
I have reviewed discharge instructions with the patient.  The patient verbalized understanding.    Patient left ED via Discharge Method: ambulatory to Home with self  Opportunity for questions and clarification provided.       Patient given 0 scripts. No e-sign.  Refused d/c vitals.        To continue your aftercare when you leave the hospital, you may receive an automated call from our care team to check in on how you are doing.  This is a free service and part of our promise to provide the best care and service to meet your aftercare needs." If you have questions, or wish to unsubscribe from this service please call [phone].  Thank you for Choosing our [facility] Emergency Department.

## 2019-03-06 NOTE — ED Notes (Signed)
Pt d/c'd to waiting room with two tickets for the bus.

## 2019-03-08 LAB — CULTURE, BLOOD 1
Culture: NO GROWTH
Culture: NO GROWTH

## 2019-03-08 LAB — CULTURE, BLOOD
Culture result:: NO GROWTH
Culture result:: NO GROWTH

## 2019-04-13 ENCOUNTER — Inpatient Hospital Stay (HOSPITAL_COMMUNITY): Payer: Self-pay

## 2019-04-13 ENCOUNTER — Inpatient Hospital Stay (HOSPITAL_COMMUNITY)
Admission: AD | Admit: 2019-04-13 | Discharge: 2019-04-18 | DRG: 917 | Disposition: A | Discharge status: Other Acute Inpatient Hospital | Payer: No Typology Code available for payment source | Source: Other Acute Inpatient Hospital | Attending: Internal Medicine | Admitting: Internal Medicine

## 2019-04-13 DIAGNOSIS — G9341 Metabolic encephalopathy: Secondary | ICD-10-CM | POA: Diagnosis not present

## 2019-04-13 DIAGNOSIS — T481X5A Adverse effect of skeletal muscle relaxants [neuromuscular blocking agents], initial encounter: Secondary | ICD-10-CM | POA: Diagnosis present

## 2019-04-13 DIAGNOSIS — F1010 Alcohol abuse, uncomplicated: Secondary | ICD-10-CM | POA: Diagnosis present

## 2019-04-13 DIAGNOSIS — F329 Major depressive disorder, single episode, unspecified: Secondary | ICD-10-CM | POA: Diagnosis present

## 2019-04-13 DIAGNOSIS — D649 Anemia, unspecified: Secondary | ICD-10-CM | POA: Diagnosis present

## 2019-04-13 DIAGNOSIS — J449 Chronic obstructive pulmonary disease, unspecified: Secondary | ICD-10-CM | POA: Diagnosis present

## 2019-04-13 DIAGNOSIS — E872 Acidosis: Secondary | ICD-10-CM | POA: Diagnosis present

## 2019-04-13 DIAGNOSIS — F322 Major depressive disorder, single episode, severe without psychotic features: Secondary | ICD-10-CM

## 2019-04-13 DIAGNOSIS — Z888 Allergy status to other drugs, medicaments and biological substances status: Secondary | ICD-10-CM

## 2019-04-13 DIAGNOSIS — G92 Toxic encephalopathy: Secondary | ICD-10-CM | POA: Diagnosis present

## 2019-04-13 DIAGNOSIS — Z978 Presence of other specified devices: Secondary | ICD-10-CM

## 2019-04-13 DIAGNOSIS — Z4659 Encounter for fitting and adjustment of other gastrointestinal appliance and device: Secondary | ICD-10-CM

## 2019-04-13 DIAGNOSIS — Z79899 Other long term (current) drug therapy: Secondary | ICD-10-CM

## 2019-04-13 DIAGNOSIS — J9600 Acute respiratory failure, unspecified whether with hypoxia or hypercapnia: Secondary | ICD-10-CM | POA: Diagnosis present

## 2019-04-13 DIAGNOSIS — Z86711 Personal history of pulmonary embolism: Secondary | ICD-10-CM

## 2019-04-13 DIAGNOSIS — E878 Other disorders of electrolyte and fluid balance, not elsewhere classified: Secondary | ICD-10-CM | POA: Diagnosis present

## 2019-04-13 DIAGNOSIS — K7030 Alcoholic cirrhosis of liver without ascites: Secondary | ICD-10-CM | POA: Diagnosis present

## 2019-04-13 DIAGNOSIS — T481X2A Poisoning by skeletal muscle relaxants [neuromuscular blocking agents], intentional self-harm, initial encounter: Principal | ICD-10-CM | POA: Diagnosis present

## 2019-04-13 LAB — POCT I-STAT 7, (LYTES, BLD GAS, ICA,H+H)
Acid-base deficit: 3 mmol/L — ABNORMAL HIGH (ref 0.0–2.0)
Bicarbonate: 20.7 mmol/L (ref 20.0–28.0)
Calcium, Ion: 1.26 mmol/L (ref 1.15–1.40)
HCT: 26 % — ABNORMAL LOW (ref 39.0–52.0)
Hemoglobin: 8.8 g/dL — ABNORMAL LOW (ref 13.0–17.0)
O2 Saturation: 100 %
Patient temperature: 98.6
Potassium: 4 mmol/L (ref 3.5–5.1)
Sodium: 143 mmol/L (ref 135–145)
TCO2: 22 mmol/L (ref 22–32)
pCO2 arterial: 29.8 mmHg — ABNORMAL LOW (ref 32.0–48.0)
pH, Arterial: 7.449 (ref 7.350–7.450)
pO2, Arterial: 242 mmHg — ABNORMAL HIGH (ref 83.0–108.0)

## 2019-04-13 LAB — GLUCOSE, CAPILLARY
Glucose-Capillary: 101 mg/dL — ABNORMAL HIGH (ref 70–99)
Glucose-Capillary: 100 mg/dL — ABNORMAL HIGH (ref 70–99)
Glucose-Capillary: 89 mg/dL (ref 70–99)

## 2019-04-13 LAB — BASIC METABOLIC PANEL
Anion gap: 9 (ref 5–15)
BUN: 17 mg/dL (ref 6–20)
CO2: 19 mmol/L — ABNORMAL LOW (ref 22–32)
Calcium: 8.9 mg/dL (ref 8.9–10.3)
Chloride: 112 mmol/L — ABNORMAL HIGH (ref 98–111)
Creatinine, Ser: 0.73 mg/dL (ref 0.61–1.24)
GFR calc Af Amer: >60 mL/min (ref >60)
GFR calc non Af Amer: >60 mL/min (ref >60)
Glucose, Bld: 111 mg/dL — ABNORMAL HIGH (ref 70–99)
Potassium: 4 mmol/L (ref 3.5–5.1)
Sodium: 140 mmol/L (ref 135–145)

## 2019-04-13 LAB — PROTIME-INR
INR: 1.1 (ref 0.8–1.2)
Prothrombin Time: 14.4 seconds (ref 11.4–15.2)

## 2019-04-13 LAB — CK TOTAL AND CKMB (NOT AT ARMC)
CK, MB: 1.7 ng/mL (ref 0.5–5.0)
Relative Index: INVALID (ref 0.0–2.5)
Total CK: 13 U/L — ABNORMAL LOW (ref 49–397)

## 2019-04-13 LAB — PHOSPHORUS: Phosphorus: 4.8 mg/dL — ABNORMAL HIGH (ref 2.5–4.6)

## 2019-04-13 LAB — MAGNESIUM: Magnesium: 2 mg/dL (ref 1.7–2.4)

## 2019-04-13 LAB — LACTIC ACID, PLASMA
Lactic Acid, Venous: 1.7 mmol/L (ref 0.5–1.9)
Lactic Acid, Venous: 1 mmol/L (ref 0.5–1.9)

## 2019-04-13 LAB — TRIGLYCERIDES: Triglycerides: 88 mg/dL (ref <150)

## 2019-04-13 LAB — LIPASE, BLOOD: Lipase: 29 U/L (ref 11–51)

## 2019-04-13 MED ORDER — FENTANYL CITRATE (PF) 100 MCG/2ML IJ SOLN: 50.0000 ug | INTRAMUSCULAR | Status: DC | PRN

## 2019-04-13 MED ORDER — FENTANYL CITRATE (PF) 100 MCG/2ML IJ SOLN
50.0000 ug | INTRAMUSCULAR | Status: DC | PRN
  Administered 2019-04-16: 100 ug via INTRAVENOUS
  Filled 2019-04-13: qty 2

## 2019-04-13 MED ORDER — LACTULOSE 10 GM/15ML PO SOLN
20.0000 g | Freq: Three times a day (TID) | ORAL | Status: DC
  Administered 2019-04-13 – 2019-04-16 (×10): 20 g
  Filled 2019-04-13 (×11): qty 30

## 2019-04-13 MED ORDER — MIDAZOLAM HCL 2 MG/2ML IJ SOLN
1.0000 mg | INTRAMUSCULAR | Status: DC | PRN
  Administered 2019-04-13 – 2019-04-15 (×4): 1 mg via INTRAVENOUS
  Filled 2019-04-13 (×5): qty 2

## 2019-04-13 MED ORDER — CHLORHEXIDINE GLUCONATE 0.12% ORAL RINSE (MEDLINE KIT)
15.0000 mL | Freq: Two times a day (BID) | OROMUCOSAL | Status: DC
  Administered 2019-04-13 – 2019-04-14 (×2): 15 mL via OROMUCOSAL

## 2019-04-13 MED ORDER — LACTATED RINGERS IV SOLN
INTRAVENOUS | Status: AC
  Administered 2019-04-13 – 2019-04-15 (×5): via INTRAVENOUS

## 2019-04-13 MED ORDER — PANTOPRAZOLE SODIUM 40 MG IV SOLR
40.0000 mg | Freq: Every day | INTRAVENOUS | Status: DC
  Administered 2019-04-13: 22:00:00 40 mg via INTRAVENOUS
  Filled 2019-04-13: qty 40

## 2019-04-13 MED ORDER — PROPOFOL 1000 MG/100ML IV EMUL
5.0000 ug/kg/min | INTRAVENOUS | Status: DC
  Administered 2019-04-13 (×2): 40 ug/kg/min via INTRAVENOUS
  Administered 2019-04-14: 50 ug/kg/min via INTRAVENOUS
  Administered 2019-04-14: 35 ug/kg/min via INTRAVENOUS
  Administered 2019-04-14: 50 ug/kg/min via INTRAVENOUS
  Filled 2019-04-13 (×5): qty 100

## 2019-04-13 MED ORDER — ORAL CARE MOUTH RINSE
15.0000 mL | OROMUCOSAL | Status: DC
  Administered 2019-04-13 – 2019-04-14 (×8): 15 mL via OROMUCOSAL

## 2019-04-13 NOTE — H&P (Addendum)
NAME:  Taylor PorterRobert Spratt, MRN:  [id number], DOB:  Jul 15, 1961, LOS: 0 ADMISSION DATE:  (Not on file), CONSULTATION DATE:  4/19 REFERRING MD:  [facility] , CHIEF COMPLAINT:  Acute toxic and metabolic encephalopathy and respiratory failure    Brief History   58 year old male found by family member w/ acute mental status change and empty flexeril bottle next to him. Was intubated in ER and [facility] and treatment.   History of present illness   This is a 58 year old male who presented to [place] ER after being found with altered mental status. On ER eval had rapid pupillary changes (dilating and constricting), fluctuant mental status between awake and unresponsive, apparently takes baclofen, and gabapentin and was found w/ empty bottle of flexeril near him on EMS arrival In ER he was intubated.  Per emergency room report was found in vomit. Dx eval in ER  CT brain neg + metabolic acidosis, CBC was unremarkable mild metabolic acidosis with anion gap of 13, lactic acid of 1.8 mildly elevated chloride otherwise normal blood chemistry.  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  Recently worked up for syncope.  Prior pulmonary emboli, COPD.,  Hepatitis a, portal hypertension.  Significant Hospital Events   4/19: admitted in transfer [facility] after being found altered   Consults:    Procedures:  oett 4/19>>>  Significant Diagnostic Tests:  CT brain 4/19: Negative for acute intracranial abnormality CT C-spine: Negative for acute cervical spine fracture or subluxation EEG 4/19>>> Micro Data:  BCX2 4/19>>> UC 4/19>>>  Antimicrobials:    Interim history/subjective:  Now in intensive care s/p transport.   Objective   There were no vitals taken for this visit. PAP: ()/()      No intake or output data in the 24 hours ending 04/13/19 1157 There were no vitals filed for this visit.  Examination:  General: 58 year old white male he is currently sedated on the ventilator with the propofol infusion HENT: Normocephalic atraumatic sclera are nonicteric there is no scleral edema pupils are equal and reactive he is orally intubated mucous membranes are moist and NG tube is in place Lungs: Scattered rhonchi, equal chest rise on mechanically assisted ventilation he is overbreathing the set rate Cardiovascular: Regular rate and rhythm without audible murmur rub or gallop Abdomen: Soft, positive bowel sounds.  No organomegaly appreciated Extremities: Warm and dry brisk capillary refill no edema Neuro: Cough and gag present on current level of sedation moving extremities not following commands as of yet with current sedation GU: Concentrated yellow urine  Resolved Hospital Problem list     Assessment & Plan:  Acute toxic and metabolic encephalopathy:  Presume this is from intentional drug overdose (found with bottle of Flexeril next to him which was empty this would explain presentation particularly with the fluctuating mental status) Alternatively consider seizure, hepatic encephalopathy (he had mild elevated lactulose), and mild metabolic acidosis all which could be contributing factors Plan Continue supportive care Continue lactulose via tube Minimize sedation, will try to discontinue propofol altogether and treat with PRN sedation only Continue IV fluids Watch for hypotension Monitor QTC and obtain 12-lead if QRS complex longer than 100 ms will bolus 100 mEq of bicarbonate EEG today Check total CKs If his seizures would treat temporarily with Versed, per literature research no role for anticonvulsants at this point Close observation of fever curve watching for hypothermia Will contact poison control,  currently seems stable enough not convinced we need to consider emergent dialysis at this point  Acute respiratory failure secondary to ineffective airway clearance/protection Endotracheal  tubes in satisfactory position.  Heart border is normal.  Chest x-ray appears clear Plan Continue full ventilator support  Check arterial blood gas VAP bundle Wean PEEP/FiO2 Assessment for weaning once mental status will support this A.m. chest x-ray, as had vomited we will also watch fever and white blood cell curve with concern for aspiration of note chest x-ray clear on presentation  History of alcohol related cirrhosis Plan Continue to trend LFTs Repeat ammonia in a.m. Continue lactulose May need to consider rifaximin mean however seems as though presentation was likely from drug overdose Ck INR  Mild normal anion gap metabolic acidosis Has mild hyperchloremia, there is also report of being found in emesis, this could explain bicarbonate loss  plan IV hydration with lactated Ringer's Serial chemistries Strict intake output Repeat arterial blood gas Repeat lactic acid  Vomiting Plan Check lipase  Mild anemia Plan Trend cbc   Best practice:  Diet: NPO Pain/Anxiety/Delirium protocol (if indicated): PAD protocol 4/19 VAP protocol (if indicated): 4/19 DVT prophylaxis: scd GI prophylaxis: PPI Glucose control: CBGs Mobility: BR Code Status: full code  Family Communication: pending  Disposition:   Labs   CBC: No results for input(s): WBC, NEUTROABS, HGB, HCT, MCV, PLT in the last 168 hours.  Basic Metabolic Panel: No results for input(s): NA, K, CL, CO2, GLUCOSE, BUN, CREATININE, CALCIUM, MG, PHOS in the last 168 hours. GFR: CrCl cannot be calculated (No successful lab value found.). No results for input(s): PROCALCITON, WBC, LATICACIDVEN in the last 168 hours.  Liver Function Tests: No results for input(s): AST, ALT, ALKPHOS, BILITOT, PROT, ALBUMIN in the last 168 hours. No results for input(s): LIPASE, AMYLASE in the last 168 hours. No results for input(s): AMMONIA in the last 168 hours.  ABG No results found for: PHART, PCO2ART, PO2ART, HCO3, TCO2,  ACIDBASEDEF, O2SAT   Coagulation Profile: No results for input(s): INR, PROTIME in the last 168 hours.  Cardiac Enzymes: No results for input(s): CKTOTAL, CKMB, CKMBINDEX, TROPONINI in the last 168 hours.  HbA1C: No results found for: HGBA1C  CBG: No results for input(s): GLUCAP in the last 168 hours.  Review of Systems:   Not able Past Medical History  He, has a history of alcohol abuse, substance abuse, cirrhosis of the liver on lactulose.  Recently worked up for syncope.  Prio r pulmonary emboli, COPD.,  Hepatitis a, portal hypertension.  Surgical History   Not available    Social History    Not available currently  Family History   His family history is not on file.   Allergies Tramadol  Home Medications  Prior to Admission medications   gabaPenton 300 mg p.o. 3 times daily, furosemide 40 mg p.o. daily Lactulose 20 mg p.o. 3 times daily     Critical care time: 32 minutes       Attending MD note  Patient was independently seen and examined, treatment plan was discussed with the  Advance Practice Provider. I agree with the above note by NP Babcock. I have personally reviewed the clinical findings, labs, ECG, imaging studies and management of this patient in detail. I have also reviewed the orders written for this patient which were under my direction. I agree with the documentation, as recorded by the Advance Practice Provider.   Briefly, Darroll Bredeson is a 58 y.o. male with a history of cirrhosis,  alcohol use, syncope, COPD, PE, portal hypotension.    Subjective: Sedated.    Objective: Vitals:   04/13/19 1252  SpO2: 97%    FiO2 (%):  [30 %] 30 % No intake or output data in the 24 hours ending 10/23/18 2358 General:  NAD sedated Neuro:  Minimal response to pain HEENT:  [place]/AT, No JVD noted, PERRL Cardiovascular:  RRR, no MRG Lungs:  CTAB Abdomen:  Soft, non-distended Musculoskeletal:  No acute deformity Skin:  Intact, MMM  CXR images CTAB   Impression/Plan: AMS, likely 2/2 flexeril overdose.  Cont to monitor closely Plan per note above.     This patient is critically ill, requiring high complexity decision making for assessment and plan, frequent evaluation, application of advanced monitoring and extensive interpretations of multiple databases.    Critical Care time devoted to patient care services described in this note is 35  minutes, not including time spent on procedures, teaching or supervising.    Charlotte Sanes, MD

## 2019-04-13 NOTE — Procedures (Signed)
History: 58 year old male being evaluated for encephalopathy  Sedation: Propofol, and possibly overdosed on Flexeril  Technique: This is a 21 channel routine scalp EEG performed at the bedside with bipolar and monopolar montages arranged in accordance to the international 10/20 system of electrode placement. One channel was dedicated to EKG recording.    Background: The background consists of a generalized, frontally predominant 8 Hz activity which is intermittently interrupted by brief periods (less than 1 second) of generalized suppression.  Photic stimulation: Physiologic driving is not performed  EEG Abnormalities: Sedated EEG  Clinical Interpretation: This EEG is consistent with a sedated state. There was no seizure or seizure predisposition recorded on this study. Please note that lack of epileptiform activity on EEG does not preclude the possibility of epilepsy.   Ritta Slot, MD [facility] [phone]  If 7pm- 7am, please page neurology on call as listed in AMION.

## 2019-04-13 NOTE — Progress Notes (Signed)
Pt arriving via [facility]. Report recvd from [facility]. RN previously. VSS on arrival. Pt with burst of great agitation. Unsuccessful attempts to wean down on propofol as patient vaccilates between Rass -3 and +4.

## 2019-04-13 NOTE — Progress Notes (Signed)
EEG completed, results pending. 

## 2019-04-14 ENCOUNTER — Inpatient Hospital Stay (HOSPITAL_COMMUNITY): Payer: Self-pay

## 2019-04-14 DIAGNOSIS — R4182 Altered mental status, unspecified: Secondary | ICD-10-CM

## 2019-04-14 DIAGNOSIS — R569 Unspecified convulsions: Secondary | ICD-10-CM | POA: Diagnosis not present

## 2019-04-14 LAB — BLOOD GAS, ARTERIAL
Acid-base deficit: 3.9 mmol/L — ABNORMAL HIGH (ref 0.0–2.0)
Bicarbonate: 19.4 mmol/L — ABNORMAL LOW (ref 20.0–28.0)
Drawn by: 404151
FIO2: 40
MECHVT: 560 mL
O2 Saturation: 99.5 %
PEEP: 5 cmH2O
Patient temperature: 98.6
RATE: 16 resp/min
pCO2 arterial: 28.6 mmHg — ABNORMAL LOW (ref 32.0–48.0)
pH, Arterial: 7.447 (ref 7.350–7.450)
pO2, Arterial: 170 mmHg — ABNORMAL HIGH (ref 83.0–108.0)

## 2019-04-14 LAB — GLUCOSE, CAPILLARY
Glucose-Capillary: 101 mg/dL — ABNORMAL HIGH (ref 70–99)
Glucose-Capillary: 101 mg/dL — ABNORMAL HIGH (ref 70–99)
Glucose-Capillary: 85 mg/dL (ref 70–99)
Glucose-Capillary: 88 mg/dL (ref 70–99)
Glucose-Capillary: 89 mg/dL (ref 70–99)
Glucose-Capillary: 96 mg/dL (ref 70–99)

## 2019-04-14 LAB — CBC
HCT: 29 % — ABNORMAL LOW (ref 39.0–52.0)
Hemoglobin: 9.4 g/dL — ABNORMAL LOW (ref 13.0–17.0)
MCH: 31.9 pg (ref 26.0–34.0)
MCHC: 32.4 g/dL (ref 30.0–36.0)
MCV: 98.3 fL (ref 80.0–100.0)
Platelets: 116 10*3/uL — ABNORMAL LOW (ref 150–400)
RBC: 2.95 MIL/uL — ABNORMAL LOW (ref 4.22–5.81)
RDW: 16.6 % — ABNORMAL HIGH (ref 11.5–15.5)
WBC: 5.1 10*3/uL (ref 4.0–10.5)
nRBC: 0 % (ref 0.0–0.2)

## 2019-04-14 LAB — HEPATIC FUNCTION PANEL
ALT: 33 U/L (ref 0–44)
AST: 51 U/L — ABNORMAL HIGH (ref 15–41)
Albumin: 2.6 g/dL — ABNORMAL LOW (ref 3.5–5.0)
Alkaline Phosphatase: 106 U/L (ref 38–126)
Bilirubin, Direct: 0.6 mg/dL — ABNORMAL HIGH (ref 0.0–0.2)
Indirect Bilirubin: 0.9 mg/dL (ref 0.3–0.9)
Total Bilirubin: 1.5 mg/dL — ABNORMAL HIGH (ref 0.3–1.2)
Total Protein: 6.6 g/dL (ref 6.5–8.1)

## 2019-04-14 LAB — PHOSPHORUS: Phosphorus: 4.6 mg/dL (ref 2.5–4.6)

## 2019-04-14 LAB — BASIC METABOLIC PANEL
Anion gap: 5 (ref 5–15)
BUN: 18 mg/dL (ref 6–20)
CO2: 18 mmol/L — ABNORMAL LOW (ref 22–32)
Calcium: 8.5 mg/dL — ABNORMAL LOW (ref 8.9–10.3)
Chloride: 114 mmol/L — ABNORMAL HIGH (ref 98–111)
Creatinine, Ser: 0.82 mg/dL (ref 0.61–1.24)
GFR calc Af Amer: >60 mL/min (ref >60)
GFR calc non Af Amer: >60 mL/min (ref >60)
Glucose, Bld: 115 mg/dL — ABNORMAL HIGH (ref 70–99)
Potassium: 3.7 mmol/L (ref 3.5–5.1)
Sodium: 137 mmol/L (ref 135–145)

## 2019-04-14 LAB — MAGNESIUM: Magnesium: 1.9 mg/dL (ref 1.7–2.4)

## 2019-04-14 LAB — AMMONIA: Ammonia: 62 umol/L — ABNORMAL HIGH (ref 9–35)

## 2019-04-14 LAB — HIV ANTIBODY (ROUTINE TESTING W REFLEX): HIV Screen 4th Generation wRfx: NONREACTIVE

## 2019-04-14 LAB — MRSA PCR SCREENING: MRSA by PCR: POSITIVE — AB

## 2019-04-14 MED ORDER — ENOXAPARIN SODIUM 40 MG/0.4ML ~~LOC~~ SOLN
40.0000 mg | SUBCUTANEOUS | Status: DC
  Administered 2019-04-14 – 2019-04-18 (×5): 40 mg via SUBCUTANEOUS
  Filled 2019-04-14 (×6): qty 0.4

## 2019-04-14 MED ORDER — DEXMEDETOMIDINE HCL IN NACL 400 MCG/100ML IV SOLN
0.4000 ug/kg/h | INTRAVENOUS | Status: DC
  Administered 2019-04-14: 1 ug/kg/h via INTRAVENOUS
  Administered 2019-04-14: 0.6 ug/kg/h via INTRAVENOUS
  Administered 2019-04-15: 0.8 ug/kg/h via INTRAVENOUS
  Administered 2019-04-15 (×2): 1 ug/kg/h via INTRAVENOUS
  Administered 2019-04-16 (×2): 1.2 ug/kg/h via INTRAVENOUS
  Filled 2019-04-14 (×7): qty 100
  Filled 2019-04-14: qty 200

## 2019-04-14 MED ORDER — PANTOPRAZOLE SODIUM 40 MG PO PACK
40.0000 mg | PACK | Freq: Every day | ORAL | Status: DC
  Administered 2019-04-14: 40 mg
  Filled 2019-04-14: qty 20

## 2019-04-14 MED ORDER — ORAL CARE MOUTH RINSE: 15.0000 mL | Freq: Two times a day (BID) | OROMUCOSAL | Status: DC

## 2019-04-14 MED ORDER — MUPIROCIN 2 % EX OINT
1.0000 "application " | TOPICAL_OINTMENT | Freq: Two times a day (BID) | CUTANEOUS | Status: DC
  Administered 2019-04-14 – 2019-04-17 (×6): 1 via NASAL
  Filled 2019-04-14 (×2): qty 22

## 2019-04-14 MED ORDER — CHLORHEXIDINE GLUCONATE CLOTH 2 % EX PADS
6.0000 | MEDICATED_PAD | Freq: Every day | CUTANEOUS | Status: AC
  Administered 2019-04-15 – 2019-04-18 (×4): 6 via TOPICAL

## 2019-04-14 NOTE — Progress Notes (Signed)
Safety sitter at bedside calmy with pt - He is screaming " Fuck you bitch" over and over - text page  to MD for advice - now screaming " you're an asshole" Again asked pt if I can get him anything and if he is in pain - no reply

## 2019-04-14 NOTE — Progress Notes (Signed)
PROGRESS NOTE    Taylor Olson  WFU:[id number] DOB: 1961/08/24 DOA: 04/13/2019 PCP: No primary care provider on file.   Brief Narrative:  Per HPI: This is a 58 year old male who presented to [place] ER after being found with altered mental status. On ER eval had rapid pupillary changes (dilating and constricting), fluctuant mental status between awake and unresponsive, apparently takes baclofen, and gabapentin and was found w/ empty bottle of flexeril near him on EMS arrival In ER he was intubated.  Per emergency room report was found in vomit. Dx eval in ER  CT brain neg + metabolic acidosis, CBC was unremarkable mild metabolic acidosis with anion gap of 13, lactic acid of 1.8 mildly elevated chloride otherwise normal blood chemistry.  Ammonia level 35  Patient was admitted with acute toxic and metabolic encephalopathy along with acute respiratory failure secondary to ineffective airway protection.  Assessment & Plan:   Active Problems:   Acute metabolic encephalopathy   1. Acute toxic/metabolic encephalopathy.  Presumed secondary to drug overdose intentional versus unintentional.  No signs of seizure activity noted on EEG which was completed on 4/19.  Patient also noted to have mild ammonia elevation, but doubtful that this is the full picture.  Continue supportive care with improvement in encephalopathy noted this morning and clear chest x-ray.  Hopeful extubation per PCCM later today. 2. Acute respiratory failure secondary to above.  Hopeful extubation soon per PCCM.  Chest x-ray with no acute findings this a.m.  Appears to be more alert and awake. 3. History of alcoholic cirrhosis.  Continue to trend LFTs and ammonia levels.  Maintain on lactulose. 4. Mild anemia.  Noted to be stable on CBC.  No overt bleeding identified.  Recheck labs in a.m.   DVT prophylaxis:SCDs Code Status: Full Family Communication: Will attempt to call family Disposition Plan: Extubation per PCCM. Will  require psych eval once medically stable to determine intentional overdose.    Consultants:   PCCM  Procedures:   Intubation  Antimicrobials:   None   Subjective: Patient seen and evaluated today with no new acute complaints or concerns. He is more awake and slightly agitated this am. CXR with no acute abnormalities.   Objective: Vitals:   04/14/19 0636 04/14/19 0700 04/14/19 0753 04/14/19 0810  BP: 108/83 125/79  104/65  Pulse: 82 86 78 76  Resp: (!) 25 (!) 24 (!) 23 (!) 22  Temp:      TempSrc:      SpO2: 98% 99% 98% 98%  Weight:      Height:        Intake/Output Summary (Last 24 hours) at 04/14/2019 0813 Last data filed at 04/14/2019 0600 Gross per 24 hour  Intake 2056.24 ml  Output 1175 ml  Net 881.24 ml   Filed Weights   04/13/19 1500 04/14/19 0500  Weight: 88 kg 88.1 kg    Examination:  General exam: Appears mildly agitated Respiratory system: Clear to auscultation. Respiratory effort normal.  Intubated on FiO2 40%. Cardiovascular system: S1 & S2 heard, RRR. No JVD, murmurs, rubs, gallops or clicks. No pedal edema. Gastrointestinal system: Abdomen is nondistended, soft and nontender. No organomegaly or masses felt. Normal bowel sounds heard. Central nervous system: Arousable to commands. Extremities: No edema. Skin: No rashes, lesions or ulcers Psychiatry: Cannot be fully evaluated given patient condition.    Data Reviewed: I have personally reviewed following labs and imaging studies  CBC: Recent Labs  Lab 04/13/19 1418 04/14/19 0541  WBC  --  5.1  HGB 8.8* 9.4*  HCT 26.0* 29.0*  MCV  --  98.3  PLT  --  761*   Basic Metabolic Panel: Recent Labs  Lab 04/13/19 1418 04/13/19 1507 04/13/19 2307 04/14/19 0432  NA 143 140 137  --   K 4.0 4.0 3.7  --   CL  --  112* 114*  --   CO2  --  19* 18*  --   GLUCOSE  --  111* 115*  --   BUN  --  17 18  --   CREATININE  --  0.73 0.82  --   CALCIUM  --  8.9 8.5*  --   MG  --  2.0  --  1.9  PHOS   --  4.8*  --  4.6   GFR: Estimated Creatinine Clearance: 107.9 mL/min (by C-G formula based on SCr of 0.82 mg/dL). Liver Function Tests: Recent Labs  Lab 04/14/19 0432  AST 51*  ALT 33  ALKPHOS 106  BILITOT 1.5*  PROT 6.6  ALBUMIN 2.6*   Recent Labs  Lab 04/13/19 1507  LIPASE 29   Recent Labs  Lab 04/14/19 0432  AMMONIA 62*   Coagulation Profile: Recent Labs  Lab 04/13/19 1507  INR 1.1   Cardiac Enzymes: Recent Labs  Lab 04/13/19 1507  CKTOTAL 13*  CKMB 1.7   BNP (last 3 results) No results for input(s): PROBNP in the last 8760 hours. HbA1C: No results for input(s): HGBA1C in the last 72 hours. CBG: Recent Labs  Lab 04/13/19 1625 04/13/19 1951 04/13/19 2338 04/14/19 0424 04/14/19 0752  GLUCAP 100* 89 101* 96 101*   Lipid Profile: Recent Labs    04/13/19 1507  TRIG 88   Thyroid Function Tests: No results for input(s): TSH, T4TOTAL, FREET4, T3FREE, THYROIDAB in the last 72 hours. Anemia Panel: No results for input(s): VITAMINB12, FOLATE, FERRITIN, TIBC, IRON, RETICCTPCT in the last 72 hours. Sepsis Labs: Recent Labs  Lab 04/13/19 1507 04/13/19 1717  LATICACIDVEN 1.7 1.0    Recent Results (from the past 240 hour(s))  Culture, blood (routine x 2)     Status: None (Preliminary result)   Collection Time: 04/13/19  3:20 PM  Result Value Ref Range Status   Specimen Description BLOOD LEFT ANTECUBITAL  Final   Special Requests   Final    BOTTLES DRAWN AEROBIC ONLY Blood Culture adequate volume   Culture   Final    NO GROWTH <12 HOURS Performed at [facility], [address] [place], [place] [address]    Report Status PENDING  Incomplete  Culture, blood (routine x 2)     Status: None (Preliminary result)   Collection Time: 04/13/19  3:30 PM  Result Value Ref Range Status   Specimen Description BLOOD RIGHT HAND  Final   Special Requests   Final    BOTTLES DRAWN AEROBIC ONLY Blood Culture results may not be optimal due to an excessive  volume of blood received in culture bottles   Culture   Final    NO GROWTH <12 HOURS Performed at [facility], [place] [address], [place], [place] [address]    Report Status PENDING  Incomplete  MRSA PCR Screening     Status: Abnormal   Collection Time: 04/13/19 10:37 PM  Result Value Ref Range Status   MRSA by PCR POSITIVE (A) NEGATIVE Final    Comment:        The GeneXpert MRSA Assay (FDA approved for NASAL specimens only), is  one component of a comprehensive MRSA colonization surveillance program. It is not intended to diagnose MRSA infection nor to guide or monitor treatment for MRSA infections. RESULT CALLED TO, READ BACK BY AND VERIFIED WITH: RN K PHILLIPS @0023  04/14/19 BY S GEZAHEGN Performed at [facility], [place] [address] [place], [place] [address]          Radiology Studies: Dg Chest Port 1 View  Result Date: 04/14/2019 CLINICAL DATA:  Acute respiratory failure.  Patient intubated. EXAM: PORTABLE CHEST 1 VIEW COMPARISON:  April 13, 2019 FINDINGS: The ETT is in good position. The NG tube terminates below today's film. No pneumothorax. The cardiomediastinal silhouette is stable. No pulmonary nodules, masses, or focal infiltrates. No acute abnormalities identified. IMPRESSION: 1. Support apparatus as above. 2. No acute abnormality otherwise seen. Electronically Signed   By: Dorise Bullion III M.D   On: 04/14/2019 07:58   Dg Chest Port 1 View  Result Date: 04/13/2019 CLINICAL DATA:  OG tube placement. EXAM: PORTABLE CHEST 1 VIEW COMPARISON:  None. FINDINGS: UPPER limits normal heart size noted. An endotracheal tube is identified with tip 6 cm above the carina. An OG tube enters the stomach with tip off the field of view. No definite airspace disease, pleural effusion or pneumothorax. IMPRESSION: 1. No acute abnormality 2. OG tube entering the stomach with tip off the field of view. Electronically Signed   By: Margarette Canada M.D.   On: 04/13/2019 13:26         Scheduled Meds: . chlorhexidine gluconate (MEDLINE KIT)  15 mL Mouth Rinse BID  . Chlorhexidine Gluconate Cloth  6 each Topical Q0600  . lactulose  20 g Per Tube TID  . mouth rinse  15 mL Mouth Rinse 10 times per day  . mupirocin ointment  1 application Nasal BID  . pantoprazole (PROTONIX) IV  40 mg Intravenous QHS   Continuous Infusions: . lactated ringers 125 mL/hr at 04/14/19 0600  . propofol (DIPRIVAN) infusion 50 mcg/kg/min (04/14/19 6237)     LOS: 1 day    Time spent: 30 minutes    Pratik Darleen Crocker, DO [facility] Pager [phone]  If 7PM-7AM, please contact night-coverage [email or web address] Password [email or web address] 04/14/2019, 8:14 AM

## 2019-04-14 NOTE — Procedures (Signed)
Extubation Procedure Note  Patient Details:   Name: Taylor Olson DOB: Aug 07, 1961 MRN: [id number]   Airway Documentation:    Vent end date: 04/14/19 Vent end time: 1350   Evaluation  O2 sats: stable throughout Complications: No apparent complications Patient did tolerate procedure well. Bilateral Breath Sounds: Clear, Diminished   Yes   Patient extubated to 4L [place] without complications. RN at bedside. Positive cuff leak. No stridor noted. Vitals are stable. RT will continue to monitor.  Rance Muir 04/14/2019, 1:53 PM

## 2019-04-14 NOTE — Progress Notes (Signed)
Extubated for 10 min -02 sat on 4 l n/c is 100 % will continue to titrate down since pt has Hx of COPD - not following commands looking dazed pupils PERL at 3 MM - able to say his first name - restless than falls back to sleep - HOB up 40 to 45 degrees sitter at bedside due to patient being an overdose -

## 2019-04-14 NOTE — Progress Notes (Signed)
Spoke to patients brother-in-law whom patient lives with about his social updates for case Production designer, theatre/television/film and social work : Mauricio Po -( brother-in-law) [place] Spaniel that patient did live in [place] in a camper than was admitted to [facility] for syncope  & discharged on Good Friday. He drove patient to his home in [place] since pt is basically homeless now and he has been living here  for one week. Patient was also in [facility] hospital last week also for syncope.Patient has [employer] benefits Member ID # [id number]  Plan ID # [id number]

## 2019-04-14 NOTE — Progress Notes (Signed)
NAME:  Taylor Olson, MRN:  [id number], DOB:  [birth year], LOS: 1 ADMISSION DATE:  04/13/2019, CONSULTATION DATE:  04/13/2019 REFERRING MD:  [facility] ED, CHIEF COMPLAINT: Flexeril overdose  HPI/course in hospital  Found with altered mental status.  Empty Flexeril bottle found near him.  Also takes baclofen and gabapentin.  Intubated for airway protection.  Past Medical History  No past medical history on file.       Interim history/subjective:  Will awake and is periodically agitated but not following commands as yet.  Objective   Blood pressure 112/74, pulse 81, temperature 99.3 F (37.4 C), temperature source Oral, resp. rate (!) 21, height 5\' 9"  (1.753 m), weight 88.1 kg, SpO2 98 %.    Vent Mode: PSV;CPAP FiO2 (%):  [40 %-50 %] 40 % Set Rate:  [16 bmp] 16 bmp Vt Set:  [560 mL] 560 mL PEEP:  [5 cmH20] 5 cmH20 Pressure Support:  [5 cmH20-8 cmH20] 5 cmH20 Plateau Pressure:  [14 cmH20-16 cmH20] 14 cmH20   Intake/Output Summary (Last 24 hours) at 04/14/2019 0951 Last data filed at 04/14/2019 0800 Gross per 24 hour  Intake 2358.35 ml  Output 1225 ml  Net 1133.35 ml   Filed Weights   04/13/19 1500 04/14/19 0500  Weight: 88 kg 88.1 kg    Examination: Physical Exam   Ancillary tests (personally reviewed)  CBC: Recent Labs  Lab 04/13/19 1418 04/14/19 0541  WBC  --  5.1  HGB 8.8* 9.4*  HCT 26.0* 29.0*  MCV  --  98.3  PLT  --  116*    Basic Metabolic Panel: Recent Labs  Lab 04/13/19 1418 04/13/19 1507 04/13/19 2307 04/14/19 0432  NA 143 140 137  --   K 4.0 4.0 3.7  --   CL  --  112* 114*  --   CO2  --  19* 18*  --   GLUCOSE  --  111* 115*  --   BUN  --  17 18  --   CREATININE  --  0.73 0.82  --   CALCIUM  --  8.9 8.5*  --   MG  --  2.0  --  1.9  PHOS  --  4.8*  --  4.6   GFR: Estimated Creatinine Clearance: 107.9 mL/min (by C-G formula based on SCr of 0.82 mg/dL). Recent Labs  Lab 04/13/19 1507 04/13/19 1717 04/14/19 0541  WBC  --   --  5.1  LATICACIDVEN  1.7 1.0  --     Liver Function Tests: Recent Labs  Lab 04/14/19 0432  AST 51*  ALT 33  ALKPHOS 106  BILITOT 1.5*  PROT 6.6  ALBUMIN 2.6*   Recent Labs  Lab 04/13/19 1507  LIPASE 29   Recent Labs  Lab 04/14/19 0432  AMMONIA 62*    ABG    Component Value Date/Time   PHART 7.447 04/14/2019 0520   PCO2ART 28.6 (L) 04/14/2019 0520   PO2ART 170 (H) 04/14/2019 0520   HCO3 19.4 (L) 04/14/2019 0520   TCO2 22 04/13/2019 1418   ACIDBASEDEF 3.9 (H) 04/14/2019 0520   O2SAT 99.5 04/14/2019 0520     Coagulation Profile: Recent Labs  Lab 04/13/19 1507  INR 1.1    Cardiac Enzymes: Recent Labs  Lab 04/13/19 1507  CKTOTAL 13*  CKMB 1.7    HbA1C: No results found for: HGBA1C  CBG: Recent Labs  Lab 04/13/19 1625 04/13/19 1951 04/13/19 2338 04/14/19 0424 04/14/19 0752  GLUCAP 100* 89 101* 96 101*  Assessment & Plan:   Critically ill due to acute respiratory failure secondary to loss of airway protection.  Requiring mechanical ventilation. Acute toxic encephalopathy secondary to drug overdose.  Intentionality unknown. Alcoholic cirrhosis by history. Mild anemia.  Plan:  Wean sedation and extubate once more awake. Obtain corollary history and proceed with possible psychiatric evaluation. Continue lactulose to prevent hepatic encephalopathy  Best practice:  Diet: Hold pending extubation Pain/Anxiety/Delirium protocol (if indicated): Continue low-dose propofol for tube tolerance VAP protocol (if indicated): Valve bundle in place DVT prophylaxis: Enoxaparin GI prophylaxis: Pantoprazole Urinary catheter: Guide hemodynamic management Glucose control: Euglycemic Mobility: Bedrest Code Status: Full code Family Communication: No family update Disposition: Plan to extubate once mental status allows   CRITICAL CARE Performed by: Lynnell Catalanavi Terren Haberle   Total critical care time: 35 minutes  Critical care time was exclusive of separately billable procedures  and treating other patients.  Critical care was necessary to treat or prevent imminent or life-threatening deterioration.  Critical care was time spent personally by me on the following activities: development of treatment plan with patient and/or surrogate as well as nursing, discussions with consultants, evaluation of patient's response to treatment, examination of patient, obtaining history from patient or surrogate, ordering and performing treatments and interventions, ordering and review of laboratory studies, ordering and review of radiographic studies, pulse oximetry, re-evaluation of patient's condition and participation in multidisciplinary rounds.  Lynnell Catalanavi Jozsef Wescoat, MD [facility] ICU Physician [facility] [facility]  Pager: [phone] Mobile: [phone] After hours: [phone].      Lynnell Catalanavi Kiandria Clum, MD [facility] ICU Physician [facility] [place]  Pager: [phone] Mobile: [phone] After hours: [phone].  04/14/2019, 9:51 AM

## 2019-04-14 NOTE — Progress Notes (Signed)
Pt alternating moaning & crying - spitting at staff - told pt that spitting is not acceptable - using Yankauer to orally suction pt. Pt kicking the bottom bed rales- tried to talk to pt in a calm manner - Pt denied any pain.

## 2019-04-15 DIAGNOSIS — R569 Unspecified convulsions: Secondary | ICD-10-CM | POA: Diagnosis not present

## 2019-04-15 DIAGNOSIS — R4182 Altered mental status, unspecified: Secondary | ICD-10-CM | POA: Diagnosis not present

## 2019-04-15 LAB — GLUCOSE, CAPILLARY
Glucose-Capillary: 87 mg/dL (ref 70–99)
Glucose-Capillary: 90 mg/dL (ref 70–99)
Glucose-Capillary: 94 mg/dL (ref 70–99)
Glucose-Capillary: 96 mg/dL (ref 70–99)
Glucose-Capillary: 98 mg/dL (ref 70–99)

## 2019-04-15 LAB — URINE CULTURE: Culture: NO GROWTH

## 2019-04-15 MED ORDER — LORAZEPAM 2 MG/ML IJ SOLN
1.0000 mg | Freq: Four times a day (QID) | INTRAMUSCULAR | Status: AC
  Administered 2019-04-15 (×3): 1 mg via INTRAVENOUS
  Filled 2019-04-15 (×3): qty 1

## 2019-04-15 MED ORDER — LORAZEPAM 2 MG/ML IJ SOLN
1.0000 mg | INTRAMUSCULAR | Status: DC | PRN
  Administered 2019-04-15 – 2019-04-18 (×6): 1 mg via INTRAVENOUS
  Filled 2019-04-15 (×6): qty 1

## 2019-04-15 NOTE — Progress Notes (Signed)
NAME:  Taylor PorterRobert Senger, MRN:  [id number], DOB:  1961-05-29, LOS: 2 ADMISSION DATE:  04/13/2019, CONSULTATION DATE:  04/13/2019 [facility]ING [facility]  [facility], CHIEF COMPLAINT: Flexeril overdose  HPI/course in hospital  Found with altered mental status.  Empty Flexeril bottle found near him.  Also takes baclofen and gabapentin.  Intubated for airway protection.  Past Medical History  No past medical history on file.       Interim history/subjective:  Will awake and is periodically agitated but not following commands as yet.  Objective   Blood pressure 91/77, pulse 66, temperature 98.5 F (36.9 C), temperature source Axillary, resp. rate (!) 26, height 5\' 9"  (1.753 m), weight 87.6 kg, SpO2 100 %.        Intake/Output Summary (Last 24 hours) at 04/15/2019 1728 Last data filed at 04/15/2019 1700 Gross per 24 hour  Intake 3008.64 ml  Output 750 ml  Net 2258.64 ml   Filed Weights   04/13/19 1500 04/14/19 0500 04/15/19 0309  Weight: 88 kg 88.1 kg 87.6 kg    Examination: General: thin, periodically agitated. Neuro: not following commands. Moves all limbs HEENT: extubated, no pressure ulceration, dry mucous membranes. Chest: no distress. Normal vesicular breath sounds. CV: HS normal, no edema. Abdomen: soft.  GU: urinary catheter in place Skin/MSK: line sites intact. No trauma.    Ancillary tests (personally reviewed)  CBC: Recent Labs  Lab 04/13/19 1418 04/14/19 0541  WBC  --  5.1  HGB 8.8* 9.4*  HCT 26.0* 29.0*  MCV  --  98.3  PLT  --  116*    Basic Metabolic Panel: Recent Labs  Lab 04/13/19 1418 04/13/19 1507 04/13/19 2307 04/14/19 0432  NA 143 140 137  --   K 4.0 4.0 3.7  --   CL  --  112* 114*  --   CO2  --  19* 18*  --   GLUCOSE  --  111* 115*  --   BUN  --  17 18  --   CREATININE  --  0.73 0.82  --   CALCIUM  --  8.9 8.5*  --   MG  --  2.0  --  1.9  PHOS  --  4.8*  --  4.6   GFR: Estimated Creatinine Clearance: 107.6 mL/min (by C-G formula based on SCr of  0.82 mg/dL). Recent Labs  Lab 04/13/19 1507 04/13/19 1717 04/14/19 0541  WBC  --   --  5.1  LATICACIDVEN 1.7 1.0  --     Liver Function Tests: Recent Labs  Lab 04/14/19 0432  AST 51*  ALT 33  ALKPHOS 106  BILITOT 1.5*  PROT 6.6  ALBUMIN 2.6*   Recent Labs  Lab 04/13/19 1507  LIPASE 29   Recent Labs  Lab 04/14/19 0432  AMMONIA 62*    ABG    Component Value Date/Time   PHART 7.447 04/14/2019 0520   PCO2ART 28.6 (L) 04/14/2019 0520   PO2ART 170 (H) 04/14/2019 0520   HCO3 19.4 (L) 04/14/2019 0520   TCO2 22 04/13/2019 1418   ACIDBASEDEF 3.9 (H) 04/14/2019 0520   O2SAT 99.5 04/14/2019 0520     Coagulation Profile: Recent Labs  Lab 04/13/19 1507  INR 1.1    Cardiac Enzymes: Recent Labs  Lab 04/13/19 1507  CKTOTAL 13*  CKMB 1.7    HbA1C: No results found for: HGBA1C  CBG: Recent Labs  Lab 04/15/19 0004 04/15/19 0334 04/15/19 0800 04/15/19 1129 04/15/19 1532  GLUCAP 87 90 96 94  98     Assessment & Plan:   Active issues:    Was critically ill due to acute respiratory failure secondary to loss of airway protection.  Tolerated extubation but remains at risk for reintubation due to poor mental status. Continue to monitor and encourage mobilization.    Acute toxic encephalopathy secondary to drug overdose.  Intentionality unknown. Continue to wean dexmedetomidine.  Lorazepam intermittently per TRH. Allow time for original ingestants to clear.  Alcoholic cirrhosis by history. Resume lactulose once taking orals  Other issues:  Mild anemia. Observe.  Best practice:  Diet: NPO until mental status improves. Pain/Anxiety/Delirium protocol (if indicated): dexmedetomidine. VAP protocol (if indicated): encourage pulmonary toilette DVT prophylaxis: Enoxaparin GI prophylaxis: Pantoprazole Urinary catheter: Guide hemodynamic management Glucose control: Euglycemic Mobility: Bedrest Code Status: Full code Family Communication: No family  update Disposition: Transfer once off Precedex.   Lynnell Catalan, MD [facility] ICU Physician [facility] [place] Critical Care  Pager: [phone] Mobile: [phone] After hours: [phone].  04/15/2019, 5:28 PM

## 2019-04-15 NOTE — Progress Notes (Signed)
Pt verbally & physically abusive to staff this am, refusing pt care.  Placed mask on pt due to his spitting on staff.

## 2019-04-15 NOTE — Consult Note (Signed)
[facility] Face-to[facility] Consult   Reason for Consult:  "depression/potential suicidal intent" Referring Physician:  Dr. Maurilio LovelyPratik Shah Patient Identification: Taylor Olson MRN:  [id number] Principal Diagnosis: MDD (major depressive disorder), single episode, severe , no psychosis (HCC) Diagnosis:  Active Problems:   Acute metabolic encephalopathy   Total Time spent with patient: 1 hour  Subjective:   Taylor Olson is a 58 y.o. male patient admitted with altered mental status.  HPI:   Per chart review, patient was admitted with acute toxic/metabolic encephalopathy. He initially presented to [facility] after he was found with altered mental status and an empty bottle of Flexeril near him. He required intubation for airway protection and Precedex for agitation. He was initially verbally and physically aggressive to staff but his mental status has significantly improved. Yesterday, he informed the Chaplain of "regret for his actions." Primary team reports that he has appeared depressed and reports that his wife left him due to his illness. Home medications include Gabapentin and Baclofen. He is receiving Ativan 1 mg q 4 hours in the hospital.  He also received two PRN doses over the past 24 hours for anxiety.   On interview, Taylor Olson reports worsening mood over the past 3 months due to separation from his wife. 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 He reports listening to music just prior to hospitalization which reminded him of his wife and lead to his overdose. He reports ingesting an unknown amount of an old prescription of Baclofen. He denies ingesting other substances. He denies current SI or a history of suicide attempts. He denies HI or AVH. He denies problems with sleep or appetite. He denies a prior history of depression.   Past Psychiatric History: Denies   Risk to Self:  Yes given suicide attempt.  Risk to Others:   None. Denies HI. Prior Inpatient Therapy:  Denies  Prior Outpatient Therapy:  Denies   Past Medical History: No past medical history on file. The histories are not reviewed yet. Please review them in the "History" navigator section and refresh this SmartLink. Family History: No family history on file. Family Psychiatric  History: Father-committed suicide.  Social History:  Social History   Substance and Sexual Activity  Alcohol Use Not on file     Social History   Substance and Sexual Activity  Drug Use Not on file    Social History   Socioeconomic History  . Marital status: Unknown    Spouse name: Not on file  . Number of children: Not on file  . Years of education: Not on file  . Highest education level: Not on file  Occupational History  . Not on file  Social Needs  . Financial resource strain: Not on file  . Food insecurity:    Worry: Not on file    Inability: Not on file  . Transportation needs:    Medical: Not on file    Non-medical: Not on file  Tobacco Use  . Smoking status: Not on file  Substance and Sexual Activity  . Alcohol use: Not on file  . Drug use: Not on file  . Sexual activity: Not on file  Lifestyle  . Physical activity:    Days per week: Not on file    Minutes per session: Not on file  . Stress: Not on file  Relationships  . Social connections:    Talks on phone: Not on file    Gets  together: Not on file    Attends religious service: Not on file    Active member of club or organization: Not on file    Attends meetings of clubs or organizations: Not on file    Relationship status: Not on file  Other Topics Concern  . Not on file  Social History Narrative  . Not on file   Additional Social History: He lives with his brother-in-law and sister x 1 week in [place]. He was previously living in [place], [place] in a camper. He is separated from his wife x 3 months. He does not have children. He denies alcohol or illicit substance use. He reports  a history of heavy substance use several years ago.     Allergies:   Allergies  Allergen Reactions  . Tramadol     On paperwork from [facility]:  Results for orders placed or performed during the hospital encounter of 04/13/19 (from the past 48 hour(s))  I-STAT 7, (LYTES, BLD GAS, ICA, H+H)     Status: Abnormal   Collection Time: 04/13/19  2:18 PM  Result Value Ref Range   pH, Arterial 7.449 7.350 - 7.450   pCO2 arterial 29.8 (L) 32.0 - 48.0 mmHg   pO2, Arterial 242.0 (H) 83.0 - 108.0 mmHg   Bicarbonate 20.7 20.0 - 28.0 mmol/L   TCO2 22 22 - 32 mmol/L   O2 Saturation 100.0 %   Acid-base deficit 3.0 (H) 0.0 - 2.0 mmol/L   Sodium 143 135 - 145 mmol/L   Potassium 4.0 3.5 - 5.1 mmol/L   Calcium, Ion 1.26 1.15 - 1.40 mmol/L   HCT 26.0 (L) 39.0 - 52.0 %   Hemoglobin 8.8 (L) 13.0 - 17.0 g/dL   Patient temperature 16.1 F    Collection site RADIAL, ALLEN'S TEST ACCEPTABLE    Drawn by Operator    Sample type ARTERIAL   HIV antibody (Routine Testing)     Status: None   Collection Time: 04/13/19  3:07 PM  Result Value Ref Range   HIV Screen 4th Generation wRfx Non Reactive Non Reactive    Comment: (NOTE) Performed At: [facility] [address] [place], [place] [id number] Jolene Schimke MD WJ:[id number]   Basic metabolic panel     Status: Abnormal   Collection Time: 04/13/19  3:07 PM  Result Value Ref Range   Sodium 140 135 - 145 mmol/L   Potassium 4.0 3.5 - 5.1 mmol/L   Chloride 112 (H) 98 - 111 mmol/L   CO2 19 (L) 22 - 32 mmol/L   Glucose, Bld 111 (H) 70 - 99 mg/dL   BUN 17 6 - 20 mg/dL   Creatinine, Ser 2.13 0.61 - 1.24 mg/dL   Calcium 8.9 8.9 - 08.6 mg/dL   GFR calc non Af Amer >60 >60 mL/min   GFR calc Af Amer >60 >60 mL/min   Anion gap 9 5 - 15    Comment: Performed at [facility], [address], [place], [place] [address]  Magnesium     Status: None   Collection Time: 04/13/19  3:07 PM  Result Value Ref Range   Magnesium 2.0 1.7 -  2.4 mg/dL    Comment: Performed at [facility], [address], [place], [place] [address]  Phosphorus     Status: Abnormal   Collection Time: 04/13/19  3:07 PM  Result Value Ref Range   Phosphorus 4.8 (H) 2.5 - 4.6 mg/dL    Comment: Performed  at [facility], [address], [place], [place] [address]  Lipase, blood     Status: None   Collection Time: 04/13/19  3:07 PM  Result Value Ref Range   Lipase 29 11 - 51 U/L    Comment: Performed at [facility], [address] [place], [place] [address]  Lactic acid, plasma     Status: None   Collection Time: 04/13/19  3:07 PM  Result Value Ref Range   Lactic Acid, Venous 1.7 0.5 - 1.9 mmol/L    Comment: Performed at [facility], [address], [place], [place] [address]  Protime-INR     Status: None   Collection Time: 04/13/19  3:07 PM  Result Value Ref Range   Prothrombin Time 14.4 11.4 - 15.2 seconds   INR 1.1 0.8 - 1.2    Comment: (NOTE) INR goal varies based on device and disease states. Performed at [facility], [address] [place], [place] [address]   CK total and CKMB (cardiac)not at [facility]     Status: Abnormal   Collection Time: 04/13/19  3:07 PM  Result Value Ref Range   Total CK 13 (L) 49 - 397 U/L   CK, MB 1.7 0.5 - 5.0 ng/mL   Relative Index RELATIVE INDEX IS INVALID 0.0 - 2.5    Comment: WHEN CK < 100 U/L        Performed at [facility], [address] [place], [place] [address]   Triglycerides     Status: None   Collection Time: 04/13/19  3:07 PM  Result Value Ref Range   Triglycerides 88 <150 mg/dL    Comment: Performed at [facility], [address] [place], [place] [address]  Culture, blood (routine x 2)     Status: None (Preliminary result)   Collection Time: 04/13/19  3:20 PM  Result Value Ref Range   Specimen Description BLOOD LEFT ANTECUBITAL    Special Requests      BOTTLES DRAWN AEROBIC ONLY Blood Culture adequate volume   Culture      NO GROWTH 2  DAYS Performed at [facility], [address], [place], [place] [address]    Report Status PENDING   Culture, blood (routine x 2)     Status: None (Preliminary result)   Collection Time: 04/13/19  3:30 PM  Result Value Ref Range   Specimen Description BLOOD RIGHT HAND    Special Requests      BOTTLES DRAWN AEROBIC ONLY Blood Culture results may not be optimal due to an excessive volume of blood received in culture bottles   Culture      NO GROWTH 2 DAYS Performed at [facility], [address], [place], [place] [address]    Report Status PENDING   Glucose, capillary     Status: Abnormal   Collection Time: 04/13/19  4:25 PM  Result Value Ref Range   Glucose-Capillary 100 (H) 70 - 99 mg/dL  Lactic acid, plasma     Status: None   Collection Time: 04/13/19  5:17 PM  Result Value Ref Range   Lactic Acid, Venous 1.0 0.5 - 1.9 mmol/L    Comment: Performed at [facility], [address], [place], [place] [address]  Glucose, capillary     Status: None   Collection Time: 04/13/19  7:51 PM  Result Value Ref Range   Glucose-Capillary 89 70 - 99 mg/dL  Comment 1 Notify RN    Comment 2 Document in Chart   MRSA PCR Screening     Status: Abnormal   Collection Time: 04/13/19 10:37 PM  Result Value Ref Range   MRSA by PCR POSITIVE (A) NEGATIVE    Comment:        The GeneXpert MRSA Assay (FDA approved for NASAL specimens only), is one component of a comprehensive MRSA colonization surveillance program. It is not intended to diagnose MRSA infection nor to guide or monitor treatment for MRSA infections. RESULT CALLED TO, READ BACK BY AND VERIFIED WITH: RN K PHILLIPS  04/14/19 BY S GEZAHEGN Performed at [facility], [address] [place], [place] [address]   Basic metabolic panel     Status: Abnormal   Collection Time: 04/13/19 11:07 PM  Result Value Ref Range   Sodium 137 135 - 145 mmol/L   Potassium 3.7 3.5 - 5.1 mmol/L   Chloride 114 (H) 98 - 111  mmol/L   CO2 18 (L) 22 - 32 mmol/L   Glucose, Bld 115 (H) 70 - 99 mg/dL   BUN 18 6 - 20 mg/dL   Creatinine, Ser 6.04 0.61 - 1.24 mg/dL   Calcium 8.5 (L) 8.9 - 10.3 mg/dL   GFR calc non Af Amer >60 >60 mL/min   GFR calc Af Amer >60 >60 mL/min   Anion gap 5 5 - 15    Comment: Performed at [facility], [address] [place], [place] [address]  Glucose, capillary     Status: Abnormal   Collection Time: 04/13/19 11:38 PM  Result Value Ref Range   Glucose-Capillary 101 (H) 70 - 99 mg/dL   Comment 1 Notify RN    Comment 2 Document in Chart   Glucose, capillary     Status: None   Collection Time: 04/14/19  4:24 AM  Result Value Ref Range   Glucose-Capillary 96 70 - 99 mg/dL   Comment 1 Notify RN    Comment 2 Document in Chart   Phosphorus     Status: None   Collection Time: 04/14/19  4:32 AM  Result Value Ref Range   Phosphorus 4.6 2.5 - 4.6 mg/dL    Comment: Performed at [facility], [address], [place], [place] [address]  Magnesium     Status: None   Collection Time: 04/14/19  4:32 AM  Result Value Ref Range   Magnesium 1.9 1.7 - 2.4 mg/dL    Comment: Performed at [facility], [address] [place], [place] [address]  Hepatic function panel     Status: Abnormal   Collection Time: 04/14/19  4:32 AM  Result Value Ref Range   Total Protein 6.6 6.5 - 8.1 g/dL   Albumin 2.6 (L) 3.5 - 5.0 g/dL   AST 51 (H) 15 - 41 U/L   ALT 33 0 - 44 U/L   Alkaline Phosphatase 106 38 - 126 U/L   Total Bilirubin 1.5 (H) 0.3 - 1.2 mg/dL   Bilirubin, Direct 0.6 (H) 0.0 - 0.2 mg/dL   Indirect Bilirubin 0.9 0.3 - 0.9 mg/dL    Comment: Performed at [facility], [address], [place], [place] [address]  Ammonia     Status: Abnormal   Collection Time: 04/14/19  4:32 AM  Result Value Ref Range   Ammonia 62 (H) 9 - 35 umol/L    Comment: Performed at [facility], [address] [place], [place]  [address]  Blood gas, arterial     Status: Abnormal   Collection  Time: 04/14/19  5:20 AM  Result Value Ref Range   FIO2 40.00    Delivery systems VENTILATOR    Mode PRESSURE REGULATED VOLUME CONTROL    VT 560 mL   LHR 16 resp/min   Peep/cpap 5.0 cm H20   pH, Arterial 7.447 7.350 - 7.450   pCO2 arterial 28.6 (L) 32.0 - 48.0 mmHg   pO2, Arterial 170 (H) 83.0 - 108.0 mmHg   Bicarbonate 19.4 (L) 20.0 - 28.0 mmol/L   Acid-base deficit 3.9 (H) 0.0 - 2.0 mmol/L   O2 Saturation 99.5 %   Patient temperature 98.6    Collection site RIGHT RADIAL    Drawn by [id number]    Sample type ARTERIAL DRAW    Allens test (pass/fail) PASS PASS    Comment: Performed at [facility], [address], [place], [place] [address]  CBC     Status: Abnormal   Collection Time: 04/14/19  5:41 AM  Result Value Ref Range   WBC 5.1 4.0 - 10.5 K/uL   RBC 2.95 (L) 4.22 - 5.81 MIL/uL   Hemoglobin 9.4 (L) 13.0 - 17.0 g/dL   HCT 13.0 (L) 86.5 - 78.4 %   MCV 98.3 80.0 - 100.0 fL   MCH 31.9 26.0 - 34.0 pg   MCHC 32.4 30.0 - 36.0 g/dL   RDW 69.6 (H) 29.5 - 28.4 %   Platelets 116 (L) 150 - 400 K/uL    Comment: PLATELET COUNT CONFIRMED BY SMEAR Immature Platelet Fraction may be clinically indicated, consider ordering this additional test [id number]    nRBC 0.0 0.0 - 0.2 %    Comment: Performed at [facility], [address], [place], [place] [address]  Glucose, capillary     Status: Abnormal   Collection Time: 04/14/19  7:52 AM  Result Value Ref Range   Glucose-Capillary 101 (H) 70 - 99 mg/dL  Glucose, capillary     Status: None   Collection Time: 04/14/19 11:25 AM  Result Value Ref Range   Glucose-Capillary 88 70 - 99 mg/dL  Urine culture     Status: None   Collection Time: 04/14/19  3:38 PM  Result Value Ref Range   Specimen Description URINE, CATHETERIZED    Special Requests NONE    Culture      NO GROWTH Performed at [facility], [address] [place], [place] [address]    Report Status 04/15/2019 FINAL   Glucose, capillary     Status: None    Collection Time: 04/14/19  3:47 PM  Result Value Ref Range   Glucose-Capillary 89 70 - 99 mg/dL  Glucose, capillary     Status: None   Collection Time: 04/14/19  8:28 PM  Result Value Ref Range   Glucose-Capillary 85 70 - 99 mg/dL  Glucose, capillary     Status: None   Collection Time: 04/15/19 12:04 AM  Result Value Ref Range   Glucose-Capillary 87 70 - 99 mg/dL  Glucose, capillary     Status: None   Collection Time: 04/15/19  3:34 AM  Result Value Ref Range   Glucose-Capillary 90 70 - 99 mg/dL  Glucose, capillary     Status: None   Collection Time: 04/15/19  8:00 AM  Result Value Ref Range   Glucose-Capillary 96 70 - 99 mg/dL  Glucose, capillary     Status: None   Collection Time: 04/15/19 11:29  AM  Result Value Ref Range   Glucose-Capillary 94 70 - 99 mg/dL    Current Facility-Administered Medications  Medication Dose Route Frequency Provider Last Rate Last Dose  . Chlorhexidine Gluconate Cloth 2 % PADS 6 each  6 each Topical Q0600 Coralyn Helling, MD   6 each at 04/15/19 0445  . dexmedetomidine (PRECEDEX) 400 MCG/100ML (4 mcg/mL) infusion  0.4-1.2 mcg/kg/hr Intravenous Titrated Lynnell Catalan, MD 22 mL/hr at 04/15/19 1000 1 mcg/kg/hr at 04/15/19 1000  . enoxaparin (LOVENOX) injection 40 mg  40 mg Subcutaneous Q24H Agarwala, Daleen Bo, MD   40 mg at 04/15/19 1020  . fentaNYL (SUBLIMAZE) injection 50 mcg  50 mcg Intravenous Q15 min PRN Simonne Martinet, NP      . fentaNYL (SUBLIMAZE) injection 50-200 mcg  50-200 mcg Intravenous Q30 min PRN Simonne Martinet, NP      . lactated ringers infusion   Intravenous Continuous Maurilio Lovely D, DO 75 mL/hr at 04/15/19 1000    . lactulose (CHRONULAC) 10 GM/15ML solution 20 g  20 g Per Tube TID Sherryll Burger, Pratik D, DO   20 g at 04/15/19 1035  . LORazepam (ATIVAN) injection 1 mg  1 mg Intravenous Q6H Shah, Pratik D, DO   1 mg at 04/15/19 1255  . LORazepam (ATIVAN) injection 1 mg  1 mg Intravenous Q4H PRN Sherryll Burger, Pratik D, DO      . midazolam (VERSED)  injection 1 mg  1 mg Intravenous Q1H PRN Simonne Martinet, NP   1 mg at 04/14/19 0530  . mupirocin ointment (BACTROBAN) 2 % 1 application  1 application Nasal BID Coralyn Helling, MD   1 application at 04/15/19 1034    Musculoskeletal: Strength & Muscle Tone: within normal limits Gait & Station: UTA since patient is sitting in a chair.  Patient leans: N/A  Psychiatric Specialty Exam: Physical Exam  Nursing note and vitals reviewed. Constitutional: He is oriented to person, place, and time. He appears well-developed and well-nourished.  HENT:  Head: Normocephalic and atraumatic.  Neck: Normal range of motion.  Respiratory: Effort normal.  Musculoskeletal: Normal range of motion.  Neurological: He is alert and oriented to person, place, and time.  Psychiatric: His speech is normal and behavior is normal. Judgment and thought content normal. Cognition and memory are normal. He exhibits a depressed mood.    Review of Systems  Cardiovascular: Positive for chest pain.  Gastrointestinal: Negative for abdominal pain, constipation, diarrhea, nausea and vomiting.  Musculoskeletal:       Bilateral leg pain  Psychiatric/Behavioral: Positive for depression. Negative for hallucinations, substance abuse and suicidal ideas. The patient does not have insomnia.   All other systems reviewed and are negative.   Blood pressure (!) 85/57, pulse 64, temperature 98.6 F (37 C), temperature source Axillary, resp. rate (!) 27, height  (1.753 m), weight 87.6 kg, SpO2 99 %.Body mass index is 28.52 kg/m.  General Appearance: Fairly Groomed, middle aged, Caucasian male, wearing a hospital gown who is sitting in a chair. NAD.   Eye Contact:  Good  Speech:  Clear and Coherent and Normal Rate although raspy voice.  Volume:  Normal  Mood:  Depressed  Affect:  Congruent and Tearful  Thought Process:  Goal Directed, Linear and Descriptions of Associations: Intact  Orientation:  Full (Time, Place, and Person)   Thought Content:  Logical  Suicidal Thoughts:  No  Homicidal Thoughts:  No  Memory:  Immediate;   Good Recent;   Good Remote;   Good  Judgement:  Fair  Insight:  Fair  Psychomotor Activity:  Normal  Concentration:  Concentration: Good and Attention Span: Good  Recall:  Good  Fund of Knowledge:  Good  Language:  Good  Akathisia:  No  Handed:  Right  AIMS (if indicated):   N/A  Assets:  Communication Skills Housing Resilience Social Support  ADL's:  Intact  Cognition:  WNL  Sleep:   Okay   Assessment:  Taylor Olson is a 58 y.o. male who was admitted with acute toxic/metabolic encephalopathy and concern for suicide attempt by overdose. Patient admits to suicide attempt by Baclofen overdose in the setting of worsening depression due to separation from his wife. He denies a prior history of suicide attempts. He warrants inpatient psychiatric hospitalization due to the severity of suicide attempt and ongoing stressors.   Treatment Plan Summary: -Patient warrants inpatient psychiatric hospitalization given high risk of harm to self. -Continue bedside sitter.  -Will defer medication management to inpatient psychiatry team since patient is not open to starting medication for depression at this time.  -EKG reviewed and QTc 464 on 4/20. Please closely monitor when starting or increasing QTc prolonging agents.  -Please pursue involuntary commitment if patient refuses voluntary psychiatric hospitalization or attempts to leave the hospital.  -Will sign off on patient at this time. Please consult psychiatry again as needed.      Disposition: Recommend psychiatric Inpatient admission when medically cleared.  Cherly Beach, DO 04/17/2019 11:32 AM

## 2019-04-15 NOTE — Consult Note (Signed)
Patient is unable to participate in meaningful interview due to sedation with Precedex. Please reconsult psychiatry when patient is able to participate in interview.   Juanetta Beets, DO 04/15/19 3:53 PM

## 2019-04-15 NOTE — Progress Notes (Signed)
PROGRESS NOTE    Taylor Olson  CZY:[id number] DOB: [birth year] DOA: 04/13/2019 PCP: No primary care provider on file.   Brief Narrative:  Per HPI: This is a 58 year old male who presented to [facility] after being found with altered mental status. On ER eval had rapid pupillary changes (dilating and constricting), fluctuant mental status between awake and unresponsive,apparently takes baclofen, and gabapentinand was found w/ empty bottle of flexeril near him on EMS arrival In ER he was intubated. Per emergency room report was found in vomit. Dx eval in ER  CT brain neg + metabolic acidosis,CBC was unremarkable mild metabolic acidosis with anion gap of 13, lactic acid of 1.8 mildly elevated chloride otherwise normal blood chemistry. Ammonia level 35  Patient was admitted with acute toxic and metabolic encephalopathy along with acute respiratory failure secondary to ineffective airway protection. He was extubated on 4/20, but had to be placed back on Precedex that afternoon due to agitation/behavioral disturbances. He appears to be depressed and claims his wife left him due to his illness. Psychiatry consultation placed. Will plan for Precedex weaning today and diet advancement with removal of NGT.  Assessment & Plan:   Active Problems:   Acute metabolic encephalopathy   1. Acute toxic/metabolic encephalopathy-improved.  Presumed secondary to drug overdose intentional versus unintentional.  No signs of seizure activity noted on EEG which was completed on 4/19.  Patient also noted to have mild ammonia elevation, but doubtful that this is the full picture.  Extubated 4/20. Will ask Psych to eval today and wean Precedex with use of Ativan. 2. Acute respiratory failure secondary to above-resolved.  3. History of alcoholic cirrhosis.  Continue to trend LFTs and ammonia levels.  Maintain on lactulose. 4. Mild anemia.  Noted to be stable on CBC.  No overt bleeding identified.  Recheck labs in  a.m.   DVT prophylaxis:SCDs Code Status: Full Family Communication: Will attempt to call family Disposition Plan: Psych consulted today for evaluation of suicidal intent and depression. Wean Precedex. Remove NGT and advance diet to clears.   Consultants:   PCCM  Procedures:   Intubation  Extubation 4/20  Antimicrobials:   None  Subjective: Patient seen and evaluated today with no new acute complaints or concerns. No acute concerns or events noted overnight after Precedex started yesterday afternoon. He is somewhat more cooperative this am and is asking for ice chips.  Objective: Vitals:   04/15/19 0530 04/15/19 0600 04/15/19 0630 04/15/19 0700  BP: 96/61 100/61 (!) 90/53 (!) 85/57  Pulse: 66 67 66 65  Resp: (!) 25 (!) 27 (!) 25 16  Temp:      TempSrc:      SpO2: 100% 100% 100% 99%  Weight:      Height:        Intake/Output Summary (Last 24 hours) at 04/15/2019 0732 Last data filed at 04/15/2019 0600 Gross per 24 hour  Intake 3375.01 ml  Output 1115 ml  Net 2260.01 ml   Filed Weights   04/13/19 1500 04/14/19 0500 04/15/19 0309  Weight: 88 kg 88.1 kg 87.6 kg    Examination:  General exam: Appears calm and comfortable  Respiratory system: Clear to auscultation. Respiratory effort normal. Cardiovascular system: S1 & S2 heard, RRR. No JVD, murmurs, rubs, gallops or clicks. No pedal edema. Gastrointestinal system: Abdomen is nondistended, soft and nontender. No organomegaly or masses felt. Normal bowel sounds heard. Central nervous system: Alert and awake. Extremities: Symmetric 5 x 5 power. Skin: No rashes, lesions  or ulcers Psychiatry: Diminished mood and affect. Outbursts at staff.    Data Reviewed: I have personally reviewed following labs and imaging studies  CBC: Recent Labs  Lab 04/13/19 1418 04/14/19 0541  WBC  --  5.1  HGB 8.8* 9.4*  HCT 26.0* 29.0*  MCV  --  98.3  PLT  --  116*   Basic Metabolic Panel: Recent Labs  Lab 04/13/19  1418 04/13/19 1507 04/13/19 2307 04/14/19 0432  NA 143 140 137  --   K 4.0 4.0 3.7  --   CL  --  112* 114*  --   CO2  --  19* 18*  --   GLUCOSE  --  111* 115*  --   BUN  --  17 18  --   CREATININE  --  0.73 0.82  --   CALCIUM  --  8.9 8.5*  --   MG  --  2.0  --  1.9  PHOS  --  4.8*  --  4.6   GFR: Estimated Creatinine Clearance: 107.6 mL/min (by C-G formula based on SCr of 0.82 mg/dL). Liver Function Tests: Recent Labs  Lab 04/14/19 0432  AST 51*  ALT 33  ALKPHOS 106  BILITOT 1.5*  PROT 6.6  ALBUMIN 2.6*   Recent Labs  Lab 04/13/19 1507  LIPASE 29   Recent Labs  Lab 04/14/19 0432  AMMONIA 62*   Coagulation Profile: Recent Labs  Lab 04/13/19 1507  INR 1.1   Cardiac Enzymes: Recent Labs  Lab 04/13/19 1507  CKTOTAL 13*  CKMB 1.7   BNP (last 3 results) No results for input(s): PROBNP in the last 8760 hours. HbA1C: No results for input(s): HGBA1C in the last 72 hours. CBG: Recent Labs  Lab 04/14/19 1125 04/14/19 1547 04/14/19 2028 04/15/19 0004 04/15/19 0334  GLUCAP 88 89 85 87 90   Lipid Profile: Recent Labs    04/13/19 1507  TRIG 88   Thyroid Function Tests: No results for input(s): TSH, T4TOTAL, FREET4, T3FREE, THYROIDAB in the last 72 hours. Anemia Panel: No results for input(s): VITAMINB12, FOLATE, FERRITIN, TIBC, IRON, RETICCTPCT in the last 72 hours. Sepsis Labs: Recent Labs  Lab 04/13/19 1507 04/13/19 1717  LATICACIDVEN 1.7 1.0    Recent Results (from the past 240 hour(s))  Culture, blood (routine x 2)     Status: None (Preliminary result)   Collection Time: 04/13/19  3:20 PM  Result Value Ref Range Status   Specimen Description BLOOD LEFT ANTECUBITAL  Final   Special Requests   Final    BOTTLES DRAWN AEROBIC ONLY Blood Culture adequate volume   Culture   Final    NO GROWTH < 24 HOURS Performed at [facility], [address] [place], [place] [address]    Report Status PENDING  Incomplete  Culture, blood  (routine x 2)     Status: None (Preliminary result)   Collection Time: 04/13/19  3:30 PM  Result Value Ref Range Status   Specimen Description BLOOD RIGHT HAND  Final   Special Requests   Final    BOTTLES DRAWN AEROBIC ONLY Blood Culture results may not be optimal due to an excessive volume of blood received in culture bottles   Culture   Final    NO GROWTH < 24 HOURS Performed at [facility], [address] [place], [place] [address]    Report Status PENDING  Incomplete  MRSA PCR Screening     Status: Abnormal   Collection  Time: 04/13/19 10:37 PM  Result Value Ref Range Status   MRSA by PCR POSITIVE (A) NEGATIVE Final    Comment:        The GeneXpert MRSA Assay (FDA approved for NASAL specimens only), is one component of a comprehensive MRSA colonization surveillance program. It is not intended to diagnose MRSA infection nor to guide or monitor treatment for MRSA infections. RESULT CALLED TO, READ BACK BY AND VERIFIED WITH: RN K PHILLIPS @0023  04/14/19 BY S GEZAHEGN Performed at [facility], [address] [place], [place] [address]          Radiology Studies: Dg Chest Port 1 View  Result Date: 04/14/2019 CLINICAL DATA:  Acute respiratory failure.  Patient intubated. EXAM: PORTABLE CHEST 1 VIEW COMPARISON:  April 13, 2019 FINDINGS: The ETT is in good position. The NG tube terminates below today's film. No pneumothorax. The cardiomediastinal silhouette is stable. No pulmonary nodules, masses, or focal infiltrates. No acute abnormalities identified. IMPRESSION: 1. Support apparatus as above. 2. No acute abnormality otherwise seen. Electronically Signed   By: Gerome Samavid  Williams III M.D   On: 04/14/2019 07:58   Dg Chest Port 1 View  Result Date: 04/13/2019 CLINICAL DATA:  OG tube placement. EXAM: PORTABLE CHEST 1 VIEW COMPARISON:  None. FINDINGS: UPPER limits normal heart size noted. An endotracheal tube is identified with tip 6 cm above the carina. An OG tube  enters the stomach with tip off the field of view. No definite airspace disease, pleural effusion or pneumothorax. IMPRESSION: 1. No acute abnormality 2. OG tube entering the stomach with tip off the field of view. Electronically Signed   By: Harmon PierJeffrey  Hu M.D.   On: 04/13/2019 13:26        Scheduled Meds: . Chlorhexidine Gluconate Cloth  6 each Topical Q0600  . enoxaparin (LOVENOX) injection  40 mg Subcutaneous Q24H  . lactulose  20 g Per Tube TID  . LORazepam  1 mg Intravenous Q6H  . mupirocin ointment  1 application Nasal BID  . pantoprazole sodium  40 mg Per Tube QHS   Continuous Infusions: . dexmedetomidine (PRECEDEX) IV infusion 1 mcg/kg/hr (04/15/19 [id number])  . lactated ringers 125 mL/hr at 04/15/19 0309     LOS: 2 days    Time spent: 30 minutes    Mellisa Arshad Hoover BrunetteD Samanvitha Germany, DO [facility] Pager [phone]  If 7PM-7AM, please contact night-coverage [email or web address] Password [email or web address] 04/15/2019, 7:32 AM

## 2019-04-16 DIAGNOSIS — G9341 Metabolic encephalopathy: Secondary | ICD-10-CM | POA: Diagnosis not present

## 2019-04-16 LAB — COMPREHENSIVE METABOLIC PANEL
ALT: 30 U/L (ref 0–44)
AST: 42 U/L — ABNORMAL HIGH (ref 15–41)
Albumin: 2.4 g/dL — ABNORMAL LOW (ref 3.5–5.0)
Alkaline Phosphatase: 102 U/L (ref 38–126)
Anion gap: 7 (ref 5–15)
BUN: 22 mg/dL — ABNORMAL HIGH (ref 6–20)
CO2: 20 mmol/L — ABNORMAL LOW (ref 22–32)
Calcium: 8.4 mg/dL — ABNORMAL LOW (ref 8.9–10.3)
Chloride: 113 mmol/L — ABNORMAL HIGH (ref 98–111)
Creatinine, Ser: 0.82 mg/dL (ref 0.61–1.24)
GFR calc Af Amer: >60 mL/min (ref >60)
GFR calc non Af Amer: >60 mL/min (ref >60)
Glucose, Bld: 119 mg/dL — ABNORMAL HIGH (ref 70–99)
Potassium: 3.6 mmol/L (ref 3.5–5.1)
Sodium: 140 mmol/L (ref 135–145)
Total Bilirubin: 1.7 mg/dL — ABNORMAL HIGH (ref 0.3–1.2)
Total Protein: 6.1 g/dL — ABNORMAL LOW (ref 6.5–8.1)

## 2019-04-16 LAB — CBC
HCT: 25.7 % — ABNORMAL LOW (ref 39.0–52.0)
Hemoglobin: 8.2 g/dL — ABNORMAL LOW (ref 13.0–17.0)
MCH: 32.4 pg (ref 26.0–34.0)
MCHC: 31.9 g/dL (ref 30.0–36.0)
MCV: 101.6 fL — ABNORMAL HIGH (ref 80.0–100.0)
Platelets: 87 10*3/uL — ABNORMAL LOW (ref 150–400)
RBC: 2.53 MIL/uL — ABNORMAL LOW (ref 4.22–5.81)
RDW: 15.2 % (ref 11.5–15.5)
WBC: 4 10*3/uL (ref 4.0–10.5)
nRBC: 0 % (ref 0.0–0.2)

## 2019-04-16 LAB — GLUCOSE, CAPILLARY
Glucose-Capillary: 102 mg/dL — ABNORMAL HIGH (ref 70–99)
Glucose-Capillary: 107 mg/dL — ABNORMAL HIGH (ref 70–99)
Glucose-Capillary: 84 mg/dL (ref 70–99)
Glucose-Capillary: 92 mg/dL (ref 70–99)

## 2019-04-16 LAB — AMMONIA: Ammonia: 52 umol/L — ABNORMAL HIGH (ref 9–35)

## 2019-04-16 MED ORDER — LORAZEPAM 1 MG PO TABS
1.0000 mg | ORAL_TABLET | ORAL | Status: DC
  Administered 2019-04-16 – 2019-04-18 (×9): 1 mg via ORAL
  Filled 2019-04-16 (×12): qty 1

## 2019-04-16 MED ORDER — IBUPROFEN 100 MG/5ML PO SUSP
200.0000 mg | Freq: Three times a day (TID) | ORAL | Status: DC | PRN
  Administered 2019-04-16: 22:00:00 200 mg via ORAL
  Filled 2019-04-16 (×4): qty 10

## 2019-04-16 MED ORDER — CLONIDINE HCL 0.3 MG PO TABS
0.3000 mg | ORAL_TABLET | Freq: Four times a day (QID) | ORAL | Status: DC
  Administered 2019-04-16 (×3): 0.3 mg via ORAL
  Filled 2019-04-16 (×4): qty 1

## 2019-04-16 MED ORDER — SODIUM CHLORIDE 0.9 % IV SOLN
INTRAVENOUS | Status: DC | PRN
  Administered 2019-04-16: 17:00:00 via INTRAVENOUS

## 2019-04-16 NOTE — Progress Notes (Signed)
eLink Physician-Brief Progress Note Patient Name: Taylor Olson DOB: 04-22-1961 MRN: [id number]   Date of Service  04/16/2019  HPI/Events of Note  Patient seen trying to get out of bed  eICU Interventions  4 point restraints renewed     Intervention Category Minor Interventions: Agitation / anxiety - evaluation and management  Darl Pikes 04/16/2019, 12:34 AM

## 2019-04-16 NOTE — TOC Initial Note (Signed)
Transition of Care [facility]) - Initial/Assessment Note    Patient Details  Name: Taylor Olson MRN: [id number] Date of Birth: 03/05/1961  [facility]) CM/SW Contact:    Bess Kinds, RN Phone Number: [phone] 04/16/2019, 1:29 PM  Clinical Narrative:                 Noted in chart that patient has been staying in [place] for the last week with his brother in law. Prior to this patient was in [place], [place] where he lived in a camper until he was hospitalized for syncope this month at [facility]. Patient discharged on Good Friday, 04/04/2019, and his brother in law - Taylor Olson - drove him to his home in [place]. Patient was rehospitalized at [facility] for syncope then admitted to [facility].   Spoke with patient briefly at bedside. He was much calmer compared to behavior noted in earlier notes. It was difficult to communicate d/t hoarseness. 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 He stated that he has been to [place] in [place], [place]. Discussed with patient that Taylor Olson was aware of his hospitalization, and he wanted to talk to [place] on the phone. CM was unable to reach [place] at [phone] but left a voice mail. Patient expressed concern about Taylor Olson not answering his phone stating that he always answers. Patient made a comment, "I don't want to go to hell." Offered chaplain service, but patient wanted to speak to his brother who is a Education officer, environmental.  Telephone call to [facility], [place] who stated that patient had not been there since 2017 and does not have a doctor there any more. They advised that patient needed to [facility] with [facility].   Telephone call to [facility] [phone]) seeking information to provide to patient about registering. Advised that it was unclear if patient was service connected stating that he was active duty for about a year in 1981. Advised that the patient should call this same number to  complete an application.   CM to continue to follow for transition of care needs.    Barriers to Discharge: Continued Medical Work up   Patient Goals and CMS Choice        Expected Discharge Plan and Services     Discharge Planning Services: CM Consult                              Prior Living Arrangements/Services   Lives with:: Self, Other (Comment)(brother in law - Taylor Olson)                   Activities of Daily Living      Permission Sought/Granted   Permission granted to share information with : Yes, Verbal Permission Granted  Share Information with NAME: Taylor Olson     Permission granted to share info w Relationship: brother in law  Permission granted to share info w Contact Information: [phone]  Emotional Assessment Appearance:: Appears older than stated age         Psych Involvement: Yes (comment)  Admission diagnosis:  SEIZURES Patient Active Problem List   Diagnosis Date Noted  . Acute metabolic encephalopathy 04/13/2019   PCP:  No primary care provider on file. Pharmacy:  No Pharmacies Listed    Social Determinants of Health (SDOH) Interventions    Readmission Risk Interventions No flowsheet data found.

## 2019-04-16 NOTE — Progress Notes (Signed)
eLink Physician-Brief Progress Note Patient Name: Taylor Olson DOB: 1961-08-19 MRN: [id number]   Date of Service  04/16/2019  HPI/Events of Note  Non specific right lower ext musculoskeletal discomfort, no swelling or bruising, no observed difficulty with ROM, creatinine WNL  eICU Interventions  Ibuprofen 200 mg po tid prn        Migdalia Dk 04/16/2019, 9:55 PM

## 2019-04-16 NOTE — Progress Notes (Signed)
   04/16/19 1500  Clinical Encounter Type  Visited With Patient  Visit Type Initial;Spiritual support  Referral From Nurse  Consult/Referral To Chaplain  Spiritual Encounters  Spiritual Needs Emotional;Prayer;Literature  Stress Factors  Patient Stress Factors Health changes;Family relationships;Major life changes   While rounding floor, Nurse requested a Chaplain consult for PT. PT has a sitter present and I entered room. PT was alert and slurred in his speech. Pt stated regret for his actions as he was sharing with me. After his openness he became happy for the Chaplain presence and time spent with him. I offered spiritual care with words of comfort, empathic listening, prayer. PT requested a soft cover Bible and I gave him one.  Chaplain Orest Dikes  [phone]

## 2019-04-16 NOTE — Progress Notes (Signed)
NAME:  Taylor PorterRobert Apachito, MRN:  [id number], DOB:  05-23-1961, LOS: 3 ADMISSION DATE:  04/13/2019, CONSULTATION DATE:  04/13/2019 [facility] COMPLA[facility]: Flexeril overdose  HPI/course in hospital  Found with altered mental status.  Empty Flexeril bottle found near him.  Also takes baclofen and gabapentin.  Intubated for airway protection.  Past Medical History  No past medical history on file.    Interim history/subjective:  Increasingly interactive and following commands per Rn  Objective   Blood pressure (!) 89/61, pulse 60, temperature 98.1 F (36.7 C), temperature source Axillary, resp. rate (!) 26, height 5\' 9"  (1.753 m), weight 87.2 kg, SpO2 99 %.        Intake/Output Summary (Last 24 hours) at 04/16/2019 1212 Last data filed at 04/16/2019 1100 Gross per 24 hour  Intake 1339.37 ml  Output 525 ml  Net 814.37 ml   Filed Weights   04/14/19 0500 04/15/19 0309 04/16/19 0500  Weight: 88.1 kg 87.6 kg 87.2 kg    Examination: General: thin, calm and in restraints Neuro: responses slow but appropriate, following commands with no focality HEENT: extubated, no pressure ulceration, dry mucous membranes. Chest: no distress. Normal vesicular breath sounds. CV: HS normal, no edema. Abdomen: soft.  GU: urinary catheter in place Skin/MSK: line sites intact. No trauma.  Ancillary tests (personally reviewed)  CBC: Recent Labs  Lab 04/13/19 1418 04/14/19 0541 04/16/19 0627  WBC  --  5.1 4.0  HGB 8.8* 9.4* 8.2*  HCT 26.0* 29.0* 25.7*  MCV  --  98.3 101.6*  PLT  --  116* 87*    Basic Metabolic Panel: Recent Labs  Lab 04/13/19 1418 04/13/19 1507 04/13/19 2307 04/14/19 0432 04/16/19 0627  NA 143 140 137  --  140  K 4.0 4.0 3.7  --  3.6  CL  --  112* 114*  --  113*  CO2  --  19* 18*  --  20*  GLUCOSE  --  111* 115*  --  119*  BUN  --  17 18  --  22*  CREATININE  --  0.73 0.82  --  0.82  CALCIUM  --  8.9 8.5*  --  8.4*  MG  --  2.0  --  1.9  --   PHOS  --   4.8*  --  4.6  --    GFR: Estimated Creatinine Clearance: 107.4 mL/min (by C-G formula based on SCr of 0.82 mg/dL). Recent Labs  Lab 04/13/19 1507 04/13/19 1717 04/14/19 0541 04/16/19 0627  WBC  --   --  5.1 4.0  LATICACIDVEN 1.7 1.0  --   --     Liver Function Tests: Recent Labs  Lab 04/14/19 0432 04/16/19 0627  AST 51* 42*  ALT 33 30  ALKPHOS 106 102  BILITOT 1.5* 1.7*  PROT 6.6 6.1*  ALBUMIN 2.6* 2.4*   Recent Labs  Lab 04/13/19 1507  LIPASE 29   Recent Labs  Lab 04/14/19 0432 04/16/19 0627  AMMONIA 62* 52*    ABG    Component Value Date/Time   PHART 7.447 04/14/2019 0520   PCO2ART 28.6 (L) 04/14/2019 0520   PO2ART 170 (H) 04/14/2019 0520   HCO3 19.4 (L) 04/14/2019 0520   TCO2 22 04/13/2019 1418   ACIDBASEDEF 3.9 (H) 04/14/2019 0520   O2SAT 99.5 04/14/2019 0520     Coagulation Profile: Recent Labs  Lab 04/13/19 1507  INR 1.1    Cardiac Enzymes: Recent Labs  Lab 04/13/19 1507  CKTOTAL 13*  CKMB 1.7    HbA1C: No results found for: HGBA1C  CBG: Recent Labs  Lab 04/15/19 0334 04/15/19 0800 04/15/19 1129 04/15/19 1532 04/16/19 1119  GLUCAP 90 96 94 98 92     Assessment & Plan:   Active issues:    Was critically ill due to acute respiratory failure secondary to loss of airway protection.  Tolerated extubation but remains at risk for reintubation due to poor mental status. Continue to monitor and encourage mobilization.    Acute toxic encephalopathy secondary to drug overdose.  Intentionality unknown. Continue to wean dexmedetomidine.   Transition to enteral clonidine and lorazepam. Allow time for original ingestants to clear.  Alcoholic cirrhosis by history. Resume lactulose.  Other issues:  Mild anemia. Observe.  Best practice:  Diet: NPO until mental status improves. Tube feeding.  Pain/Anxiety/Delirium protocol (if indicated): dexmedetomidine weaning to off VAP protocol (if indicated): encourage pulmonary toilette  DVT prophylaxis: Enoxaparin GI prophylaxis: Pantoprazole Urinary catheter: Switch to external catheter. Glucose control: Euglycemic Mobility: Bedrest Code Status: Full code Family Communication: No family update Disposition: Transfer once off Precedex.   Lynnell Catalan, MD [facility] ICU Physician [facility]  Pager: [phone] Mobile: [phone] After hours: [phone].  04/16/2019, 12:12 PM

## 2019-04-17 DIAGNOSIS — F322 Major depressive disorder, single episode, severe without psychotic features: Secondary | ICD-10-CM

## 2019-04-17 LAB — GLUCOSE, CAPILLARY
Glucose-Capillary: 93 mg/dL (ref 70–99)
Glucose-Capillary: 85 mg/dL (ref 70–99)
Glucose-Capillary: 108 mg/dL — ABNORMAL HIGH (ref 70–99)
Glucose-Capillary: 84 mg/dL (ref 70–99)
Glucose-Capillary: 107 mg/dL — ABNORMAL HIGH (ref 70–99)

## 2019-04-17 MED ORDER — CLONIDINE HCL 0.1 MG PO TABS
0.1000 mg | ORAL_TABLET | Freq: Four times a day (QID) | ORAL | Status: DC
  Filled 2019-04-17 (×2): qty 1

## 2019-04-17 MED ORDER — FAMOTIDINE 40 MG/5ML PO SUSR
20.0000 mg | Freq: Two times a day (BID) | ORAL | Status: DC
  Administered 2019-04-17: 20 mg via ORAL
  Filled 2019-04-17 (×4): qty 2.5

## 2019-04-17 MED ORDER — MENTHOL 3 MG MT LOZG
1.0000 | LOZENGE | OROMUCOSAL | Status: DC | PRN
  Administered 2019-04-17 – 2019-04-18 (×2): 3 mg via ORAL
  Filled 2019-04-17 (×2): qty 9

## 2019-04-17 MED ORDER — CLONIDINE HCL 0.2 MG PO TABS
0.2000 mg | ORAL_TABLET | Freq: Four times a day (QID) | ORAL | Status: AC
  Administered 2019-04-17 – 2019-04-18 (×3): 0.2 mg via ORAL
  Filled 2019-04-17 (×3): qty 1

## 2019-04-17 MED ORDER — LACTULOSE 10 GM/15ML PO SOLN
20.0000 g | Freq: Three times a day (TID) | ORAL | Status: DC
  Administered 2019-04-17 (×2): 20 g via ORAL
  Filled 2019-04-17 (×5): qty 30

## 2019-04-17 MED ORDER — CLONIDINE HCL 0.1 MG PO TABS: 0.1000 mg | ORAL_TABLET | Freq: Two times a day (BID) | ORAL | Status: DC

## 2019-04-17 MED ORDER — CLONIDINE HCL 0.1 MG PO TABS: 0.1000 mg | ORAL_TABLET | Freq: Every day | ORAL | Status: DC

## 2019-04-17 NOTE — Progress Notes (Signed)
NAME:  Taylor Olson, MRN:  [id number], DOB:  [birth year], LOS: 4 ADMISSION DATE:  04/13/2019, CONSULTATION DATE:  04/13/2019 REFERRING MD:  [facility] ED, CHIEF COMPLAINT: Flexeril overdose  HPI/course in hospital  Found with altered mental status.  Empty Flexeril bottle found near him.  Also takes baclofen and gabapentin.  Intubated for airway protection.  Past Medical History  No past medical history on file.    Significant studies:  None MICRO DATA:  Blood 4/19: ngtd Urine 4/19: neg Abx:  none  Interim history/subjective:  Awake, alert and following commands.  positive ~5L since admit, would benefit from diuresis if BP allows once off clonidine. C/o sore throat at this time, cepacol ordered  Objective   Blood pressure (!) 87/53, pulse 72, temperature 98.6 F (37 C), temperature source Oral, resp. rate (!) 25, height 5\' 9"  (1.753 m), weight 91.8 kg, SpO2 99 %.    FiO2 (%):  [40 %] 40 %   Intake/Output Summary (Last 24 hours) at 04/17/2019 1116 Last data filed at 04/17/2019 0740 Gross per 24 hour  Intake 1062.13 ml  Output 550 ml  Net 512.13 ml   Filed Weights   04/15/19 0309 04/16/19 0500 04/17/19 0435  Weight: 87.6 kg 87.2 kg 91.8 kg    Examination: General: thin, calm and resting comfortably in bed Neuro: responses slow but appropriate, following commands with no focality HEENT: ncat, no pressure ulceration, dry mucous membranes. Chest: no distress. Normal vesicular breath sounds. CV: rrr, s1/s2 Abdomen: soft.  GU: no abnormalities  Skin/MSK: line sites intact. No trauma. No edema  Ancillary tests (personally reviewed)  CBC: Recent Labs  Lab 04/13/19 1418 04/14/19 0541 04/16/19 0627  WBC  --  5.1 4.0  HGB 8.8* 9.4* 8.2*  HCT 26.0* 29.0* 25.7*  MCV  --  98.3 101.6*  PLT  --  116* 87*    Basic Metabolic Panel: Recent Labs  Lab 04/13/19 1418 04/13/19 1507 04/13/19 2307 04/14/19 0432 04/16/19 0627  NA 143 140 137  --  140  K 4.0 4.0 3.7  --  3.6  CL  --   112* 114*  --  113*  CO2  --  19* 18*  --  20*  GLUCOSE  --  111* 115*  --  119*  BUN  --  17 18  --  22*  CREATININE  --  0.73 0.82  --  0.82  CALCIUM  --  8.9 8.5*  --  8.4*  MG  --  2.0  --  1.9  --   PHOS  --  4.8*  --  4.6  --    GFR: Estimated Creatinine Clearance: 109.9 mL/min (by C-G formula based on SCr of 0.82 mg/dL). Recent Labs  Lab 04/13/19 1507 04/13/19 1717 04/14/19 0541 04/16/19 0627  WBC  --   --  5.1 4.0  LATICACIDVEN 1.7 1.0  --   --     Liver Function Tests: Recent Labs  Lab 04/14/19 0432 04/16/19 0627  AST 51* 42*  ALT 33 30  ALKPHOS 106 102  BILITOT 1.5* 1.7*  PROT 6.6 6.1*  ALBUMIN 2.6* 2.4*   Recent Labs  Lab 04/13/19 1507  LIPASE 29   Recent Labs  Lab 04/14/19 0432 04/16/19 0627  AMMONIA 62* 52*    ABG    Component Value Date/Time   PHART 7.447 04/14/2019 0520   PCO2ART 28.6 (L) 04/14/2019 0520   PO2ART 170 (H) 04/14/2019 0520   HCO3 19.4 (L) 04/14/2019 0160  TCO2 22 04/13/2019 1418   ACIDBASEDEF 3.9 (H) 04/14/2019 0520   O2SAT 99.5 04/14/2019 0520     Coagulation Profile: Recent Labs  Lab 04/13/19 1507  INR 1.1    Cardiac Enzymes: Recent Labs  Lab 04/13/19 1507  CKTOTAL 13*  CKMB 1.7    HbA1C: No results found for: HGBA1C  CBG: Recent Labs  Lab 04/16/19 1533 04/16/19 1948 04/16/19 2347 04/17/19 0327 04/17/19 0813  GLUCAP 84 102* 107* 93 85     Assessment & Plan:   Active issues:    Acute resp failure:  - Was critically ill due to acute respiratory failure secondary to loss of airway protection.  -Tolerated extubation 4/20 - Continue to monitor and encourage mobilization. -consult pt today   Acute toxic encephalopathy secondary to drug overdose.  Intentionality unknown. Off precedex Transitioning to enteral clonidine and lorazepam. Allow time for original ingestants to clear. Psych consult ordered today.   Alcoholic cirrhosis by history. cont lactulose. Ammonia: 52, recheck tomorrow   Other issues:  Mild anemia. Observe.  Best practice:  Diet: po as tolerated, clears for now Pain/Anxiety/Delirium protocol (if indicated): clonidine wean in VAP protocol (if indicated): encourage pulmonary toilette DVT prophylaxis: Enoxaparin GI prophylaxis: famotidine Urinary catheter: voiding independently Glucose control: Euglycemic Mobility: Pt ordered Code Status: Full code Family Communication: No family update Disposition: CCM to sign off 4/24am and [facility] to pick up care at that time. Med/tele ordered   Briant SitesJessica Emillia Weatherly, DO ICU Physician [facility]  Critical [facility]  After hours: [phone].  04/17/2019, 11:16 AM

## 2019-04-17 NOTE — Evaluation (Signed)
Physical Therapy Evaluation Patient Details Name: Taylor Olson MRN: [id number] DOB: November 01, 1961 Today's Date: 04/17/2019   History of Present Illness  Pt is a 58 y/o male transferred from [facility] secondary to acute toxic/metabolic encephalopathy.  Presumed secondary to drug overdose intentional versus unintentional. Pt intubated upon arrival and was extubated 4/20. PMH includes alcoholic cirrhosis, PE, COPD, portal HTN, and Hepatitis A.   Clinical Impression  Pt admitted secondary to problem above with deficits below. Pt unsteady and requiring min A for mobility tasks, even with use of RW. Pt very flat and reports he felt somewhat groggy; unsure of baseline. Feel pt is at increased risk for falls, however, will likely progress well once grogginess improves. Will continue to follow acutely to maximize functional mobility independence and safety.     Follow Up Recommendations Other (comment);Supervision for mobility/OOB(TBD; may need inpatient psych placement)    Equipment Recommendations  Rolling walker with 5" wheels    Recommendations for Other Services       Precautions / Restrictions Precautions Precautions: Fall;Other (comment) Precaution Comments: suicide sitter Restrictions Weight Bearing Restrictions: No      Mobility  Bed Mobility Overal bed mobility: Modified Independent                Transfers Overall transfer level: Needs assistance Equipment used: None;Rolling walker (2 wheeled) Transfers: Sit to/from Stand Sit to Stand: Min guard         General transfer comment: Min guard for steadying assist.   Ambulation/Gait Ambulation/Gait assistance: Min assist;Min guard Gait Distance (Feet): 50 Feet Assistive device: Rolling walker (2 wheeled);None Gait Pattern/deviations: Step-through pattern;Decreased stride length;Staggering left;Staggering right;Drifts right/left Gait velocity: Decreased    General Gait Details: Slow, unsteady gait, even with  use of RW. Pt drifting to R and L and pt with LOB X3, requiring min A for steadying. Required safety cues for use of RW. SOB noted, however, oxygen sats >90% on RA.   Stairs            Wheelchair Mobility    Modified Rankin (Stroke Patients Only)       Balance Overall balance assessment: Needs assistance Sitting-balance support: No upper extremity supported;Feet supported Sitting balance-Leahy Scale: Good     Standing balance support: Bilateral upper extremity supported;During functional activity;No upper extremity supported Standing balance-Leahy Scale: Poor Standing balance comment: Reliant on BUE and external support.                              Pertinent Vitals/Pain Pain Assessment: Faces Faces Pain Scale: Hurts little more Pain Location: R thigh  Pain Descriptors / Indicators: Burning Pain Intervention(s): Limited activity within patient's tolerance;Monitored during session;Repositioned    Home Living Family/patient expects to be discharged to:: Private residence Living Arrangements: Other relatives(sister) Available Help at Discharge: Family;Available 24 hours/day Type of Home: House Home Access: Stairs to enter Entrance Stairs-Rails: (has rail but unsure of what side) Entrance Stairs-Number of Steps: 2 Home Layout: One level Home Equipment: None      Prior Function Level of Independence: Independent               Hand Dominance        Extremity/Trunk Assessment   Upper Extremity Assessment Upper Extremity Assessment: Generalized weakness    Lower Extremity Assessment Lower Extremity Assessment: Generalized weakness    Cervical / Trunk Assessment Cervical / Trunk Assessment: Normal  Communication   Communication: No difficulties  Cognition Arousal/Alertness:  Awake/alert Behavior During Therapy: Flat affect Overall Cognitive Status: No family/caregiver present to determine baseline cognitive functioning                                  General Comments: Pt very flat throughout session. Somewhat groggy.       General Comments      Exercises     Assessment/Plan    PT Assessment Patient needs continued PT services  PT Problem List Decreased strength;Decreased balance;Decreased mobility;Decreased knowledge of use of DME;Decreased knowledge of precautions;Decreased safety awareness       PT Treatment Interventions DME instruction;Gait training;Stair training;Functional mobility training;Therapeutic activities;Therapeutic exercise;Balance training;Patient/family education    PT Goals (Current goals can be found in the Care Plan section)  Acute Rehab PT Goals Patient Stated Goal: to go home PT Goal Formulation: With patient Time For Goal Achievement: 05/01/19 Potential to Achieve Goals: Good    Frequency Min 3X/week   Barriers to discharge        Co-evaluation               AM-PAC PT "6 Clicks" Mobility  Outcome Measure Help needed turning from your back to your side while in a flat bed without using bedrails?: None Help needed moving from lying on your back to sitting on the side of a flat bed without using bedrails?: None Help needed moving to and from a bed to a chair (including a wheelchair)?: A Little Help needed standing up from a chair using your arms (e.g., wheelchair or bedside chair)?: A Little Help needed to walk in hospital room?: A Little Help needed climbing 3-5 steps with a railing? : Total 6 Click Score: 18    End of Session Equipment Utilized During Treatment: Gait belt Activity Tolerance: Patient tolerated treatment well Patient left: in bed;with call bell/phone within reach;with nursing/sitter in room Nurse Communication: Mobility status PT Visit Diagnosis: Unsteadiness on feet (R26.81);Muscle weakness (generalized) (M62.81);Other abnormalities of gait and mobility (R26.89)    Time: 1540-1559 PT Time Calculation (min) (ACUTE ONLY): 19  min   Charges:   PT Evaluation $PT Eval Moderate Complexity: 1 Mod          Gladys DammeBrittany Fawna Cranmer, PT, DPT  Acute Rehabilitation Services  Pager: [phone] Office: [phone]   Lehman PromBrittany S Hydee Fleece 04/17/2019, 5:09 PM

## 2019-04-18 ENCOUNTER — Inpatient Hospital Stay (HOSPITAL_COMMUNITY)
Admission: AD | Admit: 2019-04-18 | Discharge: 2019-04-27 | DRG: 885 | Disposition: A | Discharge status: Home / Self Care | Payer: No Typology Code available for payment source | Source: Intra-hospital | Attending: Psychiatry | Admitting: Psychiatry

## 2019-04-18 ENCOUNTER — Encounter (HOSPITAL_COMMUNITY): Payer: Self-pay | Admitting: *Deleted

## 2019-04-18 ENCOUNTER — Other Ambulatory Visit: Payer: Self-pay

## 2019-04-18 DIAGNOSIS — D72819 Decreased white blood cell count, unspecified: Secondary | ICD-10-CM | POA: Diagnosis present

## 2019-04-18 DIAGNOSIS — F332 Major depressive disorder, recurrent severe without psychotic features: Principal | ICD-10-CM | POA: Diagnosis present

## 2019-04-18 DIAGNOSIS — I509 Heart failure, unspecified: Secondary | ICD-10-CM | POA: Diagnosis present

## 2019-04-18 DIAGNOSIS — D649 Anemia, unspecified: Secondary | ICD-10-CM | POA: Diagnosis present

## 2019-04-18 DIAGNOSIS — Z8619 Personal history of other infectious and parasitic diseases: Secondary | ICD-10-CM

## 2019-04-18 DIAGNOSIS — Z22322 Carrier or suspected carrier of Methicillin resistant Staphylococcus aureus: Secondary | ICD-10-CM | POA: Diagnosis not present

## 2019-04-18 DIAGNOSIS — Z86711 Personal history of pulmonary embolism: Secondary | ICD-10-CM

## 2019-04-18 DIAGNOSIS — Z915 Personal history of self-harm: Secondary | ICD-10-CM

## 2019-04-18 DIAGNOSIS — G8929 Other chronic pain: Secondary | ICD-10-CM | POA: Diagnosis present

## 2019-04-18 DIAGNOSIS — F419 Anxiety disorder, unspecified: Secondary | ICD-10-CM | POA: Diagnosis present

## 2019-04-18 DIAGNOSIS — M545 Low back pain: Secondary | ICD-10-CM | POA: Diagnosis present

## 2019-04-18 DIAGNOSIS — J449 Chronic obstructive pulmonary disease, unspecified: Secondary | ICD-10-CM | POA: Diagnosis present

## 2019-04-18 DIAGNOSIS — K7460 Unspecified cirrhosis of liver: Secondary | ICD-10-CM | POA: Diagnosis present

## 2019-04-18 DIAGNOSIS — F322 Major depressive disorder, single episode, severe without psychotic features: Secondary | ICD-10-CM

## 2019-04-18 DIAGNOSIS — K766 Portal hypertension: Secondary | ICD-10-CM | POA: Diagnosis present

## 2019-04-18 DIAGNOSIS — F1020 Alcohol dependence, uncomplicated: Secondary | ICD-10-CM | POA: Diagnosis present

## 2019-04-18 DIAGNOSIS — E7220 Disorder of urea cycle metabolism, unspecified: Secondary | ICD-10-CM | POA: Diagnosis present

## 2019-04-18 DIAGNOSIS — M79604 Pain in right leg: Secondary | ICD-10-CM | POA: Diagnosis present

## 2019-04-18 DIAGNOSIS — R262 Difficulty in walking, not elsewhere classified: Secondary | ICD-10-CM | POA: Diagnosis present

## 2019-04-18 DIAGNOSIS — K219 Gastro-esophageal reflux disease without esophagitis: Secondary | ICD-10-CM | POA: Diagnosis present

## 2019-04-18 DIAGNOSIS — Z9181 History of falling: Secondary | ICD-10-CM | POA: Diagnosis not present

## 2019-04-18 HISTORY — DX: Personal history of other venous thrombosis and embolism: Z86.718

## 2019-04-18 HISTORY — DX: Chronic obstructive pulmonary disease, unspecified: J44.9

## 2019-04-18 HISTORY — DX: Gastro-esophageal reflux disease without esophagitis: K21.9

## 2019-04-18 LAB — COMPREHENSIVE METABOLIC PANEL
ALT: 30 U/L (ref 0–44)
AST: 45 U/L — ABNORMAL HIGH (ref 15–41)
Albumin: 2.3 g/dL — ABNORMAL LOW (ref 3.5–5.0)
Alkaline Phosphatase: 118 U/L (ref 38–126)
Anion gap: 7 (ref 5–15)
BUN: 12 mg/dL (ref 6–20)
CO2: 22 mmol/L (ref 22–32)
Calcium: 8.2 mg/dL — ABNORMAL LOW (ref 8.9–10.3)
Chloride: 110 mmol/L (ref 98–111)
Creatinine, Ser: 0.75 mg/dL (ref 0.61–1.24)
GFR calc Af Amer: >60 mL/min (ref >60)
GFR calc non Af Amer: >60 mL/min (ref >60)
Glucose, Bld: 96 mg/dL (ref 70–99)
Potassium: 3.4 mmol/L — ABNORMAL LOW (ref 3.5–5.1)
Sodium: 139 mmol/L (ref 135–145)
Total Bilirubin: 1 mg/dL (ref 0.3–1.2)
Total Protein: 5.8 g/dL — ABNORMAL LOW (ref 6.5–8.1)

## 2019-04-18 LAB — CULTURE, BLOOD (ROUTINE X 2)
Culture: NO GROWTH
Culture: NO GROWTH
Special Requests: ADEQUATE

## 2019-04-18 LAB — CBC
HCT: 24.6 % — ABNORMAL LOW (ref 39.0–52.0)
Hemoglobin: 8.2 g/dL — ABNORMAL LOW (ref 13.0–17.0)
MCH: 32.5 pg (ref 26.0–34.0)
MCHC: 33.3 g/dL (ref 30.0–36.0)
MCV: 97.6 fL (ref 80.0–100.0)
Platelets: 86 10*3/uL — ABNORMAL LOW (ref 150–400)
RBC: 2.52 MIL/uL — ABNORMAL LOW (ref 4.22–5.81)
RDW: 14.7 % (ref 11.5–15.5)
WBC: 2.9 10*3/uL — ABNORMAL LOW (ref 4.0–10.5)
nRBC: 0 % (ref 0.0–0.2)

## 2019-04-18 LAB — MAGNESIUM: Magnesium: 1.6 mg/dL — ABNORMAL LOW (ref 1.7–2.4)

## 2019-04-18 LAB — GLUCOSE, CAPILLARY
Glucose-Capillary: 86 mg/dL (ref 70–99)
Glucose-Capillary: 93 mg/dL (ref 70–99)
Glucose-Capillary: 109 mg/dL — ABNORMAL HIGH (ref 70–99)
Glucose-Capillary: 91 mg/dL (ref 70–99)

## 2019-04-18 LAB — AMMONIA: Ammonia: 78 umol/L — ABNORMAL HIGH (ref 9–35)

## 2019-04-18 MED ORDER — TRAZODONE HCL 50 MG PO TABS: 50.0000 mg | ORAL_TABLET | Freq: Every evening | ORAL | Status: DC | PRN

## 2019-04-18 MED ORDER — PHENOL 1.4 % MT LIQD: 1.0000 | OROMUCOSAL | 0 refills | Status: AC | PRN

## 2019-04-18 MED ORDER — CLONIDINE HCL 0.1 MG PO TABS: 0.1000 mg | ORAL_TABLET | Freq: Every day | ORAL | Status: DC

## 2019-04-18 MED ORDER — MUPIROCIN 2 % EX OINT: 1.0000 "application " | TOPICAL_OINTMENT | Freq: Two times a day (BID) | CUTANEOUS | 0 refills | Status: AC

## 2019-04-18 MED ORDER — ADULT MULTIVITAMIN W/MINERALS CH
1.0000 | ORAL_TABLET | Freq: Every day | ORAL | Status: DC
  Administered 2019-04-19 – 2019-04-26 (×8): 1 via ORAL
  Filled 2019-04-18 (×11): qty 1

## 2019-04-18 MED ORDER — LORAZEPAM 1 MG PO TABS
1.0000 mg | ORAL_TABLET | Freq: Four times a day (QID) | ORAL | Status: AC | PRN
  Administered 2019-04-19 – 2019-04-20 (×2): 1 mg via ORAL
  Filled 2019-04-18 (×2): qty 1

## 2019-04-18 MED ORDER — FAMOTIDINE 40 MG/5ML PO SUSR
20.0000 mg | Freq: Two times a day (BID) | ORAL | Status: DC
  Filled 2019-04-18 (×4): qty 2.5

## 2019-04-18 MED ORDER — VITAMIN B-1 100 MG PO TABS
100.0000 mg | ORAL_TABLET | Freq: Every day | ORAL | Status: DC
  Administered 2019-04-19 – 2019-04-26 (×8): 100 mg via ORAL
  Filled 2019-04-18 (×11): qty 1

## 2019-04-18 MED ORDER — MUPIROCIN 2 % EX OINT
1.0000 "application " | TOPICAL_OINTMENT | Freq: Two times a day (BID) | CUTANEOUS | Status: DC
  Administered 2019-04-19: 1 via NASAL
  Filled 2019-04-18: qty 22

## 2019-04-18 MED ORDER — CLONIDINE HCL 0.2 MG PO TABS
0.2000 mg | ORAL_TABLET | Freq: Four times a day (QID) | ORAL | Status: DC
  Filled 2019-04-18 (×2): qty 1

## 2019-04-18 MED ORDER — LORAZEPAM 1 MG PO TABS
1.0000 mg | ORAL_TABLET | Freq: Four times a day (QID) | ORAL | Status: DC | PRN
  Administered 2019-04-18: 22:00:00 1 mg via ORAL
  Filled 2019-04-18: qty 1

## 2019-04-18 MED ORDER — LACTULOSE 10 GM/15ML PO SOLN
20.0000 g | Freq: Three times a day (TID) | ORAL | Status: DC
  Administered 2019-04-19 – 2019-04-24 (×10): 20 g via ORAL
  Filled 2019-04-18 (×32): qty 30

## 2019-04-18 MED ORDER — LORAZEPAM 1 MG PO TABS: 1.0000 mg | ORAL_TABLET | Freq: Four times a day (QID) | ORAL | 0 refills | Status: DC | PRN

## 2019-04-18 MED ORDER — MAGNESIUM HYDROXIDE 400 MG/5ML PO SUSP: 30.0000 mL | Freq: Every day | ORAL | Status: DC | PRN

## 2019-04-18 MED ORDER — LOPERAMIDE HCL 2 MG PO CAPS: 2.0000 mg | ORAL_CAPSULE | ORAL | Status: AC | PRN

## 2019-04-18 MED ORDER — LACTULOSE 10 GM/15ML PO SOLN: 20.0000 g | Freq: Three times a day (TID) | ORAL | Status: DC

## 2019-04-18 MED ORDER — MENTHOL 3 MG MT LOZG: 1.0000 | LOZENGE | OROMUCOSAL | 12 refills | Status: AC | PRN

## 2019-04-18 MED ORDER — CLONIDINE HCL 0.1 MG PO TABS
0.1000 mg | ORAL_TABLET | Freq: Four times a day (QID) | ORAL | Status: DC
  Filled 2019-04-18 (×2): qty 1

## 2019-04-18 MED ORDER — DICYCLOMINE HCL 20 MG PO TABS
20.0000 mg | ORAL_TABLET | Freq: Four times a day (QID) | ORAL | Status: AC | PRN
  Administered 2019-04-19 – 2019-04-21 (×2): 20 mg via ORAL
  Filled 2019-04-18 (×3): qty 1

## 2019-04-18 MED ORDER — MENTHOL 3 MG MT LOZG: 1.0000 | LOZENGE | OROMUCOSAL | Status: DC | PRN

## 2019-04-18 MED ORDER — CLONIDINE HCL 0.1 MG PO TABS: 0.1000 mg | ORAL_TABLET | ORAL | Status: DC

## 2019-04-18 MED ORDER — NAPROXEN 500 MG PO TABS
500.0000 mg | ORAL_TABLET | Freq: Two times a day (BID) | ORAL | Status: AC | PRN
  Administered 2019-04-19 – 2019-04-21 (×2): 500 mg via ORAL
  Filled 2019-04-18 (×2): qty 1

## 2019-04-18 MED ORDER — CLONIDINE HCL 0.2 MG PO TABS: 0.2000 mg | ORAL_TABLET | Freq: Four times a day (QID) | ORAL | 0 refills | Status: DC

## 2019-04-18 MED ORDER — ONDANSETRON HCL 4 MG/2ML IJ SOLN
4.0000 mg | Freq: Once | INTRAMUSCULAR | Status: AC
  Administered 2019-04-18: 20:00:00 4 mg via INTRAVENOUS
  Filled 2019-04-18: qty 2

## 2019-04-18 MED ORDER — HYDROXYZINE HCL 25 MG PO TABS
25.0000 mg | ORAL_TABLET | Freq: Three times a day (TID) | ORAL | Status: DC | PRN
  Administered 2019-04-20 – 2019-04-26 (×6): 25 mg via ORAL
  Filled 2019-04-18 (×4): qty 1
  Filled 2019-04-18: qty 20
  Filled 2019-04-18 (×2): qty 1

## 2019-04-18 MED ORDER — ONDANSETRON 4 MG PO TBDP
4.0000 mg | ORAL_TABLET | Freq: Four times a day (QID) | ORAL | Status: AC | PRN
  Filled 2019-04-18: qty 1

## 2019-04-18 MED ORDER — PHENOL 1.4 % MT LIQD
1.0000 | OROMUCOSAL | Status: DC | PRN
  Administered 2019-04-19 – 2019-04-22 (×3): 1 via OROMUCOSAL
  Filled 2019-04-18: qty 177

## 2019-04-18 MED ORDER — ALUM & MAG HYDROXIDE-SIMETH 200-200-20 MG/5ML PO SUSP: 30.0000 mL | ORAL | Status: DC | PRN

## 2019-04-18 MED ORDER — METHOCARBAMOL 500 MG PO TABS
500.0000 mg | ORAL_TABLET | Freq: Three times a day (TID) | ORAL | Status: AC | PRN
  Administered 2019-04-19 – 2019-04-20 (×2): 500 mg via ORAL
  Filled 2019-04-18 (×2): qty 1

## 2019-04-18 MED ORDER — PHENOL 1.4 % MT LIQD
1.0000 | OROMUCOSAL | Status: DC | PRN
  Filled 2019-04-18: qty 177

## 2019-04-18 MED ORDER — FAMOTIDINE 40 MG/5ML PO SUSR: 20.0000 mg | Freq: Two times a day (BID) | ORAL | 0 refills | Status: DC

## 2019-04-18 MED ORDER — ACETAMINOPHEN 325 MG PO TABS
650.0000 mg | ORAL_TABLET | Freq: Four times a day (QID) | ORAL | Status: DC | PRN
  Administered 2019-04-19 (×2): 650 mg via ORAL
  Filled 2019-04-18 (×2): qty 2

## 2019-04-18 MED ORDER — ALBUTEROL SULFATE HFA 108 (90 BASE) MCG/ACT IN AERS
2.0000 | INHALATION_SPRAY | RESPIRATORY_TRACT | Status: DC | PRN
  Administered 2019-04-19 – 2019-04-26 (×3): 2 via RESPIRATORY_TRACT
  Filled 2019-04-18 (×2): qty 6.7

## 2019-04-18 MED ORDER — THIAMINE HCL 100 MG/ML IJ SOLN: 100.0000 mg | Freq: Once | INTRAMUSCULAR | Status: DC

## 2019-04-18 NOTE — Progress Notes (Signed)
Patient ready to be discharge to [facility]. Writer tried to call report, but staff told me that the bed will be available after 20:00 o'clock and they will take report after 19:30 o'clock. RN will report off to oncoming nurse and will continue to monitor.

## 2019-04-18 NOTE — Progress Notes (Addendum)
Notified MD that pt requesting albuterol nebulizer breathing treatment. MD returned call and stated his VS looked good. Told MD that CN stated pt was course but may be due to where pt was intubated earlier. MD stated he wants respiratory therapist to assess pt before he orders breathing treatment. Respiratory therapist came to bedside, states pt is not in any distress and doesn't need breathing treatment.  Will continue to monitor pt. Nelda Marseille, RN

## 2019-04-18 NOTE — Progress Notes (Signed)
Placed pt back on telemetry.

## 2019-04-18 NOTE — Progress Notes (Signed)
Physical Therapy Treatment Patient Details Name: Taylor Olson MRN: [id number] DOB: 11/25/1961 Today's Date: 04/18/2019    History of Present Illness Pt is a 58 y/o male transferred from [facility] secondary to acute toxic/metabolic encephalopathy.  Presumed secondary to drug overdose intentional versus unintentional. Pt intubated upon arrival and was extubated 4/20. PMH includes alcoholic cirrhosis, PE, COPD, portal HTN, and Hepatitis A.     PT Comments    Pt with much improved tolerance for mobility this session. Also with improved balance as well. Increased ambulation distance and required min guard to supervision for safety. Educated/performed seated and standing LE HEP. Current recommendations appropriate. Will continue to follow acutely to maximize functional mobility independence and safety.    Follow Up Recommendations  Other (comment)([facility])     Equipment Recommendations  Rolling walker with 5" wheels    Recommendations for Other Services       Precautions / Restrictions Precautions Precautions: Fall;Other (comment) Precaution Comments: suicide sitter Restrictions Weight Bearing Restrictions: No    Mobility  Bed Mobility Overal bed mobility: Modified Independent                Transfers Overall transfer level: Needs assistance Equipment used: None Transfers: Sit to/from Stand Sit to Stand: Supervision         General transfer comment: Supervision for safety.   Ambulation/Gait Ambulation/Gait assistance: Supervision;Min guard Gait Distance (Feet): 450 Feet Assistive device: Rolling walker (2 wheeled);None Gait Pattern/deviations: Step-through pattern;Decreased stride length Gait velocity: Decreased    General Gait Details: Pt with much improved steadiness this session. Used RW for longer distances. Pt with mild SOB, however, oxygen sats >95% on RA. Educated about pursed lip breathing technique to use when ambulating. Pt ambulating short distance  within the room without AD. No LOB noted.    Stairs             Wheelchair Mobility    Modified Rankin (Stroke Patients Only)       Balance Overall balance assessment: Needs assistance Sitting-balance support: No upper extremity supported;Feet supported Sitting balance-Leahy Scale: Good     Standing balance support: No upper extremity supported;During functional activity;Bilateral upper extremity supported Standing balance-Leahy Scale: Fair                              Cognition Arousal/Alertness: Awake/alert Behavior During Therapy: WFL for tasks assessed/performed Overall Cognitive Status: Within Functional Limits for tasks assessed                                 General Comments: Pt with much improved affect and was not groggy.       Exercises General Exercises - Lower Extremity Long Arc Quad: AROM;Both;10 reps;Seated Hip Flexion/Marching: AROM;Both;10 reps;Standing Toe Raises: AROM;Both;10 reps;Seated;15 reps;Standing(10 reps seated; 15 reps standing to work on balance) Heel Raises: AROM;Both;10 reps;Seated Mini-Sqauts: AROM;Both;10 reps;Standing    General Comments        Pertinent Vitals/Pain Pain Assessment: Faces Faces Pain Scale: Hurts little more Pain Location: R thigh  Pain Descriptors / Indicators: Burning Pain Intervention(s): Limited activity within patient's tolerance;Monitored during session;Repositioned    Home Living                      Prior Function            PT Goals (current goals can now be found in the  care plan section) Acute Rehab PT Goals Patient Stated Goal: "to work with PT"  PT Goal Formulation: With patient Time For Goal Achievement: 05/01/19 Potential to Achieve Goals: Good Progress towards PT goals: Progressing toward goals    Frequency    Min 3X/week      PT Plan Current plan remains appropriate    Co-evaluation              AM-PAC PT "6 Clicks" Mobility    Outcome Measure  Help needed turning from your back to your side while in a flat bed without using bedrails?: None Help needed moving from lying on your back to sitting on the side of a flat bed without using bedrails?: None Help needed moving to and from a bed to a chair (including a wheelchair)?: A Little Help needed standing up from a chair using your arms (e.g., wheelchair or bedside chair)?: None Help needed to walk in hospital room?: A Little Help needed climbing 3-5 steps with a railing? : A Little 6 Click Score: 21    End of Session Equipment Utilized During Treatment: Gait belt Activity Tolerance: Patient tolerated treatment well Patient left: in bed;with call bell/phone within reach;with nursing/sitter in room Nurse Communication: Mobility status PT Visit Diagnosis: Unsteadiness on feet (R26.81);Muscle weakness (generalized) (M62.81);Other abnormalities of gait and mobility (R26.89)     Time: [id number] PT Time Calculation (min) (ACUTE ONLY): 26 min  Charges:  $Gait Training: 8-22 mins $Therapeutic Exercise: 8-22 mins                     Gladys DammeBrittany Michaela Shankel, PT, DPT  Acute Rehabilitation Services  Pager: [phone] Office: [phone]    Lehman PromBrittany S Owais Pruett 04/18/2019, 3:43 PM

## 2019-04-18 NOTE — Progress Notes (Addendum)
Writer went back in the patient's room and explained to him that it is time for medicine and he agree to take his pills. Went Clinical research associate back in the patient's room he verbalized that he will not take any medicine and he asked to talk with the Child psychotherapist. Social worker - unavailable at this time. Writer asked patient if staff can help him with something and patient answered "it is not your job what I want and you can get out of my room". Charge nurse was informed.Will continue to monitor.

## 2019-04-18 NOTE — TOC Progression Note (Signed)
Transition of Care [facility]) - Progression Note    Patient Details  Name: Taylor Olson MRN: [id number] Date of Birth: 01/23/1961  [facility] CM/SW Contact  Mearl Latin, LCSW Phone Number: 04/18/2019, 2:18 PM  Clinical Narrative:    [facility] requesting patient ambulation to check sats. PT on the schedule to see patient. Patient signed voluntary form and CSW faxed it to [facility] and placed on shadow chart.      Barriers to Discharge: Continued Medical Work up  Expected Discharge Plan and Services     Discharge Planning Services: CM Consult                                           Social Determinants of Health (SDOH) Interventions    Readmission Risk Interventions Readmission Risk Prevention Plan 04/18/2019 04/17/2019  Post Dischage Appt Complete Complete  Medication Screening Complete -  Transportation Screening Complete -

## 2019-04-18 NOTE — TOC Transition Note (Addendum)
Transition of Care [facility]) - CM/SW Discharge Note   Patient Details  Name: Taylor Olson MRN: [id number] Date of Birth: November 02, 1961  Transition of Care [facility]) CM/SW Contact:  Mearl Latin, LCSW Phone Number: 04/18/2019, 3:39 PM   Clinical Narrative:    Patient will DC to: [facility] Anticipated DC date: 04/18/19 Transport by: Juel Burrow scheduled for 8pm, will call when they arrive [phone])   302 Bed 1  Per MD patient ready for DC to [facility]. RN, patient, patient's family, and facility notified of DC. Discharge Summary sent to facility. RN to call report PRIOR to discharge [phone]). DC packet on chart. Ambulance transport requested for patient.   CSW will sign off for now as social work intervention is no longer needed. Please consult [place] again if new needs arise.  Cristobal Goldmann, LCSW Clinical Social Worker [phone]      Barriers to Discharge: Continued Medical Work up   Patient Goals and CMS Choice        Discharge Placement                       Discharge Plan and Services   Discharge Planning Services: CM Consult                                 Social Determinants of Health (SDOH) Interventions     Readmission Risk Interventions Readmission Risk Prevention Plan 04/18/2019 04/17/2019  Post Dischage Appt Complete Complete  Medication Screening Complete -  Transportation Screening Complete -

## 2019-04-18 NOTE — Progress Notes (Signed)
RT assessed pt per RN request.  Pt is on room air with and SpO2 of 99. Pt's breathing is unlabored, regular and symmetrical. Pt is also noted to have clear breath sounds. RN notified.

## 2019-04-18 NOTE — TOC Progression Note (Signed)
Transition of Care [facility]) - Progression Note    Patient Details  Name: Taylor Olson MRN: [id number] Date of Birth: [birth year]  Transition [facility] Care [facility]) CM/SW Contact  Mearl Latin, LCSW Phone Number: 04/18/2019, 11:19 AM  Clinical Narrative:    CSW received consult that patient is medically stable for inpatient psych placement. CSW sent referral to [facility] for review.      Barriers to Discharge: Continued Medical Work up  Expected Discharge Plan and Services     Discharge Planning Services: CM Consult                                           Social Determinants of Health (SDOH) Interventions    Readmission Risk Interventions Readmission Risk Prevention Plan 04/18/2019 04/17/2019  Post Dischage Appt Complete Complete  Medication Screening Complete -  Transportation Screening Complete -

## 2019-04-18 NOTE — Progress Notes (Signed)
Sitter at bedside states pt is refusing to wear telemetry. Pt is asleep. Notified central monitoring telemetry that pt refusing. Notified Kathlene November, CN also. Will continue to monitor pt. Nelda Marseille, RN

## 2019-04-18 NOTE — Progress Notes (Signed)
Rapid Response Transfer Rounds  1445: Patient up from bed to chair, doing well, only complaint is sore throat.  Has tried the lozenges and would like spray instead.  MD paged [phone], returned call [phone] - will put in an order for spray.   Cato Mulligan, RN

## 2019-04-18 NOTE — Progress Notes (Signed)
SATURATION QUALIFICATIONS: (This note is used to comply with regulatory documentation for home oxygen)  Patient Saturations on Room Air at Rest = 97%  Patient Saturations on Room Air while Ambulating = 95%    Please briefly explain why patient needs home oxygen: Pt's oxygen sats >95% on RA during ambulation, therefore does not need supplemental oxygen.   Gladys Damme, PT, DPT  Acute Rehabilitation Services  Pager: [phone] Office: [phone]

## 2019-04-18 NOTE — Progress Notes (Signed)
Admission note:  Pt is a 58 year old Caucasian male admitted to the services of Dr. Jola Babinski for depression and suicidal ideation.  Pt had apparently overdosed on unknown number of Flexeril and possibly neurontin.  Pt was intubated and sent to ICU from [facility].  Pt recently moved here from [facility] to live with sister and brother in law.  Pt has had numerous health issues recently and has spent quite some time in the hospital.  Pt states he has had blood clots, cyst on lung, legs swelling, fluid drained off.  Pt states he is cirrhosis of the liver.  Pt denies current drug or alcohol abuse and did not wish to go into detail regarding past.  Pt states wife lives in [place] and that is where he lived until they were separated due to his health.  Pt is cooperative with admission process.

## 2019-04-18 NOTE — Progress Notes (Signed)
Writer tried few times this AM to administer his medication but he refused. He accepted only Lovenox. When I asked about Ativan, he said he doesn't want right now because he doesn't want to fall a sleep again. Will continue to monitor.

## 2019-04-18 NOTE — Progress Notes (Signed)
eLink Physician-Brief Progress Note Patient Name: Taylor Olson DOB: 1961-10-10 MRN: [id number]   Date of Service  04/18/2019  HPI/Events of Note  Patient requesting a breathing treatment, indication is not clear-he has no history of COPD or other lung problem and his respiratory rate is 16, with a room air saturation of 100 %.  eICU Interventions  I've asked respiratory therapy to assess him and report back to me. Will withhold breathing treatment pending that assessment.        Thomasene Lot Tabria Steines 04/18/2019, 5:29 AM

## 2019-04-18 NOTE — Discharge Summary (Signed)
Physician Discharge Summary  Patient ID: Taylor PorterRobert Dina MRN: [id number] DOB/AGE: 1961/03/02 58 y.o.  Admit date: 04/13/2019 Discharge date: 04/18/2019  Admission Diagnoses:  Discharge Diagnoses:  Principal Problem:   MDD (major depressive disorder), single episode, severe , no psychosis (HCC) Active Problems:   Acute metabolic encephalopathy   Discharged Condition: stable  Hospital Course:  This is a 58 year old male who presented to [place] ER after being found with altered mental status. On ER eval had rapid pupillary changes (dilating and constricting), fluctuant mental status between awake and unresponsive,apparently takes baclofen, and gabapentinand was found w/ empty bottle of flexeril near him on EMS arrival In ER he was intubated. Per emergency room report was found in vomit. Dx eval in ER  CT brain neg + metabolic acidosis,CBC was unremarkable mild metabolic acidosis with anion gap of 13, lactic acid of 1.8 mildly elevated chloride otherwise normal blood chemistry. Ammonia level 35 Patient was admitted with acute toxic and metabolic encephalopathy along with acute respiratory failure secondary to ineffective airway protection. He was extubated on 4/20, but had to be placed back on Precedex that afternoon due to agitation/behavioral disturbances. He appears to be depressed and claims his wife left him due to his illness. Patient has been expressing suicidal ideation admitted taking overdose of baclofen in an attempt to kill himself due to worsening depression. Patient was evaluated by psychiatry and recommended inpatient psychiatric confinement as patient is a harm to himself. Psych recommended pursuing involuntary commitment if patient refuses psychiatric hospitalization. He is currently having a one-on-one sitter due to suicidal ideation. Patient is being discharged to psychiatric facility for continuation of management.  Consults: psychiatry  Significant Diagnostic  Studies: labs: cbc, cmp and radiology: X-Ray: Negative and CT scan: negative  Treatments: IV hydration and Intubation and extubation in ICU  Discharge Exam: Blood pressure 122/76, pulse 83, temperature 98.6 F (37 C), resp. rate 16, height 5\' 9"  (1.753 m), weight 87.3 kg, SpO2 100 %.   General appearance: alert, cooperative and appears stated age Head: Normocephalic, without obvious abnormality, atraumatic Eyes: conjunctivae/corneas clear. PERRL, EOM's intact. Fundi benign. Nose: Nares normal. Septum midline. Mucosa normal. No drainage or sinus tenderness. Throat: lips, mucosa, and tongue normal; teeth and gums normal Neck: no adenopathy, no carotid bruit, no JVD, supple, symmetrical, trachea midline and thyroid not enlarged, symmetric, no tenderness/mass/nodules Resp: clear to auscultation bilaterally Chest wall: no tenderness Cardio: regular rate and rhythm, S1, S2 normal, no murmur, click, rub or gallop GI: soft, non-tender; bowel sounds normal; no masses,  no organomegaly Extremities: extremities normal, atraumatic, no cyanosis or edema Skin: Skin color, texture, turgor normal. No rashes or lesions Neurologic: Alert and oriented X 3, normal strength and tone. Normal symmetric reflexes. Normal coordination and gait Mental status: Alert, oriented, thought content appropriate, affect: blunted and flat  Disposition:    Allergies as of 04/18/2019      Reactions   Tramadol    [facility]      Medication List    STOP taking these medications   ibuprofen 400 MG tablet Commonly known as:  ADVIL     TAKE these medications   atorvastatin 40 MG tablet Commonly known as:  LIPITOR Take 40 mg by mouth daily.   cloNIDine 0.2 MG tablet Commonly known as:  CATAPRES Take 1 tablet (0.2 mg total) by mouth every 6 (six) hours.   famotidine 40 MG/5ML suspension Commonly known as:  PEPCID Take 2.5 mLs (20 mg total) by mouth  2 (two) times daily.   furosemide 40  MG tablet Commonly known as:  LASIX Take 40 mg by mouth daily.   gabapentin 300 MG capsule Commonly known as:  NEURONTIN Take 300 mg by mouth 3 (three) times daily.   lactulose 10 GM/15ML solution Commonly known as:  CHRONULAC Take 20 g by mouth 3 (three) times daily.   LORazepam 1 MG tablet Commonly known as:  ATIVAN Take 1 tablet (1 mg total) by mouth every 6 (six) hours as needed for up to 5 days for anxiety.   menthol-cetylpyridinium 3 MG lozenge Commonly known as:  CEPACOL Take 1 lozenge (3 mg total) by mouth as needed for sore throat.   mupirocin ointment 2 % Commonly known as:  BACTROBAN Place 1 application into the nose 2 (two) times daily.   phenol 1.4 % Liqd Commonly known as:  CHLORASEPTIC Use as directed 1 spray in the mouth or throat as needed for throat irritation / pain.      Follow-up Information    [facility] [phone]) Follow up.   Why:  Call number above to register with [facility] services.        [facility]. Go on 04/30/2019.   Why:  appt is scheduled for 9:30am, please arrive 15 minutes early for your appt Contact information: [address] [place] [address] [phone]          Signed: Aquilla Hacker 04/18/2019, 4:02 PM

## 2019-04-18 NOTE — Progress Notes (Signed)
Per sitter, pt experienced 2 episodes of emesis. Pt asked sitter not to tell RN so that he could be discharged. Assessed pt. Pt described emesis of being "a good enough amount". Pt stated that he has been feeling nauseous all day, and that this is not his first time vomiting today.  continues to feel nauseous. Night coverage NP, Blount, paged and made aware that pt was due to be transferred to [facility] shortly.   Pt given 1x dose of IV zofran and PRN ativan. Pt denies any other complaints. IVs removed. Pt escorted to transportation at [employer] with DC packet by NT. Report given to Marisue Ivan at [facility].

## 2019-04-18 NOTE — Tx Team (Signed)
Initial Treatment Plan 04/18/2019 11:27 PM [name] MHW:[id number]    PATIENT STRESSORS: Financial difficulties Health problems Marital or family conflict   PATIENT STRENGTHS: Average or above average intelligence Communication skills   PATIENT IDENTIFIED PROBLEMS: Depression  Suicidal Ideation    "I want to feel better"  "I want to be discharged"             DISCHARGE CRITERIA:  Improved stabilization in mood, thinking, and/or behavior Motivation to continue treatment in a less acute level of care Need for constant or close observation no longer present Verbal commitment to aftercare and medication compliance  PRELIMINARY DISCHARGE PLAN: Participate in family therapy Return to previous living arrangement  PATIENT/FAMILY INVOLVEMENT: This treatment plan has been presented to and reviewed with the patient, Taylor Olson.  The patient and family have been given the opportunity to ask questions and make suggestions.  Juliann Pares, RN 04/18/2019, 11:27 PM

## 2019-04-19 LAB — GLUCOSE, CAPILLARY: Glucose-Capillary: 79 mg/dL (ref 70–99)

## 2019-04-19 LAB — RETICULOCYTES
Immature Retic Fract: 12.7 % (ref 2.3–15.9)
RBC.: 2.97 MIL/uL — ABNORMAL LOW (ref 4.22–5.81)
Retic Count, Absolute: 84.9 10*3/uL (ref 19.0–186.0)
Retic Ct Pct: 2.9 % (ref 0.4–3.1)

## 2019-04-19 LAB — IRON AND TIBC
Iron: 47 ug/dL (ref 45–182)
Saturation Ratios: 15 % — ABNORMAL LOW (ref 17.9–39.5)
TIBC: 307 ug/dL (ref 250–450)
UIBC: 260 ug/dL

## 2019-04-19 LAB — VITAMIN B12: Vitamin B-12: 105 pg/mL — ABNORMAL LOW (ref 180–914)

## 2019-04-19 LAB — AMMONIA: Ammonia: 38 umol/L — ABNORMAL HIGH (ref 9–35)

## 2019-04-19 LAB — FOLATE: Folate: 13.7 ng/mL (ref >5.9)

## 2019-04-19 LAB — FERRITIN: Ferritin: 23 ng/mL — ABNORMAL LOW (ref 24–336)

## 2019-04-19 MED ORDER — SERTRALINE HCL 25 MG PO TABS
25.0000 mg | ORAL_TABLET | Freq: Every day | ORAL | Status: DC
  Administered 2019-04-19 – 2019-04-20 (×2): 25 mg via ORAL
  Filled 2019-04-19 (×3): qty 1

## 2019-04-19 MED ORDER — FAMOTIDINE 20 MG PO TABS
20.0000 mg | ORAL_TABLET | Freq: Two times a day (BID) | ORAL | Status: DC
  Administered 2019-04-19 – 2019-04-24 (×4): 20 mg via ORAL
  Filled 2019-04-19 (×4): qty 1
  Filled 2019-04-19 (×2): qty 28
  Filled 2019-04-19 (×3): qty 1
  Filled 2019-04-19: qty 28
  Filled 2019-04-19 (×6): qty 1
  Filled 2019-04-19: qty 28
  Filled 2019-04-19 (×3): qty 1
  Filled 2019-04-19 (×2): qty 28
  Filled 2019-04-19: qty 1

## 2019-04-19 NOTE — Progress Notes (Signed)
   04/19/19 0500  What Happened  Was fall witnessed? No  Was patient injured? Unsure  Patient found on floor  Found by Staff-comment (MHT)  Stated prior activity bathroom-unassisted  Follow Up  MD notified Nira Conn, NP  Time MD notified [phone]  Family notified No- patient refusal  Additional tests No  Progress note created (see row info) Yes  Adult Fall Risk Assessment  Risk Factor Category (scoring not indicated) High fall risk per protocol (document High fall risk)  Age 58  Fall History: Fall within 6 months prior to admission 0  Elimination; Bowel and/or Urine Incontinence 0  Elimination; Bowel and/or Urine Urgency/Frequency 0  Medications: includes PCA/Opiates, Anti-convulsants, Anti-hypertensives, Diuretics, Hypnotics, Laxatives, Sedatives, and Psychotropics 3  Patient Care Equipment 1  Mobility-Assistance 0  Mobility-Gait 2 (utilizes walker, Pt did not take with him)  Mobility-Sensory Deficit 0  Altered awareness of immediate physical environment 0  Impulsiveness 0  Lack of understanding of one's physical/cognitive limitations 0  Total Score 6  Patient Fall Risk Level High fall risk  Adult Fall Risk Interventions  Required Bundle Interventions *See Row Information* High fall risk - low, moderate, and high requirements implemented  Additional Interventions Assess orthostatic BP;Room near nurses station

## 2019-04-19 NOTE — BHH Suicide Risk Assessment (Signed)
[facility]   Nursing information obtained from:  Patient Demographic factors:  Male, Caucasian Current Mental Status:  Suicidal ideation indicated by others, Self-harm behaviors Loss Factors:  Decline in physical health, Financial problems / change in socioeconomic status Historical Factors:  Impulsivity Risk Reduction Factors:  Responsible for children under 59 years of age, Sense of responsibility to family, Living with another person, especially a relative  Total Time spent with patient: 30 minutes Principal Problem: <principal problem not specified> Diagnosis:  Active Problems:   MDD (major depressive disorder), recurrent severe, without psychosis (HCC)  Subjective Data: Patient is seen and examined.  Patient is a 58 year old male with a past psychiatric history significant for history of alcohol dependence, hepatitis, portal hypertension, previous pulmonary emboli, and previous episodes of syncope who [facility] department on 04/13/2019 after an intentional overdose of baclofen.  [facility] and was placed in the ICU.  He had episodes of delirium and received propofol as well as Precedex.  He was eventually weaned off the ventilator and assessed by psychiatric consultation.  He was transferred to our facility for evaluation and stabilization.  In my interview today the patient stated that he was unaware that he had done the overdose, he said it was accidental.  There are notes in the chart that state that this was an intentional overdose to kill himself.  There is also a note in the chart from nursing who spoke to his brother-in-law that the patient did live in [place] in a camper.  He had been recently admitted to [facility] secondary to syncope.  He had been discharged on good Friday.  He drove the patient to his home in [place], and the patient is essentially  homeless.  He is apparently a veteran and does have veterans benefits.  The patient stated that he had been sad after his wife and lifting.  He stated that his wife was unable to tolerate his chronic medical illnesses and his inability to be able to work.  Despite the history in the chart the patient denied any substance abuse, and when his cirrhosis diagnosis was discussed he stated he was unaware of why he had cirrhosis.  On admission today he is significantly fatigued, he is anemic, and leukopenic.  He had a fall earlier today on the unit.  He is now on one-to-one, and also he tested positive for MRSA screen.  He is on contact precautions.  His most recent laboratories revealed a normal ALT and AST, his ammonia was 78 on 4/24.  His white blood cell count is 2.9, hemoglobin is 8.2, hematocrit is 24.6.  His platelets are 86,000.  PT was 14.4, INR was 1.1.  Chest x-ray from 4/20 showed no acute abnormality.  His EKG had a poor baseline, but was sinus rhythm.  Because of his intentional overdose he was admitted to the psychiatric hospital for evaluation and stabilization.  Continued Clinical Symptoms:  Alcohol Use Disorder Identification Test Final Score (AUDIT): 0 The "Alcohol Use Disorders Identification Test", Guidelines for Use in Primary Care, Second Edition.  World Science writerHealth Organization [facility](WHO). Score between 0-7:  no or low risk or alcohol related problems. Score between 8-15:  moderate risk of alcohol related problems. Score between 16-19:  high risk of alcohol related problems. Score 20 or above:  warrants further diagnostic evaluation for alcohol dependence and treatment.   CLINICAL FACTORS:   Depression:   Anhedonia Hopelessness  Impulsivity Insomnia Alcohol/Substance Abuse/Dependencies More than one psychiatric diagnosis Previous Psychiatric Diagnoses and Treatments Medical Diagnoses and Treatments/Surgeries   Musculoskeletal: Strength & Muscle Tone: decreased Gait & Station:  unsteady Patient leans: N/A  Psychiatric Specialty Exam: Physical Exam  Nursing note and vitals reviewed. Constitutional: He is oriented to person, place, and time. He appears well-developed and well-nourished.  HENT:  Head: Atraumatic.  Respiratory: Effort normal.  Neurological: He is alert and oriented to person, place, and time.    ROS  Blood pressure (!) 119/101, pulse 83, temperature 98 F (36.7 C), temperature source Oral, resp. rate 20, height  (1.753 m), weight 87.3 kg, SpO2 100 %.Body mass index is 28.41 kg/m.  General Appearance: Disheveled  Eye Contact:  Fair  Speech:  Normal Rate  Volume:  Decreased  Mood:  Depressed  Affect:  Congruent  Thought Process:  Coherent and Descriptions of Associations: Circumstantial  Orientation:  Full (Time, Place, and Person)  Thought Content:  Logical  Suicidal Thoughts:  No  Homicidal Thoughts:  No  Memory:  Immediate;   Fair Recent;   Fair Remote;   Fair  Judgement:  Impaired  Insight:  Lacking  Psychomotor Activity:  Decreased  Concentration:  Concentration: Fair and Attention Span: Fair  Recall:  [employer] of Knowledge:  Fair  Language:  Fair  Akathisia:  Negative  Handed:  Right  AIMS (if indicated):     Assets:  Desire for Improvement Resilience  ADL's:  Impaired  Cognition:  Impaired,  Mild  Sleep:  Number of Hours: 6      COGNITIVE FEATURES THAT CONTRIBUTE TO RISK:  None    SUICIDE RISK:   Mild:  Suicidal ideation of limited frequency, intensity, duration, and specificity.  There are no identifiable plans, no associated intent, mild dysphoria and related symptoms, good self-control (both objective and subjective assessment), few other risk factors, and identifiable protective factors, including available and accessible social support.  PLAN OF CARE: Patient is seen and examined.  Patient is a 58 year old male with the above-stated past psychiatric history who is transferred to our facility for evaluation  and stabilization after an intentional overdose.  Given his multiple medical illnesses I suspect the better part of valor is to start him on Zoloft.  We will start at 25 mg p.o. daily.  We will titrate that during the course the hospitalization.  He has several issues that are ongoing that we need further information on.  He is significantly anemic, and we will repeat his CBC with differential as well as iron studies.  His ammonia is elevated, and he continues on lactulose 20 g p.o. 3 times daily.  We will follow his ammonia basically on a daily basis.  He denied any recent alcohol abuse, but we will continue the lorazepam 1 mg p.o. every 6 hours as needed and a CIWA greater than 10.  His blood pressure has been low, and it sounds like he has had a syncopal work-up in the past.  We will attempt to get those records from [facility].  He will also be monitored for opiate withdrawal, and we will attempt to treat that symptomatically but without clonidine given his blood pressure and syncopal issues in the past.  We will also obtain a physical therapy or occupational therapy consult.  This will be taken care of on Monday.  We will give him a wheelchair or walker currently.  From the look of the old medical record it does appear that  housing will be a problem, and social work will become involved in his discharge planning.  He is also leukopenic but his HIV was negative on the medical side of the hospital.  Hopefully we can improve his current situation.  I certify that inpatient services furnished can reasonably be expected to improve the patient's condition.   Antonieta Pert, MD 04/19/2019, 12:01 PM

## 2019-04-19 NOTE — Progress Notes (Signed)
1:1 Nursing Note  D.  Pt has been observed up and active in milieu.  Pt was positive for evening wrap up group with appropriate interaction.  Pt states he has felt much better today but is still experiencing some lower back pain.  Pt states it started this afternoon.  Pt also states that he has had some sharp pains in his right upper leg that feel like he is being stabbed in the leg.  Pt thinks this could be a pinched nerve.  Pt denies SI/HI/AVH at this time.  A.  Support and encouragement offered, medication given as ordered  R.  Pt remains safe on the unit, remains a 1:1 for high fall risk.
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
## 2019-04-19 NOTE — Progress Notes (Signed)
1:1 note  Pt has been viewed in the dayroom interacting with peers. Pt participated in groups. Pt animated in affect. Pt still complaining of lower back pain. Pt denies si/hi/ah/vh and verbally agrees to approach staff if these become apparent or before harming himself/others while at [facility]. Pt denies any other symptoms. Pt is still unsteady with his gait. Pt complaint with his walker. Pt safe on the unit. Sitter within arms reach. 1:1 continues for safety .

## 2019-04-19 NOTE — Progress Notes (Signed)
Adult Psychoeducational Group Note  Date:  04/19/2019 Time:  1:45pm-3:00pm  Group Topic/Focus:  Managing Feelings:   The focus of this group is to identify what feelings patients have difficulty handling and develop a plan to handle them in a healthier way upon discharge.  Participation Level:  Active  Participation Quality:  Appropriate and Attentive  Affect:  Appropriate  Cognitive:  Alert, Appropriate and Oriented  Insight: Appropriate  Engagement in Group:  Engaged  Modes of Intervention:  Discussion and Support  Additional Comments:  Patient informed the group that he has been struggling with thinking about himself without considering others. Patient informed the group that he can cope with the impact of this by working to recognize other peoples feelings. Patient informed the group that this is better than other coping skills because listening to other peoples feelings makes them want to stay around them. Patient informed the group that his friends and family can help him to cope with his efforts to cope with the consequences of his thinking about himself by acknowledging that they make mistakes and not putting him down. Patient informed the group that he needs to work towards changing his environment.   Annye Asa 04/19/2019, 6:29 PM

## 2019-04-19 NOTE — Progress Notes (Signed)
Patient examined after fall. Patient states that he was walking back from bathroom, became dizzy, lost his balance, and fell. States that he hit the back of his head. Denies blacking out. Patient is alert and oriented x 4. PERRL. Speech clear and coherent. MS 5/5 in all extremities. No evidence of injury. No tenderness upon palpation of head/scalp. No bruising, abrasions, or bleeding noted. Ambulated with minimal assistance to another room. Placed in room with a hospital bed. Will place on 1:1 due to fall risk.   .. Vitals:   04/18/19 2318 04/19/19 0500  BP: 117/84 (!) 124/91  Pulse: 79 78  Resp:  20  Temp: 98.3 F (36.8 C) 97.8 F (36.6 C)  SpO2: 100% 100%

## 2019-04-19 NOTE — Progress Notes (Signed)
Pt sitting on side bed. Staff remind pt safety please put yellow socks on when sitting or standing due to fall this early morning. Safety first. Pt goes to bathroom without the aide walker. Staff asked pt to please use wheelchair due to fall early this morning. RN notify

## 2019-04-19 NOTE — Progress Notes (Signed)
Physical Therapy Contact Note  Noted new order for PT evaluation. Please see PT Evaluation from 04/17/19, and PT treatment notes from 04/18/19. Noted that pt had a fall this morning. Per phone conversation with pt's RN, Casimiro Needle, pt was non-compliant with using the walker and was not wearing non-skid socks. PT recommending use of rolling walker and supervision for mobility.   Ralene Bathe Kistler PT 04/19/2019  Acute Rehabilitation Services Pager [phone] Office [phone]

## 2019-04-19 NOTE — Progress Notes (Signed)
1:1 note  Pt has been visible on the unit; bright affect. Pt is walking with care. Using his walker for assistance. Pt doesn't seem to be anxious or in pain, but does complain of throat irritation. Pt denies si/hi/ah/vh and verbally agrees to approach staff if these become apparent or before harming himself/others while at [facility]. 1:1 continues for safety. Sitter within reach. Pt safe on the unit. Will continue to monitor.

## 2019-04-19 NOTE — [facility] Group Notes (Signed)
[facility] Group Notes:  (Nursing/MHT/Case Management/Adjunct)  Date:  04/19/2019  Time:  4:00 PM  Type of Therapy:  Nurse Education  Participation Level:  Active  Participation Quality:  Appropriate and Attentive  Affect:  Appropriate  Cognitive:  Alert and Appropriate  Insight:  Appropriate  Engagement in Group:  Engaged  Modes of Intervention:  Discussion and Education  Summary of Progress/Problems: pt's discussed issues with anger and coping skills to utilize once they are discharged.   Suszanne Conners Lani Havlik 04/19/2019, 6:07 PM

## 2019-04-19 NOTE — Progress Notes (Signed)
Adult Psychoeducational Group Note  Date:  04/19/2019 Time:  9:20 PM  Group Topic/Focus:  Wrap-Up Group:   The focus of this group is to help patients review their daily goal of treatment and discuss progress on daily workbooks.  Participation Level:  Active  Participation Quality:  Appropriate  Affect:  Appropriate  Cognitive:  Appropriate  Insight: Appropriate  Engagement in Group:  Engaged  Modes of Intervention:  Discussion  Additional Comments:  Patient attended wrap-up group and said that his day was a 10.  His goal for today is to get his wife back. He intends to achieve this goal by making a plan while he's in the hospital. His coping skills for today were watching tv and socializing.   Taylor Olson Taylor Olson 04/19/2019, 9:20 PM

## 2019-04-19 NOTE — H&P (Signed)
Psychiatric Admission Assessment Adult  Patient Identification: Taylor PorterRobert Olson MRN:  [id number] Date of Evaluation:  04/19/2019 Chief Complaint:  MDD, Single-Episode Principal Diagnosis: <principal problem not specified> Diagnosis:  Active Problems:   MDD (major depressive disorder), recurrent severe, without psychosis (HCC)  History of Present Illness: Patient is seen and examined.  Patient is a 58 year old male with a past psychiatric history significant for history of alcohol dependence, hepatitis, portal hypertension, previous pulmonary emboli, and previous episodes of syncope who originally presented to the [facility] emergency department on 04/13/2019 after an intentional overdose of baclofen.  Patient [facility] and was placed in the ICU.  He had episodes of delirium and received propofol as well as Precedex.  He was eventually weaned off the ventilator and assessed by psychiatric consultation.  He was transferred to our facility for evaluation and stabilization.  In my interview today the patient stated that he was unaware that he had done the overdose, he said it was accidental.  There are notes in the chart that state that this was an intentional overdose to kill himself.  There is also a note in the chart from nursing who spoke to his brother-in-law that the patient did live in [place] in a camper.  He had been recently admitted to [facility] secondary to syncope.  He had been discharged on good Friday.  He drove the patient to his home in [place], and the patient is essentially homeless.  He is apparently a veteran and does have veterans benefits.  The patient stated that he had been sad after his wife and lifting.  He stated that his wife was unable to tolerate his chronic medical illnesses and his inability to be able to work.  Despite the history in the chart the patient denied any substance abuse, and when his cirrhosis diagnosis  was discussed he stated he was unaware of why he had cirrhosis.  On admission today he is significantly fatigued, he is anemic, and leukopenic.  He had a fall earlier today on the unit.  He is now on one-to-one, and also he tested positive for MRSA screen.  He is on contact precautions.  His most recent laboratories revealed a normal ALT and AST, his ammonia was 78 on 4/24.  His white blood cell count is 2.9, hemoglobin is 8.2, hematocrit is 24.6.  His platelets are 86,000.  PT was 14.4, INR was 1.1.  Chest x-ray from 4/20 showed no acute abnormality.  His EKG had a poor baseline, but was sinus rhythm.  Because of his intentional overdose he was admitted to the psychiatric hospital for evaluation and stabilization.  Associated Signs/Symptoms: Depression Symptoms:  depressed mood, anhedonia, hypersomnia, psychomotor retardation, fatigue, feelings of worthlessness/guilt, difficulty concentrating, hopelessness, suicidal thoughts with specific plan, suicidal attempt, loss of energy/fatigue, disturbed sleep, weight loss, (Hypo) Manic Symptoms:  Impulsivity, Anxiety Symptoms:  Excessive Worry, Psychotic Symptoms:  denied PTSD Symptoms: Negative Total Time spent with patient: 30 minutes  Past Psychiatric History: Patient denied any previous admissions for psychiatric reasons or psychiatric medications.  Is the patient at risk to self? Yes.    Has the patient been a risk to self in the past 6 months? Yes.    Has the patient been a risk to self within the distant past? Yes.    Is the patient a risk to others? No.  Has the patient been a risk to others in the past 6 months? No.  Has the patient been  a risk to others within the distant past? No.   Prior Inpatient Therapy:   Prior Outpatient Therapy:    Alcohol Screening: 1. How often do you have a drink containing alcohol?: Never 2. How many drinks containing alcohol do you have on a typical day when you are drinking?: 1 or 2 3. How often  do you have six or more drinks on one occasion?: Never AUDIT-C Score: 0 4. How often during the last year have you found that you were not able to stop drinking once you had started?: Never 5. How often during the last year have you failed to do what was normally expected from you becasue of drinking?: Never 6. How often during the last year have you needed a first drink in the morning to get yourself going after a heavy drinking session?: Never 7. How often during the last year have you had a feeling of guilt of remorse after drinking?: Never 8. How often during the last year have you been unable to remember what happened the night before because you had been drinking?: Never 9. Have you or someone else been injured as a result of your drinking?: No 10. Has a relative or friend or a doctor or another health worker been concerned about your drinking or suggested you cut down?: No Alcohol Use Disorder Identification Test Final Score (AUDIT): 0 Alcohol Brief Interventions/Follow-up: AUDIT Score <7 follow-up not indicated Substance Abuse History in the last 12 months:  Yes.   Consequences of Substance Abuse: Medical Consequences:  : Cirrhosis Family Consequences:  : Wife apparently left him secondary to his medical issues/psychiatric issues. Previous Psychotropic Medications: No  Psychological Evaluations: No  Past Medical History:  Past Medical History:  Diagnosis Date  . COPD (chronic obstructive pulmonary disease) (HCC)   . GERD (gastroesophageal reflux disease)   . History of blood clots     Past Surgical History:  Procedure Laterality Date  . CARDIAC CATHETERIZATION     Family History: History reviewed. No pertinent family history. Family Psychiatric  History: Noncontributory Tobacco Screening: Have you used any form of tobacco in the last 30 days? (Cigarettes, Smokeless Tobacco, Cigars, and/or Pipes): No Social History:  Social History   Substance and Sexual Activity  Alcohol  Use Not Currently  . Frequency: Never     Social History   Substance and Sexual Activity  Drug Use Never    Additional Social History: Marital status: Separated Separated, when?: 3 months ago What types of issues is patient dealing with in the relationship?: "normal stuff" speaking harshly, not appreciating her Are you sexually active?: No What is your sexual orientation?: straight Has your sexual activity been affected by drugs, alcohol, medication, or emotional stress?: no Does patient have children?: Yes How many children?: 2 How is patient's relationship with their children?: Pretty good    History of alcohol / drug use?: Yes(Past hx of alcohol use)                    Allergies:   Allergies  Allergen Reactions  . Tramadol Other (See Comments)    On paperwork [facility] -Pt reports seizures   Lab Results:  Results for orders placed or performed during the hospital encounter of 04/18/19 (from the past 48 hour(s))  Glucose, capillary     Status: None   Collection Time: 04/19/19  5:40 AM  Result Value Ref Range   Glucose-Capillary 79 70 - 99 mg/dL    Blood Alcohol level:  No results found for: [facility]  Metabolic Disorder Labs:  No results found for: HGBA1C, MPG No results found for: PROLACTIN Lab Results  Component Value Date   TRIG 88 04/13/2019    Current Medications: Current Facility-Administered Medications  Medication Dose Route Frequency Provider Last Rate Last Dose  . acetaminophen (TYLENOL) tablet 650 mg  650 mg Oral Q6H PRN Laveda Abbe, NP   650 mg at 04/19/19 0941  . albuterol (VENTOLIN HFA) 108 (90 Base) MCG/ACT inhaler 2 puff  2 puff Inhalation Q4H PRN Nira Conn A, NP      . alum & mag hydroxide-simeth (MAALOX/MYLANTA) 200-200-20 MG/5ML suspension 30 mL  30 mL Oral Q4H PRN Laveda Abbe, NP      . dicyclomine (BENTYL) tablet 20 mg  20 mg Oral Q6H PRN Nira Conn A, NP      . famotidine (PEPCID) tablet 20 mg  20 mg  Oral BID Antonieta Pert, MD   20 mg at 04/19/19 0940  . hydrOXYzine (ATARAX/VISTARIL) tablet 25 mg  25 mg Oral TID PRN Laveda Abbe, NP      . lactulose (CHRONULAC) 10 GM/15ML solution 20 g  20 g Oral TID Nira Conn A, NP   20 g at 04/19/19 1206  . loperamide (IMODIUM) capsule 2-4 mg  2-4 mg Oral PRN Nira Conn A, NP      . LORazepam (ATIVAN) tablet 1 mg  1 mg Oral Q6H PRN Nira Conn A, NP      . magnesium hydroxide (MILK OF MAGNESIA) suspension 30 mL  30 mL Oral Daily PRN Laveda Abbe, NP      . menthol-cetylpyridinium (CEPACOL) lozenge 3 mg  1 lozenge Oral PRN Nira Conn A, NP      . methocarbamol (ROBAXIN) tablet 500 mg  500 mg Oral Q8H PRN Nira Conn A, NP      . multivitamin with minerals tablet 1 tablet  1 tablet Oral Daily Nira Conn A, NP   1 tablet at 04/19/19 0940  . mupirocin ointment (BACTROBAN) 2 % 1 application  1 application Nasal BID Jackelyn Poling, NP   1 application at 04/19/19 0940  . naproxen (NAPROSYN) tablet 500 mg  500 mg Oral BID PRN Jackelyn Poling, NP      . ondansetron (ZOFRAN-ODT) disintegrating tablet 4 mg  4 mg Oral Q6H PRN Nira Conn A, NP      . phenol (CHLORASEPTIC) mouth spray 1 spray  1 spray Mouth/Throat PRN Nira Conn A, NP      . sertraline (ZOLOFT) tablet 25 mg  25 mg Oral Daily Antonieta Pert, MD      . thiamine (B-1) injection 100 mg  100 mg Intramuscular Once Nira Conn A, NP      . thiamine (VITAMIN B-1) tablet 100 mg  100 mg Oral Daily Nira Conn A, NP   100 mg at 04/19/19 0940   PTA Medications: Medications Prior to Admission  Medication Sig Dispense Refill Last Dose  . atorvastatin (LIPITOR) 40 MG tablet Take 40 mg by mouth daily.   unkj  . cloNIDine (CATAPRES) 0.2 MG tablet Take 1 tablet (0.2 mg total) by mouth every 6 (six) hours. 80 tablet 0   . famotidine (PEPCID) 40 MG/5ML suspension Take 2.5 mLs (20 mg total) by mouth 2 (two) times daily. 50 mL 0   . furosemide (LASIX) 40 MG tablet Take 40 mg by  mouth daily.   unk  . gabapentin (NEURONTIN) 300 MG  capsule Take 300 mg by mouth 3 (three) times daily.   unk  . lactulose (CHRONULAC) 10 GM/15ML solution Take 20 g by mouth 3 (three) times daily.   04/12/2019  . LORazepam (ATIVAN) 1 MG tablet Take 1 tablet (1 mg total) by mouth every 6 (six) hours as needed for up to 5 days for anxiety. 20 tablet 0   . menthol-cetylpyridinium (CEPACOL) 3 MG lozenge Take 1 lozenge (3 mg total) by mouth as needed for sore throat. 100 tablet 12   . mupirocin ointment (BACTROBAN) 2 % Place 1 application into the nose 2 (two) times daily. 22 g 0   . phenol (CHLORASEPTIC) 1.4 % LIQD Use as directed 1 spray in the mouth or throat as needed for throat irritation / pain. 3 Bottle 0     Musculoskeletal: Strength & Muscle Tone: decreased Gait & Station: unsteady Patient leans: N/A  Psychiatric Specialty Exam: Physical Exam  Nursing note and vitals reviewed. Constitutional: He appears well-developed and well-nourished.  HENT:  Head: Normocephalic and atraumatic.  Respiratory: Effort normal.  Neurological: He is alert.    ROS  Blood pressure (!) 119/101, pulse 83, temperature 98 F (36.7 C), temperature source Oral, resp. rate 20, height  (1.753 m), weight 87.3 kg, SpO2 100 %.Body mass index is 28.41 kg/m.  General Appearance: Disheveled  Eye Contact:  Fair  Speech:  Normal Rate  Volume:  Decreased  Mood:  Depressed  Affect:  Congruent  Thought Process:  Coherent and Descriptions of Associations: Circumstantial  Orientation:  Full (Time, Place, and Person)  Thought Content:  Logical  Suicidal Thoughts:  No  Homicidal Thoughts:  No  Memory:  Immediate;   Fair Recent;   Fair Remote;   Fair  Judgement:  Impaired  Insight:  Lacking  Psychomotor Activity:  Psychomotor Retardation  Concentration:  Concentration: Fair and Attention Span: Fair  Recall:  [employer] of Knowledge:  Fair  Language:  Fair  Akathisia:  Negative  Handed:  Right  AIMS (if  indicated):     Assets:  Desire for Improvement Resilience  ADL's:  Impaired  Cognition:  Impaired,  Mild  Sleep:  Number of Hours: 6    Treatment Plan Summary: Daily contact with patient to assess and evaluate symptoms and progress in treatment, Medication management and Plan : Patient is seen and examined.  Patient is a 58 year old male with the above-stated past psychiatric history who is transferred to our facility for evaluation and stabilization after an intentional overdose.  Given his multiple medical illnesses I suspect the better part of valor is to start him on Zoloft.  We will start at 25 mg p.o. daily.  We will titrate that during the course the hospitalization.  He has several issues that are ongoing that we need further information on.  He is significantly anemic, and we will repeat his CBC with differential as well as iron studies.  His ammonia is elevated, and he continues on lactulose 20 g p.o. 3 times daily.  We will follow his ammonia basically on a daily basis.  He denied any recent alcohol abuse, but we will continue the lorazepam 1 mg p.o. every 6 hours as needed and a CIWA greater than 10.  His blood pressure has been low, and it sounds like he has had a syncopal work-up in the past.  We will attempt to get those records from [facility].  He will also be monitored for opiate withdrawal, and we will attempt  to treat that symptomatically but without clonidine given his blood pressure and syncopal issues in the past.  We will also obtain a physical therapy or occupational therapy consult.  This will be taken care of on Monday.  We will give him a wheelchair or walker currently.  From the look of the old medical record it does appear that housing will be a problem, and social work will become involved in his discharge planning.  He is also leukopenic but his HIV was negative on the medical side of the hospital.  Hopefully we can improve his current situation.  Observation  Level/Precautions:  15 minute checks  Laboratory:  Chemistry Profile  Psychotherapy:    Medications:    Consultations:    Discharge Concerns:    Estimated LOS:  Other:     Physician Treatment Plan for Primary Diagnosis: <principal problem not specified> Long Term Goal(s): Improvement in symptoms so as ready for discharge  Short Term Goals: Ability to identify changes in lifestyle to reduce recurrence of condition will improve, Ability to verbalize feelings will improve, Ability to disclose and discuss suicidal ideas, Ability to demonstrate self-control will improve, Ability to identify and develop effective coping behaviors will improve and Ability to maintain clinical measurements within normal limits will improve  Physician Treatment Plan for Secondary Diagnosis: Active Problems:   MDD (major depressive disorder), recurrent severe, without psychosis (HCC)  Long Term Goal(s): Improvement in symptoms so as ready for discharge  Short Term Goals: Ability to identify changes in lifestyle to reduce recurrence of condition will improve, Ability to verbalize feelings will improve, Ability to disclose and discuss suicidal ideas, Ability to demonstrate self-control will improve, Ability to identify and develop effective coping behaviors will improve and Ability to maintain clinical measurements within normal limits will improve  I certify that inpatient services furnished can reasonably be expected to improve the patient's condition.    Antonieta Pert, MD 4/25/202012:14 PM

## 2019-04-19 NOTE — [facility] Group Notes (Signed)
LCSW Group Therapy Note  04/19/2019    10:00-11:00am   Type of Therapy and Topic:  Group Therapy: Early Messages Received About Anger  Participation Level:  Active   Description of Group:   In this group, patients shared and discussed the early messages received in their lives about anger through parental or other adult modeling, teaching, repression, punishment, violence, and more.  Participants identified how those childhood lessons influence even now how they usually or often react when angered.  The group discussed that anger is a secondary emotion and what may be the underlying emotional themes that come out through anger outbursts or that are ignored through anger suppression.  Finally, as a group there was a conversation about the workbook's quote that "There is nothing wrong with anger; it is just a sign something needs to change."     Therapeutic Goals: 1. Patients will identify one or more childhood message about anger that they received and how it was taught to them. 2. Patients will discuss how these childhood experiences have influenced and continue to influence their own expression or repression of anger even today. 3. Patients will explore possible primary emotions that tend to fuel their secondary emotion of anger. 4. Patients will learn that anger itself is normal and cannot be eliminated, and that healthier coping skills can assist with resolving conflict rather than worsening situations.  Summary of Patient Progress:  The patient shared that his childhood lessons about anger were getting spankings for misbehavings.  As a result, he parented the same way. He reflected that he past may have influence his relationship problems with his wife. He is now separated and wants to work to eventually repair his relationship with his wife.  Therapeutic Modalities:   Cognitive Behavioral Therapy Motivation Interviewing  Henrene Dodge

## 2019-04-19 NOTE — Plan of Care (Signed)
1:1 note  D: pt found in bed; compliant with medication administration. Pt is visibly anxious and complaining of lower back pain from his fall. Pt is on 1;1 for safety. Pt denies si/hi/ah/vh and verbally agrees to approach staff if these become apparent or before harming himself/others while at [facility].  A: pt provided support and encouragement. Pt given medication per protocol and standing orders. Q19m safety checks implemented and continued.  R: pt safe on the unit. Will continue to monitor.   Pt progressing in the following metrics  Problem: Education: Goal: Knowledge of [facility] Education information/materials will improve Outcome: Progressing Goal: Emotional status will improve Outcome: Progressing Goal: Mental status will improve Outcome: Progressing Goal: Verbalization of understanding the information provided will improve Outcome: Progressing

## 2019-04-19 NOTE — Progress Notes (Signed)
OT Cancellation Note  Patient Details Name: Zerion Thell MRN: [id number] DOB: 1961-01-19   Cancelled Treatment:    Reason Eval/Treat Not Completed: Other (comment). Noted PT spoke with RN about this Pt this morning and recommended they have pt use RW. [facility] acute rehab supervisor touch base with PT to see if there are any OT needs.   Ignacia Palma, OTR/L Acute Rehab Services Pager [phone] Office [phone]     Evette Georges 04/19/2019, 11:41 AM

## 2019-04-20 LAB — COMPREHENSIVE METABOLIC PANEL
ALT: 35 U/L (ref 0–44)
AST: 58 U/L — ABNORMAL HIGH (ref 15–41)
Albumin: 3 g/dL — ABNORMAL LOW (ref 3.5–5.0)
Alkaline Phosphatase: 139 U/L — ABNORMAL HIGH (ref 38–126)
Anion gap: 6 (ref 5–15)
BUN: 16 mg/dL (ref 6–20)
CO2: 21 mmol/L — ABNORMAL LOW (ref 22–32)
Calcium: 8.2 mg/dL — ABNORMAL LOW (ref 8.9–10.3)
Chloride: 112 mmol/L — ABNORMAL HIGH (ref 98–111)
Creatinine, Ser: 0.69 mg/dL (ref 0.61–1.24)
GFR calc Af Amer: >60 mL/min (ref >60)
GFR calc non Af Amer: >60 mL/min (ref >60)
Glucose, Bld: 101 mg/dL — ABNORMAL HIGH (ref 70–99)
Potassium: 3.9 mmol/L (ref 3.5–5.1)
Sodium: 139 mmol/L (ref 135–145)
Total Bilirubin: 1.1 mg/dL (ref 0.3–1.2)
Total Protein: 7 g/dL (ref 6.5–8.1)

## 2019-04-20 MED ORDER — GABAPENTIN 100 MG PO CAPS
100.0000 mg | ORAL_CAPSULE | Freq: Three times a day (TID) | ORAL | Status: DC
  Administered 2019-04-20 – 2019-04-21 (×4): 100 mg via ORAL
  Filled 2019-04-20 (×7): qty 1

## 2019-04-20 MED ORDER — SERTRALINE HCL 50 MG PO TABS
50.0000 mg | ORAL_TABLET | Freq: Every day | ORAL | Status: DC
  Administered 2019-04-21 – 2019-04-27 (×7): 50 mg via ORAL
  Filled 2019-04-20: qty 1
  Filled 2019-04-20: qty 14
  Filled 2019-04-20 (×2): qty 1
  Filled 2019-04-20: qty 14
  Filled 2019-04-20: qty 1
  Filled 2019-04-20 (×2): qty 14
  Filled 2019-04-20 (×2): qty 1

## 2019-04-20 MED ORDER — LIDOCAINE 5 % EX PTCH
1.0000 | MEDICATED_PATCH | Freq: Every day | CUTANEOUS | Status: DC
  Administered 2019-04-20 – 2019-04-27 (×8): 1 via TRANSDERMAL
  Filled 2019-04-20 (×11): qty 1

## 2019-04-20 NOTE — Progress Notes (Signed)
1:1 Nursing Note   D. Pt is calm and cooperative, friendly upon approach-continues to complain of poor sleep and reports "pins and needles" back of right leg. Per pt's self inventory pt rates his depression, hopelessness and anxiety all 0's.Pt currently denies SI/HI and AV hallucinations A. Labs and vitals monitored. Pt compliant with medications. Pt supported emotionally and encouraged to express concerns and ask questions.   R. Pt remains safe with 15 minute checks.  Gait belt in place, 1:1 sitter remains with pt for safety Will continue POC.

## 2019-04-20 NOTE — Progress Notes (Signed)
1:1 note     Pt is pleasant on approach and cooperative   He complained of the lactulose and said he didn't want to take it   He has an unsteady gait and generalized weakness    Pt is on 1:1 for safety  He is wearing a gait belt     Pt remains safe at this time  [place] NOVEL CORONAVIRUS (COVID-19) DAILY CHECK-OFF SYMPTOMS - answer yes or no to each - every day NO YES  Have you had a fever in the past 24 hours?  . Fever (Temp > 37.80C / 100F) X   Have you had any of these symptoms in the past 24 hours? . New Cough .  Sore Throat  .  Shortness of Breath .  Difficulty Breathing .  Unexplained Body Aches   X   Have you had any one of these symptoms in the past 24 hours not related to allergies?   . Runny Nose .  Nasal Congestion .  Sneezing   X   If you have had runny nose, nasal congestion, sneezing in the past 24 hours, has it worsened?  X   EXPOSURES - check yes or no X   Have you traveled outside the state in the past 14 days?  X   Have you been in contact with someone with a confirmed diagnosis of COVID-19 or PUI in the past 14 days without wearing appropriate PPE?  X   Have you been living in the same home as a person with confirmed diagnosis of COVID-19 or a PUI (household contact)?    X   Have you been diagnosed with COVID-19?    X              What to do next: Answered NO to all: Answered YES to anything:   Proceed with unit schedule Follow the [facility] Inpatient Flowsheet.

## 2019-04-20 NOTE — Progress Notes (Signed)
1:1 Nursing Note  D.  Pt has been sleeping off and on so far, is very light sleeper.  MHT that is on 1:1 with Pt reports that Pt nearly fell again when he got up from dayroom to go to bed.  MHT reports that Pt did not say anything about being dizzy, but began to lean sideways.  Pt was wearing gaitbelt and MHT was able to steady Pt quickly.  Pt was able to go to room with assistance, and stated to MHT that he felt dizzy at that time.  If not reminded Pt does not always utilize walker.  Pt appears to have some mild confusion at times.  Pt is presently resting in bed in no apparent distress.  A.  1:1 continued as ordered to maintain Pt safety.  R. Pt remains safe on unit.

## 2019-04-20 NOTE — Progress Notes (Signed)
[facility] Group Notes:  (Nursing/MHT/Case Management/Adjunct)  Date:  04/20/2019  Time:  2030  Type of Therapy:  wrap up group  Participation Level:  Active  Participation Quality:  Appropriate, Attentive, Sharing and Supportive  Affect:  Appropriate  Cognitive:  Appropriate  Insight:  Improving  Engagement in Group:  Engaged  Modes of Intervention:  Clarification, Education and Support  Summary of Progress/Problems: Pt shared that he had a good talk with his sister today. If pt could change any one thing it would be his over dose last Sunday. Pt is grateful for this group of people.   Johann Capers S 04/20/2019, 10:01 PM

## 2019-04-20 NOTE — Progress Notes (Signed)
[facility] MD Progress Note  04/20/2019 11:03 AM Taylor Olson  MRN:  [id number]   Subjective:  Taylor Olson reports " feeling slightly better today."  Evaluation: Taylor Olson placed on a one-to-one for safety.  He is awake alert and oriented x3.  Reports remorse and guilt for his attempted overdose.  Stated his father and grandfather committed suicide so he thought that was the way to go.  Reports marital stressors which is caused some of his depression.  Currently denying suicidal homicidal ideations.  Patient does report right side leg pain and lower back pain.  Discussed initiating gabapentin and Lidoderm patch.  [facility]  Reports group session has been helpful since he was admitted.  Rates his depression 7 out of 10 with 10 being the worst.  Reports a good appetite.  States he is resting well throughout the night.  Support encouragement reassurance was provided.  History:  Per admission assessment note: [name] is a 58 year old male with a past psychiatric history significant for history of alcohol dependence, hepatitis, portal hypertension, previous pulmonary emboli, and previous episodes of syncope who originally presented to the [facility] emergency department on 04/13/2019 after an intentional overdose of baclofen. Patient was transferred from [facility] and was placed in the ICU. He had episodes of delirium and received propofol as well as Precedex.  Principal Problem: MDD (major depressive disorder), recurrent severe, without psychosis (HCC) Diagnosis: Principal Problem:   MDD (major depressive disorder), recurrent severe, without psychosis (HCC)  Total Time spent with patient: 15 minutes  Past Psychiatric History:   Past Medical History:  Past Medical History:  Diagnosis Date  . COPD (chronic obstructive pulmonary disease) (HCC)   . GERD (gastroesophageal reflux disease)   . History of blood clots     Past Surgical History:  Procedure  Laterality Date  . CARDIAC CATHETERIZATION     Family History: History reviewed. No pertinent family history. Family Psychiatric  History:  Social History:  Social History   Substance and Sexual Activity  Alcohol Use Not Currently  . Frequency: Never     Social History   Substance and Sexual Activity  Drug Use Never    Social History   Socioeconomic History  . Marital status: Married    Spouse name: Not on file  . Number of children: 2  . Years of education: Not on file  . Highest education level: Not on file  Occupational History  . Not on file  Social Needs  . Financial resource strain: Patient refused  . Food insecurity:    Worry: Patient refused    Inability: Patient refused  . Transportation needs:    Medical: Patient refused    Non-medical: Patient refused  Tobacco Use  . Smoking status: Former Games developer  . Smokeless tobacco: Never Used  Substance and Sexual Activity  . Alcohol use: Not Currently    Frequency: Never  . Drug use: Never  . Sexual activity: Not Currently  Lifestyle  . Physical activity:    Days per week: Patient refused    Minutes per session: Patient refused  . Stress: Not on file  Relationships  . Social connections:    Talks on phone: Patient refused    Gets together: Patient refused    Attends religious service: Patient refused    Active member of club or organization: Patient refused    Attends meetings of clubs or organizations: Patient refused    Relationship status: Patient refused  Other Topics  Concern  . Not on file  Social History Narrative  . Not on file   Additional Social History:    History of alcohol / drug use?: Yes(Past hx of alcohol use)                    Sleep: Fair  Appetite:  Fair  Current Medications: Current Facility-Administered Medications  Medication Dose Route Frequency Provider Last Rate Last Dose  . albuterol (VNira ConnFA) 108 (90 Base) MCG/ACT inhaler 2 puff  2 puff Inhalation Q4H PRN  Berry, Jason A, NP   2 puff at 04/19/19 2122  . alum & mag hydroxide-simeth (MAALOX/MYLANTA) 200-200-20 MG/5ML suspension 30 mL  30 mL Oral Q4H PRN Laveda Abbe, NP      . dicyclomine (BENTYL) tablet 20 mg  20 mg Oral Q6H PRN Nira Conn A, NP   20 mg at 04/19/19 1741  . famotidine (PEPCID) tablet 20 mg  20 mg Oral BID Antonieta Pert, MD   20 mg at 04/19/19 1720  . gabapentin (NEURONTIN) capsule 100 mg  100 mg Oral TID Oneta Rack, NP      . hydrOXYzine (ATARAX/VISTARIL) tablet 25 mg  25 mg Oral TID PRN Laveda Abbe, NP      . lactulose (CHRONULAC) 10 GM/15ML solution 20 g  20 g Oral TID Nira Conn A, NP   20 g at 04/19/19 1720  . lidocaine (LIDODERM) 5 % 1 patch  1 patch Transdermal Daily Hillery Jacks N, NP      . loperamide (IMODIUM) capsule 2-4 mg  2-4 mg Oral PRN Nira Conn A, NP      . LORazepam (ATIVAN) tablet 1 mg  1 mg Oral Q6H PRN Nira Conn A, NP   1 mg at 04/19/19 2119  . magnesium hydroxide (MILK OF MAGNESIA) suspension 30 mL  30 mL Oral Daily PRN Laveda Abbe, NP      . menthol-cetylpyridinium (CEPACOL) lozenge 3 mg  1 lozenge Oral PRN Nira Conn A, NP      . methocarbamol (ROBAXIN) tablet 500 mg  500 mg Oral Q8H PRN Nira Conn A, NP   500 mg at 04/19/19 2119  . multivitamin with minerals tablet 1 tablet  1 tablet Oral Daily Nira Conn A, NP   1 tablet at 04/20/19 0810  . mupirocin ointment (BACTROBAN) 2 % 1 application  1 application Nasal BID Nira Conn A, NP   1 application at 04/19/19 0940  . naproxen (NAPROSYN) tablet 500 mg  500 mg Oral BID PRN Jackelyn Poling, NP   500 mg at 04/19/19 2119  . ondansetron (ZOFRAN-ODT) disintegrating tablet 4 mg  4 mg Oral Q6H PRN Nira Conn A, NP      . phenol (CHLORASEPTIC) mouth spray 1 spray  1 spray Mouth/Throat PRN Nira Conn A, NP   1 spray at 04/19/19 1324  . [START ON 04/21/2019] sertraline (ZOLOFT) tablet 50 mg  50 mg Oral Daily Oneta Rack, NP      . thiamine (B-1) injection 100  mg  100 mg Intramuscular Once Nira Conn A, NP      . thiamine (VITAMIN B-1) tablet 100 mg  100 mg Oral Daily Nira Conn A, NP   100 mg at 04/20/19 4098    Lab Results:  Results for orders placed or performed during the hospital encounter of 04/18/19 (from the past 48 hour(s))  Glucose, capillary     Status: None   Collection Time:  04/19/19  5:40 AM  Result Value Ref Range   Glucose-Capillary 79 70 - 99 mg/dL  Vitamin Z61     Status: Abnormal   Collection Time: 04/19/19  8:06 AM  Result Value Ref Range   Vitamin B-12 105 (L) 180 - 914 pg/mL    Comment: (NOTE) This assay is not validated for testing neonatal or myeloproliferative syndrome specimens for Vitamin B12 levels. Performed at [facility], [address] [place], [place] [address]   Folate     Status: None   Collection Time: 04/19/19  8:06 AM  Result Value Ref Range   Folate 13.7 >5.9 ng/mL    Comment: Performed at [facility], [address] [place], [place] [address]  Iron and TIBC     Status: Abnormal   Collection Time: 04/19/19  8:06 AM  Result Value Ref Range   Iron 47 45 - 182 ug/dL   TIBC 119 147 - 829 ug/dL   Saturation Ratios 15 (L) 17.9 - 39.5 %   UIBC 260 ug/dL    Comment: Performed at [facility], [address], [place], [place] [address]  Ferritin     Status: Abnormal   Collection Time: 04/19/19  8:06 AM  Result Value Ref Range   Ferritin 23 (L) 24 - 336 ng/mL    Comment: Performed at [facility], [address], [place], [place] [address]  Reticulocytes     Status: Abnormal   Collection Time: 04/19/19  8:06 AM  Result Value Ref Range   Retic Ct Pct 2.9 0.4 - 3.1 %   RBC. 2.97 (L) 4.22 - 5.81 MIL/uL   Retic Count, Absolute 84.9 19.0 - 186.0 K/uL   Immature Retic Fract 12.7 2.3 - 15.9 %    Comment: Performed at [facility], [address] [place], [place] [address]  Ammonia     Status: Abnormal    Collection Time: 04/19/19  8:06 AM  Result Value Ref Range   Ammonia 38 (H) 9 - 35 umol/L    Comment: Performed at [facility], [address] [place], [place] [address]  Comprehensive metabolic panel     Status: Abnormal   Collection Time: 04/20/19  6:11 AM  Result Value Ref Range   Sodium 139 135 - 145 mmol/L   Potassium 3.9 3.5 - 5.1 mmol/L   Chloride 112 (H) 98 - 111 mmol/L   CO2 21 (L) 22 - 32 mmol/L   Glucose, Bld 101 (H) 70 - 99 mg/dL   BUN 16 6 - 20 mg/dL   Creatinine, Ser 4.13 0.61 - 1.24 mg/dL   Calcium 8.2 (L) 8.9 - 10.3 mg/dL   Total Protein 7.0 6.5 - 8.1 g/dL   Albumin 3.0 (L) 3.5 - 5.0 g/dL   AST 58 (H) 15 - 41 U/L   ALT 35 0 - 44 U/L   Alkaline Phosphatase 139 (H) 38 - 126 U/L   Total Bilirubin 1.1 0.3 - 1.2 mg/dL   GFR calc non Af Amer >60 >60 mL/min   GFR calc Af Amer >60 >60 mL/min   Anion gap 6 5 - 15    Comment: Performed at [facility], [address] [place], [place] [address]    Blood Alcohol level:  No results found for: [facility]  Metabolic Disorder Labs: No results found for: HGBA1C, MPG No results found for: PROLACTIN Lab Results  Component Value Date   TRIG 88 04/13/2019  Physical Findings: AIMS: Facial and Oral Movements Muscles of Facial Expression: None, normal Lips and Perioral Area: None, normal Jaw: None, normal Tongue: None, normal,Extremity Movements Upper (arms, wrists, hands, fingers): None, normal Lower (legs, knees, ankles, toes): None, normal, Trunk Movements Neck, shoulders, hips: None, normal, Overall Severity Severity of abnormal movements (highest score from questions above): None, normal Incapacitation due to abnormal movements: None, normal Patient's awareness of abnormal movements (rate only patient's report): No Awareness, Dental Status Current problems with teeth and/or dentures?: No Does patient usually wear dentures?: No  CIWA:  CIWA-Ar Total: 6 COWS:  COWS Total Score:  3  Musculoskeletal: Strength & Muscle Tone: decreased Gait & Station: unsteady Patient leans: N/A patient use walker for ambulation assistanc   Psychiatric Specialty Exam: Physical Exam  Constitutional: He appears well-developed.  Psychiatric: He has a normal mood and affect. His behavior is normal.    Review of Systems  Psychiatric/Behavioral: Negative for depression and suicidal ideas (currently denying ). The patient is not nervous/anxious.   All other systems reviewed and are negative.   Blood pressure 126/81, pulse 82, temperature 98.2 F (36.8 C), temperature source Oral, resp. rate 20, height 5\' 9"  (1.753 m), weight 87.3 kg, SpO2 100 %.Body mass index is 28.41 kg/m.  General Appearance: Casual paper scrubs  Eye Contact:  Fair  Speech:  Clear and Coherent  Volume:  Normal  Mood:  Anxious and Depressed  Affect:  Congruent  Thought Process:  Coherent  Orientation:  Full (Time, Place, and Person)  Thought Content:  Logical  Suicidal Thoughts:  No  Homicidal Thoughts:  No  Memory:  Immediate;   Fair Recent;   Fair  Judgement:  Fair  Insight:  Fair  Psychomotor Activity:  Normal  Concentration:  Concentration: Fair  Recall:  [employer]Fair  Fund of Knowledge:  Fair  Language:  Fair  Akathisia:  No  Handed:  Right  AIMS (if indicated):     Assets:  Communication Skills Desire for Improvement Financial Resources/Insurance  ADL's:  Intact  Cognition:  WNL  Sleep:  Number of Hours: 2.75     Treatment Plan Summary: Daily contact with patient to assess and evaluate symptoms and progress in treatment and Medication management   Continue her current treatment plan on 04/20/2019 as listed below except were noted.  Major depressive disorder:  Increase Zoloft 25 mg to 50 mg p.o. daily Initiated gabapentin 100 mg p.o. 3 times daily Initiated Lidoderm 5% for lower back  Pain   CIWA/ PRN for ETOH   CSW to continue working on discharge disposition Patient encouraged to  participate with the therapeutic milieu      Oneta Rackanika N Lewis, NP 04/20/2019, 11:03 AM

## 2019-04-20 NOTE — Progress Notes (Signed)
1:1 Nursing Note  Patient observed sitting in the dayroom with 1:1 sitter beside him. Pt voices no complaints at this time- Pt using front walker and gait belt for ambulation and 1:1 sitter for safety. Pt remains safe on the unit.

## 2019-04-20 NOTE — Progress Notes (Signed)
1:1 Nursing Note  D. Pt did not sleep very well last night, barely two hours recorded.  Pt states he would like to be ordered something for sleep tonight.  Pt is pleasant, remains unsteady.  Pt is utilizing front wheel walker for ambulation.  A.  Gait belt in place, sitter with pt as per 1:1 order for pt safety  R.  Pt remains safe on the unit.

## 2019-04-20 NOTE — Progress Notes (Signed)
1:1 Nursing Note  Patient observed interacting well with peers, sitting in dayroom with 1:1 sitter beside him . Pt voices no complaints at this time- reports relief from Lidocaine patch. Pt is friendly during interactions- continues to appear unsteady, using front wheel walker for ambulation with gait belt and 1:1 sitter for safety. Pt remains safe on the unit

## 2019-04-20 NOTE — [facility] Group Notes (Signed)
[facility] LCSW Group Therapy Note  Date/Time:  04/20/2019 9:00-10:00 or 10:00-11:00AM  Type of Therapy and Topic:  Group Therapy:  Healthy and Unhealthy Supports  Participation Level:  Active   Description of Group:  Patients in this group were introduced to the idea of adding a variety of healthy supports to address the various needs in their lives.Patients discussed what additional healthy supports could be helpful in their recovery and wellness after discharge in order to prevent future hospitalizations.   An emphasis was placed on using counselor, doctor, therapy groups, 12-step groups, and problem-specific support groups to expand supports.  They also worked as a group on developing a specific plan for several patients to deal with unhealthy supports through boundary-setting, psychoeducation with loved ones, and even termination of relationships.   Therapeutic Goals:   1)  discuss importance of adding supports to stay well once out of the hospital  2)  compare healthy versus unhealthy supports and identify some examples of each  3)  generate ideas and descriptions of healthy supports that can be added  4)  offer mutual support about how to address unhealthy supports  5)  encourage active participation in and adherence to discharge plan    Summary of Patient Progress:  The patient stated that current healthy supports in his  life are the groups he has attended and the [place] while current unhealthy supports include his brother -in-law.  The patient expressed a willingness to add therapy and other mental health services  as support to help in his recovery journey.   Therapeutic Modalities:   Motivational Interviewing Brief Solution-Focused Therapy  Evorn Gong

## 2019-04-20 NOTE — Progress Notes (Signed)
[place] NOVEL CORONAVIRUS (COVID-19) DAILY CHECK-OFF SYMPTOMS - answer yes or no to each - every day NO YES  Have you had a fever in the past 24 hours?  . Fever (Temp > 37.80C / 100F) X   Have you had any of these symptoms in the past 24 hours? . New Cough .  Sore Throat  .  Shortness of Breath .  Difficulty Breathing .  Unexplained Body Aches   X   Have you had any one of these symptoms in the past 24 hours not related to allergies?   . Runny Nose .  Nasal Congestion .  Sneezing   X   If you have had runny nose, nasal congestion, sneezing in the past 24 hours, has it worsened?  X   EXPOSURES - check yes or no X   Have you traveled outside the state in the past 14 days?  X   Have you been in contact with someone with a confirmed diagnosis of COVID-19 or PUI in the past 14 days without wearing appropriate PPE?  X   Have you been living in the same home as a person with confirmed diagnosis of COVID-19 or a PUI (household contact)?    X   Have you been diagnosed with COVID-19?    X              What to do next: Answered NO to all: Answered YES to anything:   Proceed with unit schedule Follow the [facility] Inpatient Flowsheet.   

## 2019-04-20 NOTE — [facility] Suicide Risk Assessment (Signed)
[facility] INPATIENT:  Family/Significant Other Suicide Prevention Education  Suicide Prevention Education:  Patient Refusal for Family/Significant Other Suicide Prevention Education: The patient Taylor Olson has refused to provide written consent for family/significant other to be provided Family/Significant Other Suicide Prevention Education during admission and/or prior to discharge. Suicide prevention education was provided to patient.   Physician notified.  Evorn Gong 04/20/2019, 9:47 AM

## 2019-04-21 LAB — COMPREHENSIVE METABOLIC PANEL
ALT: 32 U/L (ref 0–44)
AST: 51 U/L — ABNORMAL HIGH (ref 15–41)
Albumin: 3 g/dL — ABNORMAL LOW (ref 3.5–5.0)
Alkaline Phosphatase: 146 U/L — ABNORMAL HIGH (ref 38–126)
Anion gap: 6 (ref 5–15)
BUN: 17 mg/dL (ref 6–20)
CO2: 21 mmol/L — ABNORMAL LOW (ref 22–32)
Calcium: 8.3 mg/dL — ABNORMAL LOW (ref 8.9–10.3)
Chloride: 111 mmol/L (ref 98–111)
Creatinine, Ser: 0.86 mg/dL (ref 0.61–1.24)
GFR calc Af Amer: >60 mL/min (ref >60)
GFR calc non Af Amer: >60 mL/min (ref >60)
Glucose, Bld: 114 mg/dL — ABNORMAL HIGH (ref 70–99)
Potassium: 4.2 mmol/L (ref 3.5–5.1)
Sodium: 138 mmol/L (ref 135–145)
Total Bilirubin: 0.9 mg/dL (ref 0.3–1.2)
Total Protein: 7.1 g/dL (ref 6.5–8.1)

## 2019-04-21 LAB — CBC WITH DIFFERENTIAL/PLATELET
Abs Immature Granulocytes: 0.01 10*3/uL (ref 0.00–0.07)
Basophils Absolute: 0 10*3/uL (ref 0.0–0.1)
Basophils Relative: 0 %
Eosinophils Absolute: 0.2 10*3/uL (ref 0.0–0.5)
Eosinophils Relative: 5 %
HCT: 27.9 % — ABNORMAL LOW (ref 39.0–52.0)
Hemoglobin: 9 g/dL — ABNORMAL LOW (ref 13.0–17.0)
Immature Granulocytes: 0 %
Lymphocytes Relative: 29 %
Lymphs Abs: 0.9 10*3/uL (ref 0.7–4.0)
MCH: 33 pg (ref 26.0–34.0)
MCHC: 32.3 g/dL (ref 30.0–36.0)
MCV: 102.2 fL — ABNORMAL HIGH (ref 80.0–100.0)
Monocytes Absolute: 0.3 10*3/uL (ref 0.1–1.0)
Monocytes Relative: 10 %
Neutro Abs: 1.7 10*3/uL (ref 1.7–7.7)
Neutrophils Relative %: 56 %
Platelets: 125 10*3/uL — ABNORMAL LOW (ref 150–400)
RBC: 2.73 MIL/uL — ABNORMAL LOW (ref 4.22–5.81)
RDW: 15 % (ref 11.5–15.5)
WBC: 3.1 10*3/uL — ABNORMAL LOW (ref 4.0–10.5)
nRBC: 0 % (ref 0.0–0.2)

## 2019-04-21 LAB — AMMONIA: Ammonia: 59 umol/L — ABNORMAL HIGH (ref 9–35)

## 2019-04-21 MED ORDER — GABAPENTIN 100 MG PO CAPS
200.0000 mg | ORAL_CAPSULE | Freq: Three times a day (TID) | ORAL | Status: DC
  Administered 2019-04-21 – 2019-04-27 (×18): 200 mg via ORAL
  Filled 2019-04-21 (×2): qty 84
  Filled 2019-04-21 (×10): qty 2
  Filled 2019-04-21: qty 84
  Filled 2019-04-21 (×3): qty 2
  Filled 2019-04-21: qty 84
  Filled 2019-04-21: qty 2
  Filled 2019-04-21: qty 84
  Filled 2019-04-21: qty 2
  Filled 2019-04-21: qty 84
  Filled 2019-04-21: qty 2
  Filled 2019-04-21: qty 84
  Filled 2019-04-21: qty 2
  Filled 2019-04-21 (×2): qty 84

## 2019-04-21 NOTE — Progress Notes (Signed)
Patient ID: Taylor Olson, male   DOB: 09-Dec-1961, 58 y.o.   MRN: [id number] [place] NOVEL CORONAVIRUS (COVID-19) DAILY CHECK-OFF SYMPTOMS - answer yes or no to each - every day NO YES  Have you had a fever in the past 24 hours?  . Fever (Temp > 37.80C / 100F) X   Have you had any of these symptoms in the past 24 hours? . New Cough .  Sore Throat  .  Shortness of Breath .  Difficulty Breathing .  Unexplained Body Aches   X   Have you had any one of these symptoms in the past 24 hours not related to allergies?   . Runny Nose .  Nasal Congestion .  Sneezing   X   If you have had runny nose, nasal congestion, sneezing in the past 24 hours, has it worsened?  X   EXPOSURES - check yes or no X   Have you traveled outside the state in the past 14 days?  X   Have you been in contact with someone with a confirmed diagnosis of COVID-19 or PUI in the past 14 days without wearing appropriate PPE?  X   Have you been living in the same home as a person with confirmed diagnosis of COVID-19 or a PUI (household contact)?    X   Have you been diagnosed with COVID-19?    X              What to do next: Answered NO to all: Answered YES to anything:   Proceed with unit schedule Follow the [facility] Inpatient Flowsheet.

## 2019-04-21 NOTE — Progress Notes (Signed)
1:1 note  Pt is currently in the hallway near the nurses station with his sitter    He reports sleeping fair    He uses a walker   And he has a gait belt on   He is a 1:1 sitter due to history off fall this admission generalized weakness and unsteady gait   Safety maintained

## 2019-04-21 NOTE — Progress Notes (Signed)
1:1 note  Pt in bed resting with eyes closed   No distress noted   No complaints  Pt is on a 1:1 for safety and he is a high fall risk due to having an unsteady gait and history of falls and a fall this admission   He is presently safe

## 2019-04-21 NOTE — Tx Team (Addendum)
Interdisciplinary Treatment and Diagnostic Plan Update  04/21/2019 Time of Session: 0847  Taylor Olson MRN: [id number]  Principal Diagnosis: MDD (major depressive disorder), recurrent severe, without psychosis (HCC)  Secondary Diagnoses: Principal Problem:   MDD (major depressive disorder), recurrent severe, without psychosis (HCC)   Current Medications:  Current Facility-Administered Medications  Medication Dose Route Frequency Provider Last Rate Last Dose  . albuterol (VENTOLIN HFA) 108 (90 Base) MCG/ACT inhaler 2 puff  2 puff Inhalation Q4H PRN Nira Conn A, NP   2 puff at 04/19/19 2122  . alum & mag hydroxide-simeth (MAALOX/MYLANTA) 200-200-20 MG/5ML suspension 30 mL  30 mL Oral Q4H PRN Laveda Abbe, NP      . dicyclomine (BENTYL) tablet 20 mg  20 mg Oral Q6H PRN Nira Conn A, NP   20 mg at 04/19/19 1741  . famotidine (PEPCID) tablet 20 mg  20 mg Oral BID Antonieta Pert, MD   20 mg at 04/19/19 1720  . gabapentin (NEURONTIN) capsule 100 mg  100 mg Oral TID Oneta Rack, NP   100 mg at 04/21/19 0848  . hydrOXYzine (ATARAX/VISTARIL) tablet 25 mg  25 mg Oral TID PRN Laveda Abbe, NP   25 mg at 04/20/19 2137  . lactulose (CHRONULAC) 10 GM/15ML solution 20 g  20 g Oral TID Nira Conn A, NP   20 g at 04/21/19 0841  . lidocaine (LIDODERM) 5 % 1 patch  1 patch Transdermal Daily Oneta Rack, NP   1 patch at 04/21/19 0930  . loperamide (IMODIUM) capsule 2-4 mg  2-4 mg Oral PRN Nira Conn A, NP      . LORazepam (ATIVAN) tablet 1 mg  1 mg Oral Q6H PRN Nira Conn A, NP   1 mg at 04/20/19 2137  . magnesium hydroxide (MILK OF MAGNESIA) suspension 30 mL  30 mL Oral Daily PRN Laveda Abbe, NP      . menthol-cetylpyridinium (CEPACOL) lozenge 3 mg  1 lozenge Oral PRN Nira Conn A, NP      . methocarbamol (ROBAXIN) tablet 500 mg  500 mg Oral Q8H PRN Nira Conn A, NP   500 mg at 04/20/19 2137  . multivitamin with minerals tablet 1 tablet  1 tablet Oral  Daily Nira Conn A, NP   1 tablet at 04/21/19 0848  . mupirocin ointment (BACTROBAN) 2 % 1 application  1 application Nasal BID Nira Conn A, NP   1 application at 04/19/19 0940  . naproxen (NAPROSYN) tablet 500 mg  500 mg Oral BID PRN Nira Conn A, NP   500 mg at 04/21/19 0847  . ondansetron (ZOFRAN-ODT) disintegrating tablet 4 mg  4 mg Oral Q6H PRN Nira Conn A, NP      . phenol (CHLORASEPTIC) mouth spray 1 spray  1 spray Mouth/Throat PRN Nira Conn A, NP   1 spray at 04/21/19 0843  . sertraline (ZOLOFT) tablet 50 mg  50 mg Oral Daily Oneta Rack, NP   50 mg at 04/21/19 0844  . thiamine (B-1) injection 100 mg  100 mg Intramuscular Once Nira Conn A, NP      . thiamine (VITAMIN B-1) tablet 100 mg  100 mg Oral Daily Nira Conn A, NP   100 mg at 04/21/19 4098   PTA Medications: Medications Prior to Admission  Medication Sig Dispense Refill Last Dose  . atorvastatin (LIPITOR) 40 MG tablet Take 40 mg by mouth daily.   unkj  . cloNIDine (CATAPRES) 0.2 MG tablet Take 1  tablet (0.2 mg total) by mouth every 6 (six) hours. 80 tablet 0   . famotidine (PEPCID) 40 MG/5ML suspension Take 2.5 mLs (20 mg total) by mouth 2 (two) times daily. 50 mL 0   . furosemide (LASIX) 40 MG tablet Take 40 mg by mouth daily.   unk  . gabapentin (NEURONTIN) 300 MG capsule Take 300 mg by mouth 3 (three) times daily.   unk  . lactulose (CHRONULAC) 10 GM/15ML solution Take 20 g by mouth 3 (three) times daily.   04/12/2019  . LORazepam (ATIVAN) 1 MG tablet Take 1 tablet (1 mg total) by mouth every 6 (six) hours as needed for up to 5 days for anxiety. 20 tablet 0   . menthol-cetylpyridinium (CEPACOL) 3 MG lozenge Take 1 lozenge (3 mg total) by mouth as needed for sore throat. 100 tablet 12   . mupirocin ointment (BACTROBAN) 2 % Place 1 application into the nose 2 (two) times daily. 22 g 0   . phenol (CHLORASEPTIC) 1.4 % LIQD Use as directed 1 spray in the mouth or throat as needed for throat irritation / pain. 3  Bottle 0     Patient Stressors: Financial difficulties Health problems Marital or family conflict  Patient Strengths: Average or above average intelligence Communication skills  Treatment Modalities: Medication Management, Group therapy, Case management,  1 to 1 session with clinician, Psychoeducation, Recreational therapy.   Physician Treatment Plan for Primary Diagnosis: MDD (major depressive disorder), recurrent severe, without psychosis (HCC) Long Term Goal(s): Improvement in symptoms so as ready for discharge Improvement in symptoms so as ready for discharge   Short Term Goals: Ability to identify changes in lifestyle to reduce recurrence of condition will improve Ability to verbalize feelings will improve Ability to disclose and discuss suicidal ideas Ability to demonstrate self-control will improve Ability to identify and develop effective coping behaviors will improve Ability to maintain clinical measurements within normal limits will improve Ability to identify changes in lifestyle to reduce recurrence of condition will improve Ability to verbalize feelings will improve Ability to disclose and discuss suicidal ideas Ability to demonstrate self-control will improve Ability to identify and develop effective coping behaviors will improve Ability to maintain clinical measurements within normal limits will improve  Medication Management: Evaluate patient's response, side effects, and tolerance of medication regimen.  Therapeutic Interventions: 1 to 1 sessions, Unit Group sessions and Medication administration.  Evaluation of Outcomes: Progressing  Physician Treatment Plan for Secondary Diagnosis: Principal Problem:   MDD (major depressive disorder), recurrent severe, without psychosis (HCC)  Long Term Goal(s): Improvement in symptoms so as ready for discharge Improvement in symptoms so as ready for discharge   Short Term Goals: Ability to identify changes in lifestyle  to reduce recurrence of condition will improve Ability to verbalize feelings will improve Ability to disclose and discuss suicidal ideas Ability to demonstrate self-control will improve Ability to identify and develop effective coping behaviors will improve Ability to maintain clinical measurements within normal limits will improve Ability to identify changes in lifestyle to reduce recurrence of condition will improve Ability to verbalize feelings will improve Ability to disclose and discuss suicidal ideas Ability to demonstrate self-control will improve Ability to identify and develop effective coping behaviors will improve Ability to maintain clinical measurements within normal limits will improve     Medication Management: Evaluate patient's response, side effects, and tolerance of medication regimen.  Therapeutic Interventions: 1 to 1 sessions, Unit Group sessions and Medication administration.  Evaluation of Outcomes: Progressing  RN Treatment Plan for Primary Diagnosis: MDD (major depressive disorder), recurrent severe, without psychosis (HCC) Long Term Goal(s): Knowledge of disease and therapeutic regimen to maintain health will improve  Short Term Goals: Ability to participate in decision making will improve, Ability to verbalize feelings will improve, Ability to disclose and discuss suicidal ideas, Ability to identify and develop effective coping behaviors will improve and Compliance with prescribed medications will improve  Medication Management: RN will administer medications as ordered by provider, will assess and evaluate patient's response and provide education to patient for prescribed medication. RN will report any adverse and/or side effects to prescribing provider.  Therapeutic Interventions: 1 on 1 counseling sessions, Psychoeducation, Medication administration, Evaluate responses to treatment, Monitor vital signs and CBGs as ordered, Perform/monitor CIWA, COWS, AIMS  and Fall Risk screenings as ordered, Perform wound care treatments as ordered.  Evaluation of Outcomes: Progressing   LCSW Treatment Plan for Primary Diagnosis: MDD (major depressive disorder), recurrent severe, without psychosis (HCC) Long Term Goal(s): Safe transition to appropriate next level of care at discharge, Engage patient in therapeutic group addressing interpersonal concerns.  Short Term Goals: Engage patient in aftercare planning with referrals and resources and Increase skills for wellness and recovery  Therapeutic Interventions: Assess for all discharge needs, 1 to 1 time with Social worker, Explore available resources and support systems, Assess for adequacy in community support network, Educate family and significant other(s) on suicide prevention, Complete Psychosocial Assessment, Interpersonal group therapy.  Evaluation of Outcomes: Progressing   Progress in Treatment: Attending groups: Yes. Participating in groups: Yes. Taking medication as prescribed: No. Toleration medication: No. Family/Significant other contact made: No, will contact:  consent for son, but no answer  Patient understands diagnosis: Yes. Discussing patient identified problems/goals with staff: Yes. Medical problems stabilized or resolved: Yes. Denies suicidal/homicidal ideation: Yes. Issues/concerns per patient self-inventory: No. Other:   New problem(s) identified: No, Describe:  None  New Short Term/Long Term Goal(s):  medication management for mood stabilization; elimination of SI thoughts; development of comprehensive mental wellness/sobriety plan.  Patient Goals:  "Get a support group going"  Discharge Plan or Barriers: [place]  Reason for Continuation of Hospitalization: Medical Issues Medication stabilization  Estimated Length of Stay: Discharge Saturday, 04/26/2019  Attendees: Patient: Taylor PorterRobert Olson 04/21/2019   Physician: Dr.  Landry MellowGreg Clary, MD 04/21/2019   Nursing: Dewayne ShorterAlyssa Johnson, RN 04/21/2019   RN Care Manager: 04/21/2019   Social Worker: Stephannie PetersJasmine Davis, LCSW [place] Daegan Arizmendi, [place] 04/21/2019   Recreational Therapist:  04/21/2019   Other:  04/21/2019   Other:  04/21/2019   Other: 04/21/2019      Scribe for Treatment Team: Delphia GratesJasmine M Davis, LCSW 04/21/2019 11:06 AM

## 2019-04-21 NOTE — Progress Notes (Signed)
Pt attended spiritual care group on grief and loss facilitated by chaplain Burnis Kingfisher   Group opened with brief discussion and psycho-social ed around grief and loss in relationships and in relation to self - identifying life patterns, circumstances, changes that cause losses. Established group norm of speaking from own life experience. Group goal of establishing open and affirming space for members to share loss and experience with grief, normalize grief experience and provide psycho social education and grief support.    Jazarion was present through 70% of group.  Present with sitter.  Did not engage in group discussion.   Burnis Kingfisher, MDiv, [facility]

## 2019-04-21 NOTE — Progress Notes (Signed)
Patient ID: Taylor Olson, male   DOB: 19-Jun-1961, 58 y.o.   MRN: [id number] D) Pt is appropriate and cooperative on approach. Pt c/o decreased sleep and leg pain, otherwise no c/o. Denies s.i. verbally contracts fro safety. A) Level 1 obs supported due to high fall risk. Sitter at bedside. Gait belt in place. Med ed reinforced. R) Cooperative. Appropriate.

## 2019-04-21 NOTE — Progress Notes (Signed)
Patient ID: Taylor Olson, male   DOB: 07/19/1961, 58 y.o.   MRN: [id number]  D) Pt has been appropriate and cooperative on approach. Pleasant, animated. Sitter at side. Positive for all unit activities with minimal prompting. Pt rates his depression a 0, hopelessness a 0, and anxiety a 0 today. Pt goal to work on finding support group. C/o chronic pain to right leg for which he has Lidocaine patch qd. Pt ambulating well with walker. Gait belt in place. denies s.i. A) level 1 obs supported due to fall risk. Support and encouragement provided. Med e reinforced. R) Receptive.

## 2019-04-21 NOTE — Progress Notes (Signed)
[facility] MD Progress Note  04/21/2019 12:17 PM Taylor Olson  MRN:  [id number] Subjective:  Patient is a 58 year old male with a past psychiatric history significant for history of alcohol dependence, hepatitis, portal hypertension, previous pulmonary emboli, and previous episodes of syncope who originally presented to the [facility] emergency department on 04/13/2019 after an intentional overdose of baclofen.   Objective: Patient is seen and examined.  Patient is a 58 year old male with the above-stated past psychiatric history who is seen in follow-up.  From a psychiatric standpoint he is doing well.  He denied any suicidal ideation.  He denied any helplessness, hopelessness and worthlessness.  He continues to have his medical problems, but they are stable at this point.  His ammonia has dropped to 38 on 04/19/2019.  His creatinine is stable at 0.69.  His liver function enzymes have as well decreased to an AST of 58 and ALT of 35.  His anemia work-up shows a ferritin that is decreased at 23, folic acid that is low at 13.7, and an iron is 47.  His TIBC was normal at 307.  His hemoglobin and hematocrit were low on admission at 8.2 and 24.6.  We need to repeat those.  His platelets are also decreased at 86,000.  Those will be repeated today as well.  His vital signs are stable, he is afebrile.  He does complain of chronic pain.  He slept 4.5 hours last night, and we discussed the possibility of medications for sleep tonight. Principal Problem: MDD (major depressive disorder), recurrent severe, without psychosis (HCC) Diagnosis: Principal Problem:   MDD (major depressive disorder), recurrent severe, without psychosis (HCC)  Total Time spent with patient: 20 minutes  Past Psychiatric History: See admission H&P  Past Medical History:  Past Medical History:  Diagnosis Date  . COPD (chronic obstructive pulmonary disease) (HCC)   . GERD (gastroesophageal reflux disease)   . History of blood clots     Past  Surgical History:  Procedure Laterality Date  . CARDIAC CATHETERIZATION     Family History: History reviewed. No pertinent family history. Family Psychiatric  History: See admission H&P Social History:  Social History   Substance and Sexual Activity  Alcohol Use Not Currently  . Frequency: Never     Social History   Substance and Sexual Activity  Drug Use Never    Social History   Socioeconomic History  . Marital status: Married    Spouse name: Not on file  . Number of children: 2  . Years of education: Not on file  . Highest education level: Not on file  Occupational History  . Not on file  Social Needs  . Financial resource strain: Patient refused  . Food insecurity:    Worry: Patient refused    Inability: Patient refused  . Transportation needs:    Medical: Patient refused    Non-medical: Patient refused  Tobacco Use  . Smoking status: Former Games developer  . Smokeless tobacco: Never Used  Substance and Sexual Activity  . Alcohol use: Not Currently    Frequency: Never  . Drug use: Never  . Sexual activity: Not Currently  Lifestyle  . Physical activity:    Days per week: Patient refused    Minutes per session: Patient refused  . Stress: Not on file  Relationships  . Social connections:    Talks on phone: Patient refused    Gets together: Patient refused    Attends religious service: Patient refused    Active member of club  or organization: Patient refused    Attends meetings of clubs or organizations: Patient refused    Relationship status: Patient refused  Other Topics Concern  . Not on file  Social History Narrative  . Not on file   Additional Social History:    History of alcohol / drug use?: Yes(Past hx of alcohol use)                    Sleep: Fair  Appetite:  Good  Current Medications: Current Facility-Administered Medications  Medication Dose Route Frequency Provider Last Rate Last Dose  . albuterol (VENTOLIN HFA) 108 (90 Base)  MCG/ACT inhaler 2 puff  2 puff Inhalation Q4H PRN Nira Conn A, NP   2 puff at 04/19/19 2122  . alum & mag hydroxide-simeth (MAALOX/MYLANTA) 200-200-20 MG/5ML suspension 30 mL  30 mL Oral Q4H PRN Laveda Abbe, NP      . dicyclomine (BENTYL) tablet 20 mg  20 mg Oral Q6H PRN Nira Conn A, NP   20 mg at 04/19/19 1741  . famotidine (PEPCID) tablet 20 mg  20 mg Oral BID Antonieta Pert, MD   20 mg at 04/19/19 1720  . gabapentin (NEURONTIN) capsule 100 mg  100 mg Oral TID Oneta Rack, NP   100 mg at 04/21/19 1202  . hydrOXYzine (ATARAX/VISTARIL) tablet 25 mg  25 mg Oral TID PRN Laveda Abbe, NP   25 mg at 04/20/19 2137  . lactulose (CHRONULAC) 10 GM/15ML solution 20 g  20 g Oral TID Nira Conn A, NP   20 g at 04/21/19 1202  . lidocaine (LIDODERM) 5 % 1 patch  1 patch Transdermal Daily Oneta Rack, NP   1 patch at 04/21/19 0930  . loperamide (IMODIUM) capsule 2-4 mg  2-4 mg Oral PRN Nira Conn A, NP      . LORazepam (ATIVAN) tablet 1 mg  1 mg Oral Q6H PRN Nira Conn A, NP   1 mg at 04/20/19 2137  . magnesium hydroxide (MILK OF MAGNESIA) suspension 30 mL  30 mL Oral Daily PRN Laveda Abbe, NP      . menthol-cetylpyridinium (CEPACOL) lozenge 3 mg  1 lozenge Oral PRN Nira Conn A, NP      . methocarbamol (ROBAXIN) tablet 500 mg  500 mg Oral Q8H PRN Nira Conn A, NP   500 mg at 04/20/19 2137  . multivitamin with minerals tablet 1 tablet  1 tablet Oral Daily Nira Conn A, NP   1 tablet at 04/21/19 0848  . mupirocin ointment (BACTROBAN) 2 % 1 application  1 application Nasal BID Nira Conn A, NP   1 application at 04/19/19 0940  . naproxen (NAPROSYN) tablet 500 mg  500 mg Oral BID PRN Nira Conn A, NP   500 mg at 04/21/19 0847  . ondansetron (ZOFRAN-ODT) disintegrating tablet 4 mg  4 mg Oral Q6H PRN Nira Conn A, NP      . phenol (CHLORASEPTIC) mouth spray 1 spray  1 spray Mouth/Throat PRN Nira Conn A, NP   1 spray at 04/21/19 0843  . sertraline  (ZOLOFT) tablet 50 mg  50 mg Oral Daily Oneta Rack, NP   50 mg at 04/21/19 0844  . thiamine (B-1) injection 100 mg  100 mg Intramuscular Once Nira Conn A, NP      . thiamine (VITAMIN B-1) tablet 100 mg  100 mg Oral Daily Nira Conn A, NP   100 mg at 04/21/19 [phone]  Lab Results:  Results for orders placed or performed during the hospital encounter of 04/18/19 (from the past 48 hour(s))  Comprehensive metabolic panel     Status: Abnormal   Collection Time: 04/20/19  6:11 AM  Result Value Ref Range   Sodium 139 135 - 145 mmol/L   Potassium 3.9 3.5 - 5.1 mmol/L   Chloride 112 (H) 98 - 111 mmol/L   CO2 21 (L) 22 - 32 mmol/L   Glucose, Bld 101 (H) 70 - 99 mg/dL   BUN 16 6 - 20 mg/dL   Creatinine, Ser 1.61 0.61 - 1.24 mg/dL   Calcium 8.2 (L) 8.9 - 10.3 mg/dL   Total Protein 7.0 6.5 - 8.1 g/dL   Albumin 3.0 (L) 3.5 - 5.0 g/dL   AST 58 (H) 15 - 41 U/L   ALT 35 0 - 44 U/L   Alkaline Phosphatase 139 (H) 38 - 126 U/L   Total Bilirubin 1.1 0.3 - 1.2 mg/dL   GFR calc non Af Amer >60 >60 mL/min   GFR calc Af Amer >60 >60 mL/min   Anion gap 6 5 - 15    Comment: Performed at [facility], [address], [place], [place] [address]    Blood Alcohol level:  No results found for: [facility]  Metabolic Disorder Labs: No results found for: HGBA1C, MPG No results found for: PROLACTIN Lab Results  Component Value Date   TRIG 88 04/13/2019    Physical Findings: AIMS: Facial and Oral Movements Muscles of Facial Expression: None, normal Lips and Perioral Area: None, normal Jaw: None, normal Tongue: None, normal,Extremity Movements Upper (arms, wrists, hands, fingers): None, normal Lower (legs, knees, ankles, toes): None, normal, Trunk Movements Neck, shoulders, hips: None, normal, Overall Severity Severity of abnormal movements (highest score from questions above): None, normal Incapacitation due to abnormal movements: None, normal Patient's awareness of abnormal  movements (rate only patient's report): No Awareness, Dental Status Current problems with teeth and/or dentures?: No Does patient usually wear dentures?: No  CIWA:  CIWA-Ar Total: 3 COWS:  COWS Total Score: 0  Musculoskeletal: Strength & Muscle Tone: decreased Gait & Station: unsteady Patient leans: N/A  Psychiatric Specialty Exam: Physical Exam  Nursing note and vitals reviewed. Constitutional: He is oriented to person, place, and time. He appears well-developed and well-nourished.  HENT:  Head: Normocephalic and atraumatic.  Respiratory: Effort normal.  Neurological: He is alert and oriented to person, place, and time.    ROS  Blood pressure 123/88, pulse 79, temperature 98.6 F (37 C), temperature source Oral, resp. rate 20, height  (1.753 m), weight 87.3 kg, SpO2 100 %.Body mass index is 28.41 kg/m.  General Appearance: Casual  Eye Contact:  Fair  Speech:  Normal Rate  Volume:  Normal  Mood:  Euthymic  Affect:  Congruent  Thought Process:  Coherent and Descriptions of Associations: Intact  Orientation:  Full (Time, Place, and Person)  Thought Content:  Logical  Suicidal Thoughts:  No  Homicidal Thoughts:  No  Memory:  Immediate;   Fair Recent;   Fair Remote;   Fair  Judgement:  Intact  Insight:  Lacking  Psychomotor Activity:  Normal  Concentration:  Concentration: Fair and Attention Span: Fair  Recall:  [employer] of Knowledge:  Fair  Language:  Fair  Akathisia:  Negative  Handed:  Right  AIMS (if indicated):     Assets:  Desire for Improvement Resilience  ADL's:  Intact  Cognition:  WNL  Sleep:  Number of Hours: 4.5     Treatment Plan Summary: Daily contact with patient to assess and evaluate symptoms and progress in treatment, Medication management and Plan : Patient is seen and examined.  Patient is a 58 year old male with the above-stated past psychiatric history who is seen in follow-up.   Diagnosis: #1 major depression, recurrent, moderate to  severe without psychotic features, #2 cirrhosis of the liver, #3 congestive heart failure, #4 COPD, #5 portal hypertension, #6 history of pulmonary emboli, #7 history of delirium/metabolic encephalopathy, #8 chronic pain, #9 anemia, #10 history of hepatitis  Patient is continuing to slowly improve.  His mentation is better.  His depression is better.  His physical problems outweigh his mental problems at this point.  We had a long discussion today about his follow-up.  [facility], and I have asked him to contact them today to get his appointment within 1 to 2 weeks.  We will repeat his CBC with differential as well as his liver function enzymes and ammonia in the p.m. today.  Hopefully they are improving.  No change in his medications except the addition of Seroquel 25 to 50 mg p.o. nightly for insomnia. 1.  Continue sertraline 50 mg p.o. daily for depression and anxiety. 2.  Continue Bentyl 20 mg p.o. every 6 hours as needed abdominal cramping. 3.  Continue Pepcid 20 mg p.o. twice daily for abdominal pain and gastric protection. 4.  Increase Neurontin to 200 mg p.o. 3 times daily for chronic pain. 5.  Continue hydroxyzine 25 mg p.o. 3 times daily as needed anxiety. 6.  Continue lactulose 20 g p.o. 3 times daily for elevated ammonia. 7.  Decrease lorazepam to 1 mg p.o. every 8 hours PRN CIWA greater than 10. 8.  Continue Robaxin 500 mg p.o. every 8 hours as needed pain and muscle spasms. 9.  Continue multivitamin with minerals 1 tablet p.o. daily for nutritional supplementation. 10.  Continue Naprosyn 500 mg p.o. twice daily as needed pain. 11.  Continue thiamine 100 mg p.o. daily for nutritional supplementation. 12.  Seroquel 25 mg 1-2 tabs p.o. nightly as needed insomnia. 13.  Repeat CBC with differential as well as liver function enzymes and ammonia this p.m. 14.  Disposition planning-from a psychiatric standpoint patient will be ready for discharge in 1 to 2  days.  Antonieta PertGreg Lawson Mozelle Remlinger, MD 04/21/2019, 12:17 PM

## 2019-04-21 NOTE — Progress Notes (Signed)
[name] joined Orthoptist and two other patients in prayer.  HE was invited by a fellow patient.  When speaking about what they wished to pray for, Taylor Olson noted that he had just arrived and was not sure of his prayers.

## 2019-04-21 NOTE — Progress Notes (Signed)
Patient ID: Taylor Olson, male   DOB: 08-06-1961, 58 y.o.   MRN: [id number] D) Pt has been bright, appropriate and cooperative. Active in the milieu. Interacting appropriately with staff and peers. Appetite good. Fluid intake good. No complaints. Denies s.i. walker and gait belt utilized. A) level 1 obs support for fall risk. Sitter at side. Med ed reinforced. Support provided. R) Receptive.

## 2019-04-21 NOTE — Progress Notes (Signed)
Adult Psychoeducational Group Note  Date:  04/21/2019 Time:  6:19 PM  Group Topic/Focus:  Goals Group:   The focus of this group is to help patients establish daily goals to achieve during treatment and discuss how the patient can incorporate goal setting into their daily lives to aide in recovery.  Participation Level:  Active  Participation Quality:  Appropriate  Affect:  Appropriate  Cognitive:  Alert and Appropriate  Insight: Appropriate  Engagement in Group:  Engaged  Modes of Intervention:  Discussion  Additional Comments:  Pt attend group and participated in discussion.  Faustine Tates R Marivel Mcclarty 04/21/2019, 6:19 PM

## 2019-04-21 NOTE — Progress Notes (Signed)
Recreation Therapy Notes  Date:  4.27.20 Time: 0930 Location: 300 Hall Dayroom  Group Topic: Stress Management  Goal Area(s) Addresses:  Patient will identify positive stress management techniques. Patient will identify benefits of using stress management post d/c.  Behavioral Response:  Engaged     Intervention: Stress Management  Activity :  Meditation.  LRT introduced the stress management technique of meditation.  LRT played a meditation that dealt with being resilient in the face of adversity.  Patients were to listen and follow along as meditation played.  Education:  Stress Management, Discharge Planning.   Education Outcome: Acknowledges Education  Clinical Observations/Feedback:  Pt attended and participated in group.     Caroll Rancher, LRT/CTRS     Caroll Rancher A 04/21/2019 10:54 AM

## 2019-04-22 DIAGNOSIS — F332 Major depressive disorder, recurrent severe without psychotic features: Principal | ICD-10-CM

## 2019-04-22 MED ORDER — MIRTAZAPINE 7.5 MG PO TABS
7.5000 mg | ORAL_TABLET | Freq: Every day | ORAL | Status: DC
  Administered 2019-04-22 – 2019-04-26 (×5): 7.5 mg via ORAL
  Filled 2019-04-22 (×2): qty 1
  Filled 2019-04-22 (×2): qty 14
  Filled 2019-04-22: qty 1
  Filled 2019-04-22: qty 14
  Filled 2019-04-22: qty 1
  Filled 2019-04-22: qty 14
  Filled 2019-04-22 (×2): qty 1

## 2019-04-22 NOTE — Progress Notes (Signed)
OT Cancellation Note  Patient Details Name: Portland Minten MRN: [id number] DOB: 12-02-1961   Cancelled Treatment:    Reason Eval/Treat Not Completed: PT screened, no needs identified, will sign off  Darcey Cardy 04/22/2019, 3:52 PM  Marica Otter, OTR/L Acute Rehabilitation Services [phone] [facility] pager [phone] office 04/22/2019

## 2019-04-22 NOTE — Progress Notes (Addendum)
[facility]  04/22/2019 1:39 PM Rolland PorterRobert Braaksma  MRN:  [id number] Subjective:  "I need medicine for sleep."  Mr. Christell ConstantMoore found resting in bed with 1:1 sitter at bedside. He reports improving mood today but reports continued difficulty sleeping. He requests medication to help with sleep. He does admit suicidal intent behind recent overdose prior to admission. He states overdose was impulsive and related to conflict with brother-in-law which has been resolved at this time. Denies SI/HI. He reports he has been socializing in the dayroom. Patient is observed to be unsteady on feet while ambulating. Patient reports difficulty ambulating for about a year now- states he has not seen a physician or PT/OT about this. [place] and [facility] PT/OT consults are pending. Denies withdrawal symptoms.  From admission H&P: Patient is a 58 year old male with a past history significant for  alcohol dependence, hepatitis, portal hypertension, previous pulmonary emboli, and previous episodes of syncope who [facility] 04/[facility]/2019 after an intentional overdose of baclofen. [facility] in [facility]. He had episodes of delirium and received propofol as well as Precedex. In my interview today the patient stated that he was unaware that he had done the overdose, he said it was accidental. There are notes in the chart that state that this was an intentional overdose to kill himself.   Principal Problem: MDD (major depressive disorder), recurrent severe, without psychosis (HCC) Diagnosis: Principal Problem:   MDD (major depressive disorder), recurrent severe, without psychosis (HCC)  Total Time spent with patient: 15 minutes  Past Psychiatric History: See admission H&P  Past Medical History:  Past Medical History:  Diagnosis Date  . COPD (chronic obstructive pulmonary disease)  (HCC)   . GERD (gastroesophageal reflux disease)   . History of blood clots     Past Surgical History:  Procedure Laterality Date  . CARDIAC CATHETERIZATION     Family History: History reviewed. No pertinent family history. Family Psychiatric  History: See admission H&P Social History:  Social History   Substance and Sexual Activity  Alcohol Use Not Currently  . Frequency: Never     Social History   Substance and Sexual Activity  Drug Use Never    Social History   Socioeconomic History  . Marital status: Married    Spouse name: Not on file  . Number of children: 2  . Years of education: Not on file  . Highest education level: Not on file  Occupational History  . Not on file  Social Needs  . Financial resource strain: Patient refused  . Food insecurity:    Worry: Patient refused    Inability: Patient refused  . Transportation needs:    Medical: Patient refused    Non-medical: Patient refused  Tobacco Use  . Smoking status: Former Games developermoker  . Smokeless tobacco: Never Used  Substance and Sexual Activity  . Alcohol use: Not Currently    Frequency: Never  . Drug use: Never  . Sexual activity: Not Currently  Lifestyle  . Physical activity:    Days per week: Patient refused    Minutes per session: Patient refused  . Stress: Not on file  Relationships  . Social connections:    Talks on phone: Patient refused    Gets together: Patient refused    Attends religious service: Patient refused    Active member of club or organization: Patient refused  Attends meetings of clubs or organizations: Patient refused    Relationship status: Patient refused  Other Topics Concern  . Not on file  Social History Narrative  . Not on file   Additional Social History:    History of alcohol / drug use?: Yes(Past hx of alcohol use)                    Sleep: Poor  Appetite:  Good  Current Medications: Current Facility-Administered Medications  Medication Dose  Route Frequency Provider Last Rate Last Dose  . albuterol (VENTOLIN HFA) 108 (90 Base) MCG/ACT inhaler 2 puff  2 puff Inhalation Q4H PRN Nira Conn A, NP   2 puff at 04/22/19 0751  . alum & mag hydroxide-simeth (MAALOX/MYLANTA) 200-200-20 MG/5ML suspension 30 mL  30 mL Oral Q4H PRN Laveda Abbe, NP      . dicyclomine (BENTYL) tablet 20 mg  20 mg Oral Q6H PRN Nira Conn A, NP   20 mg at 04/21/19 2248  . famotidine (PEPCID) tablet 20 mg  20 mg Oral BID Antonieta Pert, MD   20 mg at 04/21/19 1704  . gabapentin (NEURONTIN) capsule 200 mg  200 mg Oral TID Antonieta Pert, MD   200 mg at 04/22/19 1221  . hydrOXYzine (ATARAX/VISTARIL) tablet 25 mg  25 mg Oral TID PRN Laveda Abbe, NP   25 mg at 04/21/19 2248  . lactulose (CHRONULAC) 10 GM/15ML solution 20 g  20 g Oral TID Nira Conn A, NP   20 g at 04/22/19 1221  . lidocaine (LIDODERM) 5 % 1 patch  1 patch Transdermal Daily Oneta Rack, NP   1 patch at 04/22/19 0751  . magnesium hydroxide (MILK OF MAGNESIA) suspension 30 mL  30 mL Oral Daily PRN Laveda Abbe, NP      . menthol-cetylpyridinium (CEPACOL) lozenge 3 mg  1 lozenge Oral PRN Nira Conn A, NP      . methocarbamol (ROBAXIN) tablet 500 mg  500 mg Oral Q8H PRN Nira Conn A, NP   500 mg at 04/20/19 2137  . multivitamin with minerals tablet 1 tablet  1 tablet Oral Daily Nira Conn A, NP   1 tablet at 04/22/19 0752  . mupirocin ointment (BACTROBAN) 2 % 1 application  1 application Nasal BID Nira Conn A, NP   1 application at 04/19/19 0940  . naproxen (NAPROSYN) tablet 500 mg  500 mg Oral BID PRN Nira Conn A, NP   500 mg at 04/21/19 0847  . phenol (CHLORASEPTIC) mouth spray 1 spray  1 spray Mouth/Throat PRN Nira Conn A, NP   1 spray at 04/22/19 1211  . sertraline (ZOLOFT) tablet 50 mg  50 mg Oral Daily Oneta Rack, NP   50 mg at 04/22/19 0751  . thiamine (B-1) injection 100 mg  100 mg Intramuscular Once Nira Conn A, NP      . thiamine  (VITAMIN B-1) tablet 100 mg  100 mg Oral Daily Nira Conn A, NP   100 mg at 04/22/19 0751    Lab Results:  Results for orders placed or performed during the hospital encounter of 04/18/19 (from the past 48 hour(s))  CBC with Differential/Platelet     Status: Abnormal   Collection Time: 04/21/19  6:37 PM  Result Value Ref Range   WBC 3.1 (L) 4.0 - 10.5 K/uL   RBC 2.73 (L) 4.22 - 5.81 MIL/uL   Hemoglobin 9.0 (L) 13.0 - 17.0 g/dL  HCT 27.9 (L) 39.0 - 52.0 %   MCV 102.2 (H) 80.0 - 100.0 fL   MCH 33.0 26.0 - 34.0 pg   MCHC 32.3 30.0 - 36.0 g/dL   RDW 16.1 09.6 - 04.5 %   Platelets 125 (L) 150 - 400 K/uL    Comment: REPEATED TO VERIFY Immature Platelet Fraction may be clinically indicated, consider ordering this additional test [id number]    nRBC 0.0 0.0 - 0.2 %   Neutrophils Relative % 56 %   Neutro Abs 1.7 1.7 - 7.7 K/uL   Lymphocytes Relative 29 %   Lymphs Abs 0.9 0.7 - 4.0 K/uL   Monocytes Relative 10 %   Monocytes Absolute 0.3 0.1 - 1.0 K/uL   Eosinophils Relative 5 %   Eosinophils Absolute 0.2 0.0 - 0.5 K/uL   Basophils Relative 0 %   Basophils Absolute 0.0 0.0 - 0.1 K/uL   Immature Granulocytes 0 %   Abs Immature Granulocytes 0.01 0.00 - 0.07 K/uL    Comment: Performed at [facility], [address] [place], [place] [address]  Ammonia     Status: Abnormal   Collection Time: 04/21/19  6:37 PM  Result Value Ref Range   Ammonia 59 (H) 9 - 35 umol/L    Comment: Performed at [facility], [address], [place], [place] [address]  Comprehensive metabolic panel     Status: Abnormal   Collection Time: 04/21/19  6:37 PM  Result Value Ref Range   Sodium 138 135 - 145 mmol/L   Potassium 4.2 3.5 - 5.1 mmol/L   Chloride 111 98 - 111 mmol/L   CO2 21 (L) 22 - 32 mmol/L   Glucose, Bld 114 (H) 70 - 99 mg/dL   BUN 17 6 - 20 mg/dL   Creatinine, Ser 0.86 0.61 - 1.24 mg/dL   Calcium 8.3 (L) 8.9 - 10.3 mg/dL   Total Protein 7.1 6.5 - 8.1 g/dL    Albumin 3.0 (L) 3.5 - 5.0 g/dL   AST 51 (H) 15 - 41 U/L   ALT 32 0 - 44 U/L   Alkaline Phosphatase 146 (H) 38 - 126 U/L   Total Bilirubin 0.9 0.3 - 1.2 mg/dL   GFR calc non Af Amer >60 >60 mL/min   GFR calc Af Amer >60 >60 mL/min   Anion gap 6 5 - 15    Comment: Performed at [facility], [address], [place], [place] [address]    Blood Alcohol level:  No results found for: [facility]  Metabolic Disorder Labs: No results found for: HGBA1C, MPG No results found for: PROLACTIN Lab Results  Component Value Date   TRIG 88 04/13/2019    Physical Findings: AIMS: Facial and Oral Movements Muscles of Facial Expression: None, normal Lips and Perioral Area: None, normal Jaw: None, normal Tongue: None, normal,Extremity Movements Upper (arms, wrists, hands, fingers): None, normal Lower (legs, knees, ankles, toes): None, normal, Trunk Movements Neck, shoulders, hips: None, normal, Overall Severity Severity of abnormal movements (highest score from questions above): None, normal Incapacitation due to abnormal movements: None, normal Patient's awareness of abnormal movements (rate only patient's report): No Awareness, Dental Status Current problems with teeth and/or dentures?: No Does patient usually wear dentures?: No  CIWA:  CIWA-Ar Total: 2 COWS:  COWS Total Score: 0  Musculoskeletal: Strength & Muscle Tone: decreased Gait & Station: unsteady Patient leans: N/A  Psychiatric Specialty Exam: Physical Exam  Nursing note and vitals reviewed. Constitutional: He is  oriented to person, place, and time. He appears well-developed and well-nourished.  Cardiovascular: Normal rate.  Respiratory: Effort normal.  Neurological: He is alert and oriented to person, place, and time.    Review of Systems  Constitutional: Negative.   Psychiatric/Behavioral: Positive for depression and substance abuse (hx ETOH). Negative for hallucinations and suicidal ideas. The patient has  insomnia. The patient is not nervous/anxious.     Blood pressure (!) 132/94, pulse 78, temperature 98.1 F (36.7 C), temperature source Oral, resp. rate 20, height  (1.753 m), weight 87.3 kg, SpO2 100 %.Body mass index is 28.41 kg/m.  General Appearance: Disheveled  Eye Contact:  Good  Speech:  Clear and Coherent  Volume:  Normal  Mood:  Euthymic  Affect:  Congruent  Thought Process:  Coherent  Orientation:  Full (Time, Place, and Person)  Thought Content:  WDL  Suicidal Thoughts:  No  Homicidal Thoughts:  No  Memory:  Immediate;   Fair Recent;   Fair  Judgement:  Intact  Insight:  Fair  Psychomotor Activity:  Normal  Concentration:  Concentration: Fair  Recall:  [employer] of Knowledge:  Fair  Language:  Fair  Akathisia:  No  Handed:  Right  AIMS (if indicated):     Assets:  Communication Skills Desire for Improvement Housing Resilience Social Support  ADL's:  Intact  Cognition:  WNL  Sleep:  Number of Hours: 5.25     Treatment Plan Summary: Daily contact with patient to assess and evaluate symptoms and progress in treatment and Medication management   Continue inpatient hospitalization.  PT/OT consults pending  Start Remeron 7.5 mg PO QHS for sleep/mood Continue Zoloft 50 mg PO daily for mood Continue Bentyl 20 mg PO Q6HR PRN cramping Continue Pepcid 20 mg PO BID for gastric protection Continue Neurontin 200 mg PO TID for chronic pain Continue lidocaine 5% patch trandermal over 12 hours daily for pain Continue Vistaril 25 mg PO TID PRN anxiety Continue lactulose 20 g PO TID for high ammonia Continue Robaxin 500 mg PO Q8HR PRN pain Continue thiamine 100 mg PO daily for supplementation.  Patient will participate in the therapeutic group milieu.  Discharge disposition in progress.   Aldean Baker, NP 04/22/2019, 1:39 PM   Agree with NP Progress Note

## 2019-04-22 NOTE — Progress Notes (Signed)
1:1 Note Pt complained of not falling a sleep, kept waking up going to the bathroom. Pt eventually got up and was observed seating in the open area in front of nurses station. No fall, no unwanted behaviors, will continue to monitor.

## 2019-04-22 NOTE — [facility] Group Notes (Signed)
[facility] Group Notes:  (Nursing/MHT/Case Management/Adjunct)  Date:  04/22/2019  Time:  4:00 PM  Type of Therapy:  Nurse Education  Participation Level:  Active  Participation Quality:  Appropriate and Attentive  Affect:  Appropriate  Cognitive:  Alert and Appropriate  Insight:  Appropriate  Engagement in Group:  Engaged and Improving  Modes of Intervention:  Discussion and Education  Summary of Progress/Problems: pt's discussed mindfulness and how this can advance/hinder their plan of care   Raylene Miyamoto 04/22/2019, 5:54 PM

## 2019-04-22 NOTE — Progress Notes (Signed)
1:1 Note Pt at the dayroom at this time interacting with peers. Pt has been appropriate, although complained about not being started on Seroquel as the doctor had promised. no fall witnessed or reported. Remains on 1:1 obs, will continue to monitor.

## 2019-04-22 NOTE — Progress Notes (Signed)
1:1 note  Pt has been on the unit interacting with the milieu. Pt compliant with medications. Pt assessed by PT. Pt said to be steady. Pt denies si/hi/ah/vh and verbally agrees to approach staff if these become apparent or before harming himself/others while at [facility]. Pt is still noncompliant at times with his walker per his sitter. Sitter within arms reach. Pt denies any physical pain or symptoms. 1:1 continues for safety. q16m safety checks implemented and continued.

## 2019-04-22 NOTE — Progress Notes (Signed)
1:1 Note Pt observed in the dayroom with peers. Pt stated he has been going to the cafeteria, when told he could not go because there was no doctor's for him to go to the cafeteria, Pt became irritable but was redirected. No falls observed or reported, remains on 1:1, will continue to monitor.

## 2019-04-22 NOTE — Progress Notes (Signed)
Physical Therapy Treatment Patient Details Name: Eddiel Than MRN: [id number] DOB: [birth year] Today's Date: 04/22/2019    History of Present Illness Pt is a 58 y/o male transferred from [facility] secondary to acute toxic/metabolic encephalopathy.  Presumed secondary to drug overdose intentional versus unintentional. Pt intubated upon arrival and was extubated 4/20. PMH includes alcoholic cirrhosis, PE, COPD, portal HTN, and Hepatitis A.     PT Comments    Pt ambulated 600' with RW with no loss of balance outside on cement surface. Pt reports h/o 7 falls in past 4 months, these are associated with dizziness and at times "blacking out". Pt reports he's blacked out in standing and sitting position. Pt reports 8/10 back and RLE pain and weakness.    Follow Up Recommendations  Outpatient PT(to address back pain with RLE weakness & radiculopathy)     Equipment Recommendations  Rolling walker with 5" wheels    Recommendations for Other Services       Precautions / Restrictions Precautions Precautions: Fall Precaution Comments: h/o 7 falls in past 4 months, pt reports dizziness/blacking out with falls, blacking out has happened in sitting and in standing Restrictions Weight Bearing Restrictions: No    Mobility  Bed Mobility               General bed mobility comments: pt up walking with RW with 1:1 on unit  Transfers Overall transfer level: Needs assistance Equipment used: Rolling walker (2 wheeled) Transfers: Sit to/from Stand Sit to Stand: Supervision         General transfer comment: Supervision for safety due to multiple recent falls  Ambulation/Gait Ambulation/Gait assistance: Supervision Gait Distance (Feet): 600 Feet Assistive device: Rolling walker (2 wheeled) Gait Pattern/deviations: WFL(Within Functional Limits) Gait velocity: WFL   General Gait Details: no loss of balance, pt ambulated outside on cement patio with good attention to uneven  areas   Stairs             Wheelchair Mobility    Modified Rankin (Stroke Patients Only)       Balance Overall balance assessment: Needs assistance;History of Falls(pt reports 7 falls in past 4 months, dizziness/blacking out associated with falls) Sitting-balance support: No upper extremity supported;Feet supported Sitting balance-Leahy Scale: Good     Standing balance support: No upper extremity supported;During functional activity;Bilateral upper extremity supported Standing balance-Leahy Scale: Good                              Cognition Arousal/Alertness: Awake/alert Behavior During Therapy: WFL for tasks assessed/performed Overall Cognitive Status: Within Functional Limits for tasks assessed                                        Exercises      General Comments General comments (skin integrity, edema, etc.): R hip flexion 3/5, knee ext -4/5, ankle DF -4/5; LLE 5/5 throughout      Pertinent Vitals/Pain Pain Assessment: 0-10 Pain Score: 8  Pain Location: R thigh  Pain Descriptors / Indicators: Burning Pain Intervention(s): Limited activity within patient's tolerance;Monitored during session;Premedicated before session(wearing a lidocaine patch for pain)    Home Living                      Prior Function            PT Goals (  current goals can now be found in the care plan section) Acute Rehab PT Goals Patient Stated Goal: to get stronger, stop falling PT Goal Formulation: With patient Time For Goal Achievement: 05/01/19 Potential to Achieve Goals: Good Progress towards PT goals: Progressing toward goals    Frequency    Min 3X/week      PT Plan Current plan remains appropriate    Co-evaluation              AM-PAC PT "6 Clicks" Mobility   Outcome Measure  Help needed turning from your back to your side while in a flat bed without using bedrails?: None Help needed moving from lying on your back to  sitting on the side of a flat bed without using bedrails?: None Help needed moving to and from a bed to a chair (including a wheelchair)?: A Little Help needed standing up from a chair using your arms (e.g., wheelchair or bedside chair)?: None Help needed to walk in hospital room?: A Little Help needed climbing 3-5 steps with a railing? : A Little 6 Click Score: 21    End of Session Equipment Utilized During Treatment: Gait belt Activity Tolerance: Patient tolerated treatment well Patient left: Other (comment)(pt seated at picnic table outside on patio with 1:1 sitter) Nurse Communication: Mobility status PT Visit Diagnosis: Unsteadiness on feet (R26.81);Muscle weakness (generalized) (M62.81);Other abnormalities of gait and mobility (R26.89)     Time: [id number] PT Time Calculation (min) (ACUTE ONLY): 23 min  Charges:  $Gait Training: 23-37 mins                     Ralene BatheUhlenberg, Tyray Proch Kistler PT 04/22/2019  Acute Rehabilitation Services Pager [phone] Office [phone]

## 2019-04-22 NOTE — Progress Notes (Signed)
The focus of this group is to help patients establish daily goals to achieve during treatment and discuss how the patient can incorporate goal setting into their daily lives to aide in recovery. 

## 2019-04-22 NOTE — Plan of Care (Signed)
1:1 note  Pt found in the dayroom, sitter within arms reach. Pt states he is having lower back pain. Medication provided per protocol and standing orders. Pt denies any other symptoms. Pt does complain of insomnia. Pt is anxious that he may have to stay longer because he wasn't started on Seroquel. Pt provided reassurance. Pt denies si/hi/ah/vh and verbally agrees to approach staff if these become apparent or before harming himself/others while at [facility]. Pt ambulates with his walker. Pt still unsteady in his gait. Pt is unaware/noncompliant with his limitations. Pt safe on the unit. Q54m safety checks implemented and continued. 1:1 continues for safety.   Problem: Activity: Goal: Interest or engagement in activities will improve Outcome: Progressing   Problem: Coping: Goal: Ability to verbalize frustrations and anger appropriately will improve Outcome: Progressing Goal: Ability to demonstrate self-control will improve Outcome: Progressing   Problem: Health Behavior/Discharge Planning: Goal: Identification of resources available to assist in meeting health care needs will improve Outcome: Progressing

## 2019-04-22 NOTE — Plan of Care (Signed)
1:1 note  Pt has been resting most of the morning. Pt ambulating with walker and interacting in the dayroom now. Pt denies any physical complaints besides lower back pain. Pt is with sitter. Sitter states that pt has been non-compliant with his walker sometimes. Pt will be seen by ot/pt. Pt safe on the unit. Q5m safety checks implemented and continued. 1:1 continues for safety. This Clinical research associate spoke with the provider and we feel it is unsafe for the pt to leave the unit as he did last night for dinner. 1:1 (entailing UR) continues.    Problem: Activity: Goal: Interest or engagement in activities will improve Outcome: Progressing Goal: Sleeping patterns will improve Outcome: Progressing   Problem: Coping: Goal: Ability to verbalize frustrations and anger appropriately will improve Outcome: Progressing Goal: Ability to demonstrate self-control will improve Outcome: Progressing   Problem: Health Behavior/Discharge Planning: Goal: Identification of resources available to assist in meeting health care needs will improve Outcome: Progressing Goal: Compliance with treatment plan for underlying cause of condition will improve Outcome: Progressing   Problem: Physical Regulation: Goal: Ability to maintain clinical measurements within normal limits will improve Outcome: Progressing   Problem: Safety: Goal: Periods of time without injury will increase Outcome: Progressing

## 2019-04-23 NOTE — [facility] Group Notes (Signed)
Occupational Therapy Group Note  Date:  04/23/2019 Time:  11:34 AM  Group Topic/Focus:  Stress Management  Participation Level:  Active  Participation Quality:  Appropriate  Affect:  Flat  Cognitive:  Appropriate  Insight: Improving  Engagement in Group:  Engaged  Modes of Intervention:  Activity, Discussion, Education and Socialization  Additional Comments:    S: "I like deep breathing and relaxation, and using social support"  O: Stress management group completed to use as productive coping strategy, to help mitigate maladaptive coping to integrate in functional BADL/IADL. Stress management tool worksheet discussed to educate on unhealthy vs healthy coping skills to manage stress to improve community integration. Coping strategies taught include: relaxation based- deep breathing, counting to 10, taking a 1 minute vacation, acceptance, stress balls, relaxation audio/video, visual/mental imagery. Positive mental attitude- gratitude, acceptance, cognitive reframing, positive self talk, anger management.  A: Pt presents with flat affect, engaged and participatory throughout session. Pt shares that talking to and helping other people helps manage his stress. He shares he has physical compromises, so he needs to find coping skills that can meet his needs without too much physical exertion. Pt shares he would like to implement using a stress ball or other external means to manage stress.  P: OT group will be x1 per week while pt inpatient.  Dalphine Handing, MSOT, OTR/L Behavioral Health OT/ Acute Relief OT PHP Office: [phone]  Dalphine Handing 04/23/2019, 11:34 AM

## 2019-04-23 NOTE — Plan of Care (Signed)
Post 1:1 note 2/4  D: pt has been resting since lunch. Pt doesn't seem in distress with non labored breathing and symmetrical chest expansion. Pt safe on the unit. Q35m safety checks implemented and continued. Will continue to monitor.   Pt progressing in the following metrics  Problem: Education: Goal: Ability to make informed decisions regarding treatment will improve Outcome: Progressing   Problem: Coping: Goal: Coping ability will improve Outcome: Progressing   Problem: Health Behavior/Discharge Planning: Goal: Identification of resources available to assist in meeting health care needs will improve Outcome: Progressing   Problem: Medication: Goal: Compliance with prescribed medication regimen will improve Outcome: Progressing

## 2019-04-23 NOTE — Progress Notes (Signed)
Post 1:1 note  Pt has been viewed and assessed. Pt is seen to be stable and compliant with his walker. Pt denies any problems ambulating. Pt is comfortable ending his 1:1 on his new medication. Pt safe on the unit. q20m safety checks implemented and continued. Will continue to monitor.

## 2019-04-23 NOTE — Progress Notes (Signed)
Post 1:1 note 4/4  Pt has been viewed in the milieu and interacting in recreation. Pt is complaining of sedation. Pt spoke with the physician about medication. Pt denies any other physical symptoms. Pt safe on the unit. q82m safety checks implemented and continued. Will continue to monitor.

## 2019-04-23 NOTE — Progress Notes (Addendum)
Recreation Therapy Notes  Date:  4.29.20 Time: 0930 Location: 300 Hall Dayroom  Group Topic: Stress Management  Goal Area(s) Addresses:  Patient will identify positive stress management techniques. Patient will identify benefits of using stress management post d/c.  Behavioral Response:  Engaged  Intervention:  Stress Management  Activity :  Guided Imagery.  LRT read a script that took patients for a walk on the beach to hear the "peaceful waves".  Patients were to listen to the script as it was read to engage in the activity.  Education:  Stress Management, Discharge Planning.   Education Outcome: Acknowledges Education  Clinical Observations/Feedback:  Pt attended and participated in group.     Caroll Rancher, LRT/CTRS   Caroll Rancher A 04/23/2019 10:48 AM

## 2019-04-23 NOTE — Progress Notes (Signed)
1:1 Note: Patient maintained on constant supervision for safety due to high fall risk.  Patient ambulating on the unit with a rolling walker.  Denies suicidal thoughts, auditory and visual hallucinations.  Medication given as prescribed.  Routine safety checks maintained every 15 minutes.  Patient is intrusive and interfering with other patient treatment plan.  Patient visible in milieu interacting with peers.  Patient is safe on the unit with supervision.

## 2019-04-23 NOTE — Progress Notes (Signed)
1-1 note:  Taylor Olson is in room and resting comfortably at this time. Respirations even and unlabored. No acute distress noted at this time. A. 1-1 maintained at this time. R. Safety maintained, will continue to monitor.

## 2019-04-23 NOTE — Progress Notes (Signed)
1:1 note  Pt found in bed; allowed to rest. Upon awakening pt was bright and animated. Pt denies any physical symptoms or pain. Pt safe on the unit. q51m safety checks implemented and continued. 1:1 continues for safety. Will continue to monitor. Sitter within arms reach.

## 2019-04-23 NOTE — Progress Notes (Signed)
1:1 note  Pt safe on the unit. Pt has been resting and attending morning groups. Pt denies any physical symptoms. Pt progressing with compliance. q55m safety checks implemented and continued. 1:1 within arms reach. Will continue to monitor. 1:1 continues for safety.

## 2019-04-23 NOTE — Progress Notes (Signed)
Post 1:1 note 3/4  Pt was resting til recreation time. Pt now leaving for rec. Pt denies any complaints or pain. Pt safe on the unit. q42m safety checks implemented and continued. Pt complaint with walker. Will continue to monitor.

## 2019-04-23 NOTE — Progress Notes (Addendum)
[facility] MD Progress Note  04/23/2019 10:20 AM Javoni Lucken  MRN:  [id number] Subjective: Patient reports feeling better than he did on admission.  He met with PT consultant yesterday.  Describes improving gait although still using a walker for assistance.  He reports improving mood and also slept better last night.  He feels that current medication combination is well-tolerated and he states he slept better on  Remeron which was added yesterday evening. Currently presents future oriented.  He plans to go live with a family member.  He does state that his family is currently out of town and will not be available until Friday morning.  Objective: I have discussed case with treatment team and have met with patient.  58 year old male, history of alcohol dependence.  Admitted on 4/19 following a suicide attempt by overdose on baclofen which required ICU treatment initially. At this time patient is improving.  He describes improving mood and does present with a more reactive/less anxious affect.  He is denying any suicidal ideations and currently presents future oriented.  He plans to follow-up at the [facility] for further outpatient psychiatric and medical treatment following discharge.  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 No disruptive or agitated behaviors on unit. Patient was seen by physical therapist, with the recommendation of continue to use roller walker and continue outpatient PT. Staff reports patient has been noted to be more animated with a full range of affect.  He has been visible on unit/milieu.  He presents pleasant on approach.  He does remain vaguely anxious and today ruminates about his family not being in town until later this week.  Gait seems improved, he is walking with walker assistance.  Staff notes indicate that he sometimes needs to be reminded to use his walker.  Denies  medication side effects at this time.  Denies suicidal ideation.    Principal Problem: MDD (major depressive disorder), recurrent severe, without psychosis ([place]) Diagnosis: Principal Problem:   MDD (major depressive disorder), recurrent severe, without psychosis ([place])  Total Time spent with patient: 20 minutes  Past Psychiatric History: See admission H&P  Past Medical History:  Past Medical History:  Diagnosis Date  . COPD (chronic obstructive pulmonary disease) ([place])   . GERD (gastroesophageal reflux disease)   . History of blood clots     Past Surgical History:  Procedure Laterality Date  . CARDIAC CATHETERIZATION     Family History: History reviewed. No pertinent family history. Family Psychiatric  History: See admission H&P Social History:  Social History   Substance and Sexual Activity  Alcohol Use Not Currently  . Frequency: Never     Social History   Substance and Sexual Activity  Drug Use Never    Social History   Socioeconomic History  . Marital status: Married    Spouse name: Not on file  . Number of children: 2  . Years of education: Not on file  . Highest education level: Not on file  Occupational History  . Not on file  Social Needs  . Financial resource strain: Patient refused  . Food insecurity:    Worry: Patient refused    Inability: Patient refused  . Transportation needs:    Medical: Patient refused    Non-medical: Patient refused  Tobacco Use  . Smoking status: Former Research scientist (life sciences)  . Smokeless tobacco: Never Used  Substance and Sexual Activity  .  Alcohol use: Not Currently    Frequency: Never  . Drug use: Never  . Sexual activity: Not Currently  Lifestyle  . Physical activity:    Days per week: Patient refused    Minutes per session: Patient refused  . Stress: Not on file  Relationships  . Social connections:    Talks on phone: Patient refused    Gets together: Patient refused    Attends religious service: Patient refused    Active  member of club or organization: Patient refused    Attends meetings of clubs or organizations: Patient refused    Relationship status: Patient refused  Other Topics Concern  . Not on file  Social History Narrative  . Not on file   Additional Social History:    History of alcohol / drug use?: Yes(Past hx of alcohol use)  Sleep: Improving  Appetite:  improving  Current Medications: Current Facility-Administered Medications  Medication Dose Route Frequency Provider Last Rate Last Dose  . albuterol (VENTOLIN HFA) 108 (90 Base) MCG/ACT inhaler 2 puff  2 puff Inhalation Q4H PRN Lindon Romp A, NP   2 puff at 04/22/19 0751  . alum & mag hydroxide-simeth (MAALOX/MYLANTA) 200-200-20 MG/5ML suspension 30 mL  30 mL Oral Q4H PRN Ethelene Hal, NP      . dicyclomine (BENTYL) tablet 20 mg  20 mg Oral Q6H PRN Lindon Romp A, NP   20 mg at 04/21/19 2248  . famotidine (PEPCID) tablet 20 mg  20 mg Oral BID Sharma Covert, MD   20 mg at 04/21/19 1704  . gabapentin (NEURONTIN) capsule 200 mg  200 mg Oral TID Sharma Covert, MD   200 mg at 04/23/19 0936  . hydrOXYzine (ATARAX/VISTARIL) tablet 25 mg  25 mg Oral TID PRN Ethelene Hal, NP   25 mg at 04/21/19 2248  . lactulose (CHRONULAC) 10 GM/15ML solution 20 g  20 g Oral TID Lindon Romp A, NP   20 g at 04/22/19 1715  . lidocaine (LIDODERM) 5 % 1 patch  1 patch Transdermal Daily Derrill Center, NP   1 patch at 04/22/19 0751  . magnesium hydroxide (MILK OF MAGNESIA) suspension 30 mL  30 mL Oral Daily PRN Ethelene Hal, NP      . menthol-cetylpyridinium (CEPACOL) lozenge 3 mg  1 lozenge Oral PRN Lindon Romp A, NP      . methocarbamol (ROBAXIN) tablet 500 mg  500 mg Oral Q8H PRN Lindon Romp A, NP   500 mg at 04/20/19 2137  . mirtazapine (REMERON) tablet 7.5 mg  7.5 mg Oral QHS Connye Burkitt, NP   7.5 mg at 04/22/19 2230  . multivitamin with minerals tablet 1 tablet  1 tablet Oral Daily Lindon Romp A, NP   1 tablet at  04/23/19 0936  . mupirocin ointment (BACTROBAN) 2 % 1 application  1 application Nasal BID Lindon Romp A, NP   1 application at 81/10/31 0940  . naproxen (NAPROSYN) tablet 500 mg  500 mg Oral BID PRN Lindon Romp A, NP   500 mg at 04/21/19 0847  . phenol (CHLORASEPTIC) mouth spray 1 spray  1 spray Mouth/Throat PRN Lindon Romp A, NP   1 spray at 04/22/19 1211  . sertraline (ZOLOFT) tablet 50 mg  50 mg Oral Daily Derrill Center, NP   50 mg at 04/23/19 0937  . thiamine (B-1) injection 100 mg  100 mg Intramuscular Once Lindon Romp A, NP      . thiamine (  VITAMIN B-1) tablet 100 mg  100 mg Oral Daily Lindon Romp A, NP   100 mg at 04/23/19 3244    Lab Results:  Results for orders placed or performed during the hospital encounter of 04/18/19 (from the past 48 hour(s))  CBC with Differential/Platelet     Status: Abnormal   Collection Time: 04/21/19  6:37 PM  Result Value Ref Range   WBC 3.1 (L) 4.0 - 10.5 K/uL   RBC 2.73 (L) 4.22 - 5.81 MIL/uL   Hemoglobin 9.0 (L) 13.0 - 17.0 g/dL   HCT 27.9 (L) 39.0 - 52.0 %   MCV 102.2 (H) 80.0 - 100.0 fL   MCH 33.0 26.0 - 34.0 pg   MCHC 32.3 30.0 - 36.0 g/dL   RDW 15.0 11.5 - 15.5 %   Platelets 125 (L) 150 - 400 K/uL    Comment: REPEATED TO VERIFY Immature Platelet Fraction may be clinically indicated, consider ordering this additional test [id number]    nRBC 0.0 0.0 - 0.2 %   Neutrophils Relative % 56 %   Neutro Abs 1.7 1.7 - 7.7 K/uL   Lymphocytes Relative 29 %   Lymphs Abs 0.9 0.7 - 4.0 K/uL   Monocytes Relative 10 %   Monocytes Absolute 0.3 0.1 - 1.0 K/uL   Eosinophils Relative 5 %   Eosinophils Absolute 0.2 0.0 - 0.5 K/uL   Basophils Relative 0 %   Basophils Absolute 0.0 0.0 - 0.1 K/uL   Immature Granulocytes 0 %   Abs Immature Granulocytes 0.01 0.00 - 0.07 K/uL    Comment: Performed at [facility], [place] [address] [place], [place] [address]  Ammonia     Status: Abnormal   Collection Time: 04/21/19  6:37 PM  Result  Value Ref Range   Ammonia 59 (H) 9 - 35 umol/L    Comment: Performed at [facility], [place] [address], [place], [place] [address]  Comprehensive metabolic panel     Status: Abnormal   Collection Time: 04/21/19  6:37 PM  Result Value Ref Range   Sodium 138 135 - 145 mmol/L   Potassium 4.2 3.5 - 5.1 mmol/L   Chloride 111 98 - 111 mmol/L   CO2 21 (L) 22 - 32 mmol/L   Glucose, Bld 114 (H) 70 - 99 mg/dL   BUN 17 6 - 20 mg/dL   Creatinine, Ser 0.86 0.61 - 1.24 mg/dL   Calcium 8.3 (L) 8.9 - 10.3 mg/dL   Total Protein 7.1 6.5 - 8.1 g/dL   Albumin 3.0 (L) 3.5 - 5.0 g/dL   AST 51 (H) 15 - 41 U/L   ALT 32 0 - 44 U/L   Alkaline Phosphatase 146 (H) 38 - 126 U/L   Total Bilirubin 0.9 0.3 - 1.2 mg/dL   GFR calc non Af Amer >60 >60 mL/min   GFR calc Af Amer >60 >60 mL/min   Anion gap 6 5 - 15    Comment: Performed at [facility], [place] [address] [place], [place] [address]    Blood Alcohol level:  No results found for: [facility]  Metabolic Disorder Labs: No results found for: HGBA1C, MPG No results found for: PROLACTIN Lab Results  Component Value Date   TRIG 88 04/13/2019    Physical Findings: AIMS: Facial and Oral Movements Muscles of Facial Expression: None, normal Lips and Perioral Area: None, normal Jaw: None, normal Tongue: None, normal,Extremity Movements Upper (arms, wrists, hands, fingers): None, normal Lower (legs, knees, ankles, toes):  None, normal, Trunk Movements Neck, shoulders, hips: None, normal, Overall Severity Severity of abnormal movements (highest score from questions above): None, normal Incapacitation due to abnormal movements: None, normal Patient's awareness of abnormal movements (rate only patient's report): No Awareness, Dental Status Current problems with teeth and/or dentures?: No Does patient usually wear dentures?: No  CIWA:  CIWA-Ar Total: 0 COWS:  COWS Total Score: 0  Musculoskeletal: Strength & Muscle Tone:  decreased-no tremors, diaphoresis Gait & Station: unsteady Patient leans: N/A  Psychiatric Specialty Exam: Physical Exam  Nursing note and vitals reviewed. Constitutional: He is oriented to person, place, and time. He appears well-developed and well-nourished.  Cardiovascular: Normal rate.  Respiratory: Effort normal.  Neurological: He is alert and oriented to person, place, and time.    Review of Systems  Constitutional: Negative.   Psychiatric/Behavioral: Positive for depression and substance abuse (hx ETOH). Negative for hallucinations and suicidal ideas. The patient has insomnia. The patient is not nervous/anxious.   Denies chest pain, no shortness of breath, no coughing, no fever, no chills  Blood pressure 131/90, pulse 77, temperature 97.9 F (36.6 C), temperature source Oral, resp. rate 20, height _0  (1.753 m), weight 87.3 kg, SpO2 100 %.Body mass index is 28.41 kg/m.  General Appearance: Better grooming  Eye Contact:  Good  Speech:  Normal Rate  Volume:  Normal  Mood:  Gradually/partially improved mood, acknowledges he feels significantly better than on admission  Affect:  Becoming more reactive, vaguely anxious  Thought Process:  Linear and Descriptions of Associations: Intact  Orientation:  Full (Time, Place, and Person)  Thought Content:  No hallucinations, no delusions, does not appear internally preoccupied  Suicidal Thoughts:  No-denies suicidal or self-injurious ideations, denies homicidal or violent ideations, contracts for safety on unit  Homicidal Thoughts:  No  Memory:  Recent and remote grossly intact  Judgement:  Other:  fair/improving  Insight:  Fair and improving  Psychomotor Activity:  Normal  Concentration:  Concentration: improving  and Attention Span: improving   Recall:  Good  Fund of Knowledge:  Good  Language:  Good  Akathisia:  No  Handed:  Right  AIMS (if indicated):     Assets:  Communication Skills Desire for  Improvement Housing Resilience Social Support  ADL's:  Intact  Cognition:  WNL  Sleep:  Number of Hours: 5.25   Assessment: 58 year old male, history of alcohol dependence.  Admitted on 4/19 following a suicide attempt by overdose on baclofen which required ICU treatment initially.  At this time patient presents with noticeable improvement.  He describes improving mood.  His affect remains vaguely anxious but is less constricted and fuller in range.  He appears more conversant/communicative and is currently future oriented.  Denies suicidal ideations.  He is currently tolerating Zoloft/Remeron combination well. His ambulation is improving and currently ambulates with walker- was seen by PT yesterday.  Currently he is future oriented and focusing more on discharge.  He does worry/ruminate regarding his family with whom he lives not being in town until later this week.  Plan to follow-up at the [facility] following his discharge.  Treatment Plan Summary: Daily contact with patient to assess and evaluate symptoms and progress in treatment and Medication management  Treatment plan reviewed as below today 4/29 Encourage group and milieu participation Encourage efforts to work on sobriety/abstinence Continue all Remeron 7.5 mg PO QHS for sleep/mood Continue Zoloft 50 mg PO daily for mood Continue Bentyl 20 mg PO Q6HR PRN cramping Continue Pepcid 20 mg  PO BID for gastric protection Continue Neurontin 200 mg PO TID for chronic pain Continue lidocaine 5% patch trandermal over 12 hours daily for pain Continue Vistaril 25 mg PO TID PRN anxiety Continue Lactulose 20 g PO TID for high ammonia Continue Robaxin 500 mg PO Q8HR PRN pain We will recheck CBC and ammonia serum level in a.m. (patient is on lactulose) Treatment team working on disposition planning.   Jenne Campus, MD 04/23/2019, 10:20 AM   Patient ID: Tedd Sias, male   DOB: 22-Aug-1961, 58 y.o.   MRN: [id number]

## 2019-04-23 NOTE — Progress Notes (Signed)
[facility] Group Notes:  (Nursing/MHT/Case Management/Adjunct)  Date:  04/23/2019  Time:  2030  Type of Therapy:  wrap up group  Participation Level:  Active  Participation Quality:  Appropriate, Attentive, Sharing and Supportive  Affect:  Appropriate and Depressed  Cognitive:  Appropriate  Insight:  Improving  Engagement in Group:  Engaged  Modes of Intervention:  Clarification, Education and Support  Summary of Progress/Problems: Pt shared that he finally got sleep and slept all night and all day to catch up. Pt attributes getting Rimeron to why he was able to sleep better.  Pt would like to be able to contact his wife to let her know he is here. Pt was staying with brother and sister in law but reports they are no longer at the residence. Pt lives apart from his wife because he does not get along with his mother in law. Pt reports waiting for disability in order for him and his wife to get own place. 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   Marcille Buffy 04/23/2019, 11:09 PM

## 2019-04-24 LAB — CBC WITH DIFFERENTIAL/PLATELET
Abs Immature Granulocytes: 0.04 10*3/uL (ref 0.00–0.07)
Basophils Absolute: 0 10*3/uL (ref 0.0–0.1)
Basophils Relative: 0 %
Eosinophils Absolute: 0.1 10*3/uL (ref 0.0–0.5)
Eosinophils Relative: 3 %
HCT: 31.8 % — ABNORMAL LOW (ref 39.0–52.0)
Hemoglobin: 10.1 g/dL — ABNORMAL LOW (ref 13.0–17.0)
Immature Granulocytes: 1 %
Lymphocytes Relative: 27 %
Lymphs Abs: 1.1 10*3/uL (ref 0.7–4.0)
MCH: 32.8 pg (ref 26.0–34.0)
MCHC: 31.8 g/dL (ref 30.0–36.0)
MCV: 103.2 fL — ABNORMAL HIGH (ref 80.0–100.0)
Monocytes Absolute: 0.4 10*3/uL (ref 0.1–1.0)
Monocytes Relative: 11 %
Neutro Abs: 2.4 10*3/uL (ref 1.7–7.7)
Neutrophils Relative %: 58 %
Platelets: 126 10*3/uL — ABNORMAL LOW (ref 150–400)
RBC: 3.08 MIL/uL — ABNORMAL LOW (ref 4.22–5.81)
RDW: 14.8 % (ref 11.5–15.5)
WBC: 4.1 10*3/uL (ref 4.0–10.5)
nRBC: 0 % (ref 0.0–0.2)

## 2019-04-24 LAB — AMMONIA: Ammonia: 43 umol/L — ABNORMAL HIGH (ref 9–35)

## 2019-04-24 NOTE — Plan of Care (Addendum)
Patient was silly and pleasant upon initial approach this morning. Denies SI HI AVH. Patient claims he is not ready to leave quite yet, and would like another day of hospitalization. Patient is steady in gait and is tolerating walking without a walker well.  Patient is compliant with medications prescribed. No side effects noted. Safety is maintained with 15 minute checks as well as environmental checks. Will continue to monitor.  Problem: Education: Goal: Emotional status will improve Outcome: Progressing Goal: Mental status will improve Outcome: Progressing Goal: Verbalization of understanding the information provided will improve Outcome: Progressing   Problem: Activity: Goal: Interest or engagement in activities will improve Outcome: Progressing

## 2019-04-24 NOTE — Progress Notes (Signed)
The focus of this group is to help patients establish daily goals to achieve during treatment and discuss how the patient can incorporate goal setting into their daily lives to aide in recovery.  Pt stated that he would be going home today. He says that he would be seeking a referral from Physical Therapy to visit his home when he works with them today.

## 2019-04-24 NOTE — Progress Notes (Signed)
[facility] MD Progress Note  04/24/2019 5:03 PM Taylor Olson  MRN:  [id number] Subjective: Describes partial/gradual improvement. Currently denies suicidal ideations, and presents future oriented . Currently focusing more on disposition planning options, and worries about discharge date and transportation home. States family member has told him he will pick up at hospital but that this person is currently out of town . " I am hoping he gets back tomorrow but I cannot control that ".  Denies medication side effects.   Objective: I have discussed case with treatment team and have met with patient.  58 year old male, history of alcohol dependence.  Admitted on 4/19 following a suicide attempt by overdose on baclofen which required ICU treatment initially. Patient reports improvement compared to admission, and describes improved mood/denies suicidal ideations . Today future oriented, although presents anxious/ruminative about disposition planning and how he will return home after discharge. Responds partially to reassurance and I have reassured him that CSW will address transportation concerns with him. Gait improving and ambulating more steadily with rolling walker. States he is feeling " stronger". Denies medication side effects. Denies SI. Visible in day room, no disruptive or agitated behaviors . Has been going to some groups. Labs reviewed - ammonia level improved from 59 to 43. CBC: WBC improved to 4.1, Hgb up to 10.1, PLT count 126.     Principal Problem: MDD (major depressive disorder), recurrent severe, without psychosis ([place]) Diagnosis: Principal Problem:   MDD (major depressive disorder), recurrent severe, without psychosis ([place])  Total Time spent with patient: 20 minutes  Past Psychiatric History: See admission H&P  Past Medical History:  Past Medical History:  Diagnosis Date  . COPD (chronic obstructive pulmonary disease) ([place])   . GERD (gastroesophageal reflux disease)   . History of  blood clots     Past Surgical History:  Procedure Laterality Date  . CARDIAC CATHETERIZATION     Family History: History reviewed. No pertinent family history. Family Psychiatric  History: See admission H&P Social History:  Social History   Substance and Sexual Activity  Alcohol Use Not Currently  . Frequency: Never     Social History   Substance and Sexual Activity  Drug Use Never    Social History   Socioeconomic History  . Marital status: Married    Spouse name: Not on file  . Number of children: 2  . Years of education: Not on file  . Highest education level: Not on file  Occupational History  . Not on file  Social Needs  . Financial resource strain: Patient refused  . Food insecurity:    Worry: Patient refused    Inability: Patient refused  . Transportation needs:    Medical: Patient refused    Non-medical: Patient refused  Tobacco Use  . Smoking status: Former Research scientist (life sciences)  . Smokeless tobacco: Never Used  Substance and Sexual Activity  . Alcohol use: Not Currently    Frequency: Never  . Drug use: Never  . Sexual activity: Not Currently  Lifestyle  . Physical activity:    Days per week: Patient refused    Minutes per session: Patient refused  . Stress: Not on file  Relationships  . Social connections:    Talks on phone: Patient refused    Gets together: Patient refused    Attends religious service: Patient refused    Active member of club or organization: Patient refused    Attends meetings of clubs or organizations: Patient refused    Relationship status: Patient refused  Other  Topics Concern  . Not on file  Social History Narrative  . Not on file   Additional Social History:    History of alcohol / drug use?: Yes(Past hx of alcohol use)  Sleep: Improving  Appetite:  improving  Current Medications: Current Facility-Administered Medications  Medication Dose Route Frequency Provider Last Rate Last Dose  . albuterol (VENTOLIN HFA) 108 (90 Base)  MCG/ACT inhaler 2 puff  2 puff Inhalation Q4H PRN Lindon Romp A, NP   2 puff at 04/22/19 0751  . alum & mag hydroxide-simeth (MAALOX/MYLANTA) 200-200-20 MG/5ML suspension 30 mL  30 mL Oral Q4H PRN Ethelene Hal, NP      . famotidine (PEPCID) tablet 20 mg  20 mg Oral BID Sharma Covert, MD   20 mg at 04/24/19 0758  . gabapentin (NEURONTIN) capsule 200 mg  200 mg Oral TID Sharma Covert, MD   200 mg at 04/24/19 1240  . hydrOXYzine (ATARAX/VISTARIL) tablet 25 mg  25 mg Oral TID PRN Ethelene Hal, NP   25 mg at 04/21/19 2248  . lactulose (CHRONULAC) 10 GM/15ML solution 20 g  20 g Oral TID Lindon Romp A, NP   20 g at 04/24/19 1131  . lidocaine (LIDODERM) 5 % 1 patch  1 patch Transdermal Daily Derrill Center, NP   1 patch at 04/24/19 0758  . magnesium hydroxide (MILK OF MAGNESIA) suspension 30 mL  30 mL Oral Daily PRN Ethelene Hal, NP      . menthol-cetylpyridinium (CEPACOL) lozenge 3 mg  1 lozenge Oral PRN Lindon Romp A, NP      . mirtazapine (REMERON) tablet 7.5 mg  7.5 mg Oral QHS Connye Burkitt, NP   7.5 mg at 04/23/19 2113  . multivitamin with minerals tablet 1 tablet  1 tablet Oral Daily Lindon Romp A, NP   1 tablet at 04/24/19 0759  . mupirocin ointment (BACTROBAN) 2 % 1 application  1 application Nasal BID Lindon Romp A, NP   1 application at 97/35/32 0940  . phenol (CHLORASEPTIC) mouth spray 1 spray  1 spray Mouth/Throat PRN Lindon Romp A, NP   1 spray at 04/22/19 1211  . sertraline (ZOLOFT) tablet 50 mg  50 mg Oral Daily Derrill Center, NP   50 mg at 04/24/19 0759  . thiamine (B-1) injection 100 mg  100 mg Intramuscular Once Lindon Romp A, NP      . thiamine (VITAMIN B-1) tablet 100 mg  100 mg Oral Daily Lindon Romp A, NP   100 mg at 04/24/19 9924    Lab Results:  Results for orders placed or performed during the hospital encounter of 04/18/19 (from the past 48 hour(s))  Ammonia     Status: Abnormal   Collection Time: 04/24/19  7:07 AM  Result  Value Ref Range   Ammonia 43 (H) 9 - 35 umol/L    Comment: Performed at [facility], [place] [address] [place], [place] [address]  CBC with Differential/Platelet     Status: Abnormal   Collection Time: 04/24/19  7:07 AM  Result Value Ref Range   WBC 4.1 4.0 - 10.5 K/uL   RBC 3.08 (L) 4.22 - 5.81 MIL/uL   Hemoglobin 10.1 (L) 13.0 - 17.0 g/dL   HCT 31.8 (L) 39.0 - 52.0 %   MCV 103.2 (H) 80.0 - 100.0 fL   MCH 32.8 26.0 - 34.0 pg   MCHC 31.8 30.0 - 36.0 g/dL   RDW 14.8 11.5 -  15.5 %   Platelets 126 (L) 150 - 400 K/uL    Comment: Immature Platelet Fraction may be clinically indicated, consider ordering this additional test [id number]    nRBC 0.0 0.0 - 0.2 %   Neutrophils Relative % 58 %   Neutro Abs 2.4 1.7 - 7.7 K/uL   Lymphocytes Relative 27 %   Lymphs Abs 1.1 0.7 - 4.0 K/uL   Monocytes Relative 11 %   Monocytes Absolute 0.4 0.1 - 1.0 K/uL   Eosinophils Relative 3 %   Eosinophils Absolute 0.1 0.0 - 0.5 K/uL   Basophils Relative 0 %   Basophils Absolute 0.0 0.0 - 0.1 K/uL   Immature Granulocytes 1 %   Abs Immature Granulocytes 0.04 0.00 - 0.07 K/uL    Comment: Performed at [facility], [place] [address] [place], [place] [address]    Blood Alcohol level:  No results found for: [facility]  Metabolic Disorder Labs: No results found for: HGBA1C, MPG No results found for: PROLACTIN Lab Results  Component Value Date   TRIG 88 04/13/2019    Physical Findings: AIMS: Facial and Oral Movements Muscles of Facial Expression: None, normal Lips and Perioral Area: None, normal Jaw: None, normal Tongue: None, normal,Extremity Movements Upper (arms, wrists, hands, fingers): None, normal Lower (legs, knees, ankles, toes): None, normal, Trunk Movements Neck, shoulders, hips: None, normal, Overall Severity Severity of abnormal movements (highest score from questions above): None, normal Incapacitation due to abnormal movements: None, normal Patient's  awareness of abnormal movements (rate only patient's report): No Awareness, Dental Status Current problems with teeth and/or dentures?: No Does patient usually wear dentures?: No  CIWA:  CIWA-Ar Total: 0 COWS:  COWS Total Score: 0  Musculoskeletal: Strength & Muscle Tone: decreased-no tremors, diaphoresis Gait & Station: unsteady Patient leans: N/A  Psychiatric Specialty Exam: Physical Exam  Nursing note and vitals reviewed. Constitutional: He is oriented to person, place, and time. He appears well-developed and well-nourished.  Cardiovascular: Normal rate.  Respiratory: Effort normal.  Neurological: He is alert and oriented to person, place, and time.    Review of Systems  Constitutional: Negative.   Psychiatric/Behavioral: Positive for depression and substance abuse (hx ETOH). Negative for hallucinations and suicidal ideas. The patient has insomnia. The patient is not nervous/anxious.   Denies chest pain, no shortness of breath, no coughing, no fever, no chills  Blood pressure (!) 148/93, pulse 74, temperature 97.7 F (36.5 C), temperature source Oral, resp. rate 20, height _0  (1.753 m), weight 87.3 kg, SpO2 100 %.Body mass index is 28.41 kg/m.  General Appearance: improving grooming   Eye Contact:  Good  Speech:  Normal Rate  Volume:  Normal  Mood:  partially improved mood, reports he is feeling better, remains anxious  Affect:  reactive, anxious  Thought Process:  Linear and Descriptions of Associations: Intact  Orientation:  Full (Time, Place, and Person)  Thought Content:  No hallucinations, no delusions, does not appear internally preoccupied  Suicidal Thoughts:  No-denies suicidal or self-injurious ideations, denies homicidal or violent ideations, contracts for safety on unit  Homicidal Thoughts:  No  Memory:  Recent and remote grossly intact  Judgement:  Other:  fair/improving  Insight:  Fair and improving  Psychomotor Activity:  Normal  Concentration:   Concentration: improving  and Attention Span: improving   Recall:  Good  Fund of Knowledge:  Good  Language:  Good  Akathisia:  No  Handed:  Right  AIMS (if indicated):     Assets:  Communication Skills Desire for Improvement Housing Resilience Social Support  ADL's:  Intact  Cognition:  WNL  Sleep:  Number of Hours: 6.25   Assessment: 58 year old male, history of alcohol dependence.  Admitted on 4/19 following a suicide attempt by overdose on baclofen which required ICU treatment initially.  Patient presents with gradually improving mood . Today appears more  anxious, particularly about discharge planning and transportation options to return home. Denies SI. Tolerating medications well, and states Zoloft/Remeron well tolerated thus far and that he is not feeling as sedated today. Presents fully alert and attentive .   Treatment Plan Summary: Daily contact with patient to assess and evaluate symptoms and progress in treatment and Medication management  Treatment plan reviewed as below today 4/30 Encourage group and milieu participation Encourage efforts to work on sobriety/abstinence Continue Remeron 7.5 mg PO QHS for sleep/mood Continue Zoloft 50 mg PO daily for mood Continue Bentyl 20 mg PO Q6HR PRN cramping Continue Pepcid 20 mg PO BID for gastric protection Continue Neurontin 200 mg PO TID for chronic pain Continue lidocaine 5% patch trandermal over 12 hours daily for pain Continue Vistaril 25 mg PO TID PRN anxiety Continue Lactulose 20 g PO TID for high ammonia Continue Robaxin 500 mg PO Q8HR PRN pain Treatment team working on disposition planning.   Jenne Campus, MD 04/24/2019, 5:03 PM   Patient ID: Tedd Sias, male   DOB: 1961/12/09, 58 y.o.   MRN: [id number]

## 2019-04-24 NOTE — Progress Notes (Signed)
[place] NOVEL CORONAVIRUS (COVID-19) DAILY CHECK-OFF SYMPTOMS - answer yes or no to each - every day NO YES  Have you had a fever in the past 24 hours?  . Fever (Temp > 37.80C / 100F) X   Have you had any of these symptoms in the past 24 hours? . New Cough .  Sore Throat  .  Shortness of Breath .  Difficulty Breathing .  Unexplained Body Aches   X   Have you had any one of these symptoms in the past 24 hours not related to allergies?   . Runny Nose .  Nasal Congestion .  Sneezing   X   If you have had runny nose, nasal congestion, sneezing in the past 24 hours, has it worsened?  X   EXPOSURES - check yes or no X   Have you traveled outside the state in the past 14 days?  X   Have you been in contact with someone with a confirmed diagnosis of COVID-19 or PUI in the past 14 days without wearing appropriate PPE?  X   Have you been living in the same home as a person with confirmed diagnosis of COVID-19 or a PUI (household contact)?    X   Have you been diagnosed with COVID-19?    X              What to do next: Answered NO to all: Answered YES to anything:   Proceed with unit schedule Follow the [facility] Inpatient Flowsheet.   

## 2019-04-25 NOTE — Progress Notes (Signed)
   04/25/19 0331  COVID-19 Daily Checkoff  Have you had a fever (temp > 37.80C/100F)  in the past 24 hours?  No  COVID-19 EXPOSURE  Have you traveled outside the state in the past 14 days? No  Have you been in contact with someone with a confirmed diagnosis of COVID-19 or PUI in the past 14 days without wearing appropriate PPE? No  Have you been living in the same home as a person with confirmed diagnosis of COVID-19 or a PUI (household contact)? No  Have you been diagnosed with COVID-19? No   

## 2019-04-25 NOTE — Progress Notes (Signed)
Adult Psychoeducational Group Note  Date:  04/25/2019 Time:  9:37 PM  Group Topic/Focus:  Wrap-Up Group:   The focus of this group is to help patients review their daily goal of treatment and discuss progress on daily workbooks.  Participation Level:  Active  Participation Quality:  Appropriate  Affect:  Appropriate  Cognitive:  Alert  Insight: Appropriate  Engagement in Group:  Engaged  Modes of Intervention:  Discussion  Additional Comments:  Pt stated that today has been good except he did not meet with PT today. 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   Flonnie Hailstone 04/25/2019, 9:37 PM

## 2019-04-25 NOTE — Progress Notes (Signed)
[facility] MD Progress Note  04/25/2019 11:58 AM Taylor Olson  MRN:  [id number] Subjective: Patient endorses improvement, but reports some lingering anxiety. Remains focused on discharge planning concerns and in particular regarding how he will return home at discharge. Denies medication side effects.   Objective: I have discussed case with treatment team and have met with patient.  58 year old male, history of alcohol dependence.  Admitted on 4/19 following a suicide attempt by overdose on baclofen which required ICU treatment initially.  Currently patient presents alert, attentive, with improving mood, lingering anxiety. Seen with CSW today. Explains he lives with family in [place], [place] and that " they cannot come pick me up until tomorrow or Sunday, and I have no way to get home or to get in the house without them". Discussed his concerns with CSW.  No disruptive or agitated behaviors on unit, visible in day room. Currently does not endorse medication side effects. Denies SI.    Principal Problem: MDD (major depressive disorder), recurrent severe, without psychosis ([place]) Diagnosis: Principal Problem:   MDD (major depressive disorder), recurrent severe, without psychosis ([place])  Total Time spent with patient: 15 minutes  Past Psychiatric History: See admission H&P  Past Medical History:  Past Medical History:  Diagnosis Date  . COPD (chronic obstructive pulmonary disease) ([place])   . GERD (gastroesophageal reflux disease)   . History of blood clots     Past Surgical History:  Procedure Laterality Date  . CARDIAC CATHETERIZATION     Family History: History reviewed. No pertinent family history. Family Psychiatric  History: See admission H&P Social History:  Social History   Substance and Sexual Activity  Alcohol Use Not Currently  . Frequency: Never     Social History   Substance and Sexual Activity  Drug Use Never    Social History   Socioeconomic History  . Marital status:  Married    Spouse name: Not on file  . Number of children: 2  . Years of education: Not on file  . Highest education level: Not on file  Occupational History  . Not on file  Social Needs  . Financial resource strain: Patient refused  . Food insecurity:    Worry: Patient refused    Inability: Patient refused  . Transportation needs:    Medical: Patient refused    Non-medical: Patient refused  Tobacco Use  . Smoking status: Former Research scientist (life sciences)  . Smokeless tobacco: Never Used  Substance and Sexual Activity  . Alcohol use: Not Currently    Frequency: Never  . Drug use: Never  . Sexual activity: Not Currently  Lifestyle  . Physical activity:    Days per week: Patient refused    Minutes per session: Patient refused  . Stress: Not on file  Relationships  . Social connections:    Talks on phone: Patient refused    Gets together: Patient refused    Attends religious service: Patient refused    Active member of club or organization: Patient refused    Attends meetings of clubs or organizations: Patient refused    Relationship status: Patient refused  Other Topics Concern  . Not on file  Social History Narrative  . Not on file   Additional Social History:    History of alcohol / drug use?: Yes(Past hx of alcohol use)  Sleep: Improving  Appetite:  improving  Current Medications: Current Facility-Administered Medications  Medication Dose Route Frequency Provider Last Rate Last Dose  . albuterol (VENTOLIN HFA) 108 (90 Base) MCG/ACT inhaler  2 puff  2 puff Inhalation Q4H PRN Lindon Romp A, NP   2 puff at 04/22/19 0751  . alum & mag hydroxide-simeth (MAALOX/MYLANTA) 200-200-20 MG/5ML suspension 30 mL  30 mL Oral Q4H PRN Ethelene Hal, NP      . famotidine (PEPCID) tablet 20 mg  20 mg Oral BID Sharma Covert, MD   20 mg at 04/24/19 0758  . gabapentin (NEURONTIN) capsule 200 mg  200 mg Oral TID Sharma Covert, MD   200 mg at 04/25/19 0905  . hydrOXYzine  (ATARAX/VISTARIL) tablet 25 mg  25 mg Oral TID PRN Ethelene Hal, NP   25 mg at 04/24/19 2105  . lactulose (CHRONULAC) 10 GM/15ML solution 20 g  20 g Oral TID Lindon Romp A, NP   20 g at 04/24/19 1131  . lidocaine (LIDODERM) 5 % 1 patch  1 patch Transdermal Daily Derrill Center, NP   1 patch at 04/25/19 0905  . magnesium hydroxide (MILK OF MAGNESIA) suspension 30 mL  30 mL Oral Daily PRN Ethelene Hal, NP      . menthol-cetylpyridinium (CEPACOL) lozenge 3 mg  1 lozenge Oral PRN Lindon Romp A, NP      . mirtazapine (REMERON) tablet 7.5 mg  7.5 mg Oral QHS Connye Burkitt, NP   7.5 mg at 04/24/19 2105  . multivitamin with minerals tablet 1 tablet  1 tablet Oral Daily Lindon Romp A, NP   1 tablet at 04/25/19 0906  . mupirocin ointment (BACTROBAN) 2 % 1 application  1 application Nasal BID Lindon Romp A, NP   1 application at 16/10/96 0940  . phenol (CHLORASEPTIC) mouth spray 1 spray  1 spray Mouth/Throat PRN Lindon Romp A, NP   1 spray at 04/22/19 1211  . sertraline (ZOLOFT) tablet 50 mg  50 mg Oral Daily Derrill Center, NP   50 mg at 04/25/19 0907  . thiamine (B-1) injection 100 mg  100 mg Intramuscular Once Lindon Romp A, NP      . thiamine (VITAMIN B-1) tablet 100 mg  100 mg Oral Daily Lindon Romp A, NP   100 mg at 04/25/19 0454    Lab Results:  Results for orders placed or performed during the hospital encounter of 04/18/19 (from the past 48 hour(s))  Ammonia     Status: Abnormal   Collection Time: 04/24/19  7:07 AM  Result Value Ref Range   Ammonia 43 (H) 9 - 35 umol/L    Comment: Performed at [facility], [place] [address], [place], [place] [address]  CBC with Differential/Platelet     Status: Abnormal   Collection Time: 04/24/19  7:07 AM  Result Value Ref Range   WBC 4.1 4.0 - 10.5 K/uL   RBC 3.08 (L) 4.22 - 5.81 MIL/uL   Hemoglobin 10.1 (L) 13.0 - 17.0 g/dL   HCT 31.8 (L) 39.0 - 52.0 %   MCV 103.2 (H) 80.0 - 100.0 fL   MCH 32.8 26.0 - 34.0 pg    MCHC 31.8 30.0 - 36.0 g/dL   RDW 14.8 11.5 - 15.5 %   Platelets 126 (L) 150 - 400 K/uL    Comment: Immature Platelet Fraction may be clinically indicated, consider ordering this additional test [id number]    nRBC 0.0 0.0 - 0.2 %   Neutrophils Relative % 58 %   Neutro Abs 2.4 1.7 - 7.7 K/uL   Lymphocytes Relative 27 %   Lymphs Abs 1.1 0.7 - 4.0 K/uL  Monocytes Relative 11 %   Monocytes Absolute 0.4 0.1 - 1.0 K/uL   Eosinophils Relative 3 %   Eosinophils Absolute 0.1 0.0 - 0.5 K/uL   Basophils Relative 0 %   Basophils Absolute 0.0 0.0 - 0.1 K/uL   Immature Granulocytes 1 %   Abs Immature Granulocytes 0.04 0.00 - 0.07 K/uL    Comment: Performed at [facility], [place] [address], [place], [place] [address]    Blood Alcohol level:  No results found for: [facility]  Metabolic Disorder Labs: No results found for: HGBA1C, MPG No results found for: PROLACTIN Lab Results  Component Value Date   TRIG 88 04/13/2019    Physical Findings: AIMS: Facial and Oral Movements Muscles of Facial Expression: None, normal Lips and Perioral Area: None, normal Jaw: None, normal Tongue: None, normal,Extremity Movements Upper (arms, wrists, hands, fingers): None, normal Lower (legs, knees, ankles, toes): None, normal, Trunk Movements Neck, shoulders, hips: None, normal, Overall Severity Severity of abnormal movements (highest score from questions above): None, normal Incapacitation due to abnormal movements: None, normal Patient's awareness of abnormal movements (rate only patient's report): No Awareness, Dental Status Current problems with teeth and/or dentures?: No Does patient usually wear dentures?: No  CIWA:  CIWA-Ar Total: 0 COWS:  COWS Total Score: 2  Musculoskeletal: Strength & Muscle Tone: decreased-no tremors, diaphoresis Gait & Station: unsteady Patient leans: N/A  Psychiatric Specialty Exam: Physical Exam  Nursing note and vitals reviewed. Constitutional: He is  oriented to person, place, and time. He appears well-developed and well-nourished.  Cardiovascular: Normal rate.  Respiratory: Effort normal.  Neurological: He is alert and oriented to person, place, and time.    Review of Systems  Constitutional: Negative.   Psychiatric/Behavioral: Positive for depression and substance abuse (hx ETOH). Negative for hallucinations and suicidal ideas. The patient has insomnia. The patient is not nervous/anxious.   Denies chest pain, no shortness of breath, no coughing, no fever, no chills  Blood pressure 128/80, pulse 80, temperature 98.7 F (37.1 C), temperature source Oral, resp. rate 20, height _0  (1.753 m), weight 87.3 kg, SpO2 99 %.Body mass index is 28.41 kg/m.  General Appearance: improving grooming   Eye Contact:  Good  Speech:  Normal Rate  Volume:  Normal  Mood:  improving , feeling anxious  Affect:  Congruent and reactive  Thought Process:  Linear and Descriptions of Associations: Intact  Orientation:  Full (Time, Place, and Person)  Thought Content:  No hallucinations, no delusions, does not appear internally preoccupied  Suicidal Thoughts:  No-denies suicidal or self-injurious ideations, denies homicidal or violent ideations, contracts for safety on unit  Homicidal Thoughts:  No  Memory:  Recent and remote grossly intact  Judgement:  Other:  fair/improving  Insight:  Fair and improving  Psychomotor Activity:  Normal  Concentration:  Concentration: improving  and Attention Span: improving   Recall:  Good  Fund of Knowledge:  Good  Language:  Good  Akathisia:  No  Handed:  Right  AIMS (if indicated):     Assets:  Communication Skills Desire for Improvement Housing Resilience Social Support  ADL's:  Intact  Cognition:  WNL  Sleep:  Number of Hours: 6.75   Assessment: 58 year old male, history of alcohol dependence.  Admitted on 4/19 following a suicide attempt by overdose on baclofen which required ICU treatment  initially.  Patient is presenting with improving mood and range of affect . No suicidal ideations.  Remains anxious regarding discharge and explains lives with family  who are not currently available to pick him up or let him into the house . States they can pick him up over the weekend. Reports feeling motivated in abstinence, sobriety, and is interested in continuing treatment via the [place] following discharge. Currently on Remeron , Zoloft which he is tolerating well and today does not report sedation/drowsiness .   Treatment Plan Summary: Daily contact with patient to assess and evaluate symptoms and progress in treatment and Medication management  Treatment plan reviewed as below today 5/1  Encourage group and milieu participation Encourage efforts to work on sobriety/abstinence Continue Remeron 7.5 mg PO QHS for sleep/mood Continue Zoloft 50 mg PO daily for mood Continue Bentyl 20 mg PO Q6HR PRN cramping Continue Pepcid 20 mg PO BID for gastric protection Continue Neurontin 200 mg PO TID for chronic pain Continue lidocaine 5% patch trandermal over 12 hours daily for pain Continue Vistaril 25 mg PO TID PRN anxiety Continue Lactulose 20 g PO TID for high ammonia Continue Robaxin 500 mg PO Q8HR PRN pain Treatment team working on disposition planning.   Jenne Campus, MD 04/25/2019, 11:58 AM

## 2019-04-25 NOTE — Plan of Care (Signed)
  Problem: Education: Goal: Knowledge of [place] [facility] Education information/materials will improve Outcome: Progressing Goal: Emotional status will improve Outcome: Progressing Goal: Mental status will improve Outcome: Progressing   

## 2019-04-25 NOTE — Progress Notes (Signed)
Recreation Therapy Notes  Date:  5.1.20 Time: 0930 Location: 300 Hall Dayroom  Group Topic: Stress Management  Goal Area(s) Addresses:  Patient will identify positive stress management techniques. Patient will identify benefits of using stress management post d/c.  Behavioral Response:  Engaged  Intervention:  Stress Management  Activity :  Meditation.  LRT introduced the stress management technique of meditation.  Meditation focused on making the most of the and the possibilities that come with it.  Patients were to follow along as meditation played in order to engage in activity.  Education:  Stress Management, Discharge Planning.   Education Outcome: Acknowledges Education  Clinical Observations/Feedback:  Pt did attended and participated in group.    Caroll Rancher, LRT/CTRS         Lillia Abed, Cathlyn Tersigni A 04/25/2019 10:50 AM

## 2019-04-25 NOTE — Progress Notes (Signed)
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 He has been in a pleasant mood. He was compliant with his medications. He is concerned about physical therapy coming here before he discharged. Support given and safety maintained on unit with 15 min checks.

## 2019-04-25 NOTE — [facility] Group Notes (Signed)
LCSW Group Therapy Note  04/25/2019 2:53 PM  Type of Therapy/Topic: Group Therapy: Feelings about Diagnosis  Participation Level: Active  Description of Group:  This group will allow patients to explore their thoughts and feelings about diagnoses they have received. Patients will be guided to explore their level of understanding and acceptance of these diagnoses. Facilitator will encourage patients to process their thoughts and feelings about the reactions of others to their diagnosis and will guide patients in identifying ways to discuss their diagnosis with significant others in their lives. This group will be process-oriented, with patients participating in exploration of their own experiences, giving and receiving support, and processing challenge from other group members.  Therapeutic Goals: 1. Patient will demonstrate understanding of diagnosis as evidenced by identifying two or more symptoms of the disorder 2. Patient will be able to express two feelings regarding the diagnosis 3. Patient will demonstrate their ability to communicate their needs through discussion and/or role play  Summary of Patient Progress: Patient remained present throughout group and listened respectfully to others, he shared "people look at you differently" when you have a mental health or substance use diagnosis. He shared that it was not helpful to hear from supports that he would experience legal issues if he continued to use drugs/alcohol.  Therapeutic Modalities:  Cognitive Behavioral Therapy Brief Therapy Feelings Identification   Enid Cutter, MSW, [facility] Clinical Social Worker

## 2019-04-25 NOTE — Progress Notes (Addendum)
D pt is observed OOB UAL on the 300 hall today - he tolerates this well. HE is flat, depressed, but pleasant and cooperative with this Clinical research associate. He makes direct eye contact, he interacts appropriately with his peers. He is concerned about seeing PT- for ambulation consult, so that he can be discharged.     A HE completed his daily assessment and on this he wrote he denied SI today and he rated his depression, hopelessness and anxiety " 0/0/0/", respectively. Pt states he is waiting on PT consult " so I can go home". Pt ambulates with a walker at present.     RSafety is in place.

## 2019-04-25 NOTE — Progress Notes (Signed)
D: Pt was in the dayroom upon initial approach.  Pt presents with anxious affect and mood.  He describes his day as "pretty good."  Pt states he is going to "go back home either Saturday or Monday and follow up with the [facility] to interview for my benefits.  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  His goal today was to "go to groups" and he reports he met goal.  Pt reports his "meds are working well."  Pt denies SI/HI, denies hallucinations, denies pain.  Pt has been visible in milieu interacting with peers and staff appropriately.      A: Introduced self to pt.  Met with pt 1:1.  Actively listened to pt and offered support and encouragement.  Medication administered per order.  PRN medication administered for anxiety.  Q15 minute safety checks maintained.  R: Pt is safe on the unit.  Pt is compliant with medications.  Pt verbally contracts for safety.  Will continue to monitor and assess.

## 2019-04-25 NOTE — Progress Notes (Signed)
Patient reports his family will pick him up tomorrow at 1pm in anticipation of discharge for 04/26/2019.  Enid Cutter, LCSW-A Clinical Social Worker

## 2019-04-26 MED ORDER — GABAPENTIN 100 MG PO CAPS: 200.0000 mg | ORAL_CAPSULE | Freq: Three times a day (TID) | ORAL | 0 refills | Status: DC

## 2019-04-26 MED ORDER — SERTRALINE HCL 50 MG PO TABS: 50.0000 mg | ORAL_TABLET | Freq: Every day | ORAL | 0 refills | Status: DC

## 2019-04-26 MED ORDER — LACTULOSE 10 GM/15ML PO SOLN: 20.0000 g | Freq: Three times a day (TID) | ORAL | 0 refills | Status: AC

## 2019-04-26 MED ORDER — ALBUTEROL SULFATE HFA 108 (90 BASE) MCG/ACT IN AERS: 2.0000 | INHALATION_SPRAY | RESPIRATORY_TRACT | 0 refills | Status: DC | PRN

## 2019-04-26 MED ORDER — MIRTAZAPINE 7.5 MG PO TABS: 7.5000 mg | ORAL_TABLET | Freq: Every day | ORAL | 0 refills | Status: DC

## 2019-04-26 MED ORDER — HYDROXYZINE HCL 25 MG PO TABS: 25.0000 mg | ORAL_TABLET | Freq: Three times a day (TID) | ORAL | 0 refills | Status: AC | PRN

## 2019-04-26 MED ORDER — FAMOTIDINE 20 MG PO TABS: 20.0000 mg | ORAL_TABLET | Freq: Two times a day (BID) | ORAL | 0 refills | Status: DC

## 2019-04-26 NOTE — Progress Notes (Signed)
[facility] Group Notes:  (Nursing/MHT/Case Management/Adjunct)  Date:  04/26/2019  Time:  2030  Type of Therapy:  wrap up group  Participation Level:  Active  Participation Quality:  Appropriate, Attentive, Sharing and Supportive  Affect:  Appropriate  Cognitive:  Appropriate  Insight:  Improving  Engagement in Group:  Engaged  Modes of Intervention:  Clarification, Education and Support  Summary of Progress/Problems:  Taylor Olson 04/26/2019, 9:34 PM

## 2019-04-26 NOTE — [facility] Group Notes (Signed)
[facility] Group Notes:  (Nursing)  Date:  04/26/2019  Time: 100 PM  Type of Therapy:  Nurse Education  Participation Level:  Active  Participation Quality:  Appropriate  Affect:  Appropriate  Cognitive:  Alert and Appropriate  Insight:  Appropriate  Engagement in Group:  Engaged  Modes of Intervention:  Education  Summary of Progress/Problems: Life Skills Group  Shela Nevin 04/26/2019, 7:47 PM

## 2019-04-26 NOTE — Discharge Summary (Addendum)
Physician Discharge Summary Note  Patient:  Taylor Olson is an 58 y.o., male MRN:  [id number] DOB:  1961-08-16 Patient phone:  [phone] (home)  Patient address:   [address] [place] [address],  Total Time spent with patient: 30 minutes  Date of Admission:  04/18/2019 Date of Discharge: 04/27/19  Reason for Admission:  Intentional Overdose  Principal Problem: MDD (major depressive disorder), recurrent severe, without psychosis (HCC) Discharge Diagnoses: Principal Problem:   MDD (major depressive disorder), recurrent severe, without psychosis (HCC)   Past Psychiatric History: Patient denied any previous admissions for psychiatric reasons or psychiatric medications.  Past Medical History:  Past Medical History:  Diagnosis Date  . COPD (chronic obstructive pulmonary disease) (HCC)   . GERD (gastroesophageal reflux disease)   . History of blood clots     Past Surgical History:  Procedure Laterality Date  . CARDIAC CATHETERIZATION     Family History: History reviewed. No pertinent family history. Family Psychiatric  History: None reported Social History:  Social History   Substance and Sexual Activity  Alcohol Use Not Currently  . Frequency: Never     Social History   Substance and Sexual Activity  Drug Use Never    Social History   Socioeconomic History  . Marital status: Married    Spouse name: Not on file  . Number of children: 2  . Years of education: Not on file  . Highest education level: Not on file  Occupational History  . Not on file  Social Needs  . Financial resource strain: Patient refused  . Food insecurity:    Worry: Patient refused    Inability: Patient refused  . Transportation needs:    Medical: Patient refused    Non-medical: Patient refused  Tobacco Use  . Smoking status: Former Games developer  . Smokeless tobacco: Never Used  Substance and Sexual Activity  . Alcohol use: Not Currently    Frequency: Never  . Drug use: Never  .  Sexual activity: Not Currently  Lifestyle  . Physical activity:    Days per week: Patient refused    Minutes per session: Patient refused  . Stress: Not on file  Relationships  . Social connections:    Talks on phone: Patient refused    Gets together: Patient refused    Attends religious service: Patient refused    Active member of club or organization: Patient refused    Attends meetings of clubs or organizations: Patient refused    Relationship status: Patient refused  Other Topics Concern  . Not on file  Social History Narrative  . Not on file    Hospital Course:   MD Assessment 04/19/19: Patient is a 58 year old male with a past psychiatric history significant for history of alcohol dependence, hepatitis, portal hypertension, previous pulmonary emboli, and previous episodes of syncope who originally presented to the [facility] emergency department on 04/13/2019 after an intentional overdose of baclofen. Patient was transferred from [facility] and was placed in the ICU. He had episodes of delirium and received propofol as well as Precedex.He was eventually weaned off the ventilator and assessed by psychiatric consultation. He was transferred to our facility for evaluation and stabilization. In my interview today the patient stated that he was unaware that he had done the overdose, he said it was accidental. There are notes in the chart that state that this was an intentional overdose to kill himself. There is also a note in the chart from nursing who spoke to  his brother-in-law that the patient did live in [place] in a camper. He had been recently admitted to [facility] secondary to syncope. He had been discharged on good Friday. He drove the patient to his home in [place], and the patient is essentially homeless. He is apparently a veteran and does have veterans benefits. The patient stated that he had been sad after his wife and lifting.  He stated that his wife was unable to tolerate his chronic medical illnesses and his inability to be able to work. Despite the history in the chart the patient denied any substance abuse, and when his cirrhosis diagnosis was discussed he stated he was unaware of why he had cirrhosis. On admission today he is significantly fatigued, he is anemic, and leukopenic. He had a fall earlier today on the unit. He is now on one-to-one, and also he tested positive for MRSA screen. He is on contact precautions. His most recent laboratories revealed a normal ALT and AST, his ammonia was 78 on 4/24. His white blood cell count is 2.9, hemoglobin is 8.2, hematocrit is 24.6. His platelets are 86,000. PT was 14.4, INR was 1.1. Chest x-ray from 4/20 showed no acute abnormality. His EKG had a poor baseline, but was sinus rhythm. Because of his intentional overdose he was admitted to the psychiatric hospital for evaluation and stabilization.  [facility] for 8 days. The patient stabilized on medication and therapy. Patient was discharged on gabapentin 200 mg TID, Vistaril 25 mg TID PRN, Zoloft 50 mg Daily and Remeron 7.5 mg QHS. Patient was also discharged with home medications and 14 days of samples. Patient has shown improvement with improved mood, affect, sleep, appetite, and interaction. Patient has attended group and participated. Patient has been seen in the day room interacting with peers and staff appropriately. Patient denies any SI/HI/AVH and contracts for safety. Patient agrees to [facility]. Patient is provided with prescriptions for their medications upon discharge.  Physical Findings: AIMS: Facial and Oral Movements Muscles of Facial Expression: None, normal Lips and Perioral Area: None, normal Jaw: None, normal Tongue: None, normal,Extremity Movements Upper (arms, wrists, hands, fingers): None, normal Lower (legs, knees, ankles, toes): None, normal, Trunk  Movements Neck, shoulders, hips: None, normal, Overall Severity Severity of abnormal movements (highest score from questions above): None, normal Incapacitation due to abnormal movements: None, normal Patient's awareness of abnormal movements (rate only patient's report): No Awareness, Dental Status Current problems with teeth and/or dentures?: No Does patient usually wear dentures?: No  CIWA:  CIWA-Ar Total: 0 COWS:  COWS Total Score: 2  Musculoskeletal: Strength & Muscle Tone: decreased Gait & Station: unsteady, Uses a walker to ambulate Patient leans: N/A  Psychiatric Specialty Exam: Physical Exam  Nursing note and vitals reviewed. Constitutional: He is oriented to person, place, and time. He appears well-developed and well-nourished.  Cardiovascular: Normal rate.  Respiratory: Effort normal.  Neurological: He is alert and oriented to person, place, and time.  Skin: Skin is warm.    Review of Systems  Constitutional: Negative.   HENT: Negative.   Eyes: Negative.   Respiratory: Negative.   Cardiovascular: Negative.   Gastrointestinal: Negative.   Genitourinary: Negative.   Musculoskeletal: Negative.   Skin: Negative.   Neurological: Negative.   Endo/Heme/Allergies: Negative.   Psychiatric/Behavioral: Negative.     Blood pressure 128/80, pulse 80, temperature 98.7 F (37.1 C), temperature source Oral, resp. rate 20, height 5\' 9"  (1.753 m), weight 87.3  kg, SpO2 99 %.Body mass index is 28.41 kg/m.  General Appearance: Casual  Eye Contact:  Good  Speech:  Clear and Coherent and Normal Rate  Volume:  Normal  Mood:  Euthymic  Affect:  Congruent  Thought Process:  Coherent and Descriptions of Associations: Intact  Orientation:  Full (Time, Place, and Person)  Thought Content:  WDL  Suicidal Thoughts:  No  Homicidal Thoughts:  No  Memory:  Immediate;   Good Recent;   Good Remote;   Good  Judgement:  Fair  Insight:  Fair  Psychomotor Activity:  Normal   Concentration:  Concentration: Good  Recall:  Good  Fund of Knowledge:  Good  Language:  Good  Akathisia:  No  Handed:  Right  AIMS (if indicated):     Assets:  Communication Skills Desire for Improvement Financial Resources/Insurance Housing Resilience Social Support Transportation  ADL's:  Intact  Cognition:  WNL  Sleep:  Number of Hours: 5.75     Have you used any form of tobacco in the last 30 days? (Cigarettes, Smokeless Tobacco, Cigars, and/or Pipes): No  Has this patient used any form of tobacco in the last 30 days? (Cigarettes, Smokeless Tobacco, Cigars, and/or Pipes) No  Blood Alcohol level:  No results found for: [facility]  Metabolic Disorder Labs:  No results found for: HGBA1C, MPG No results found for: PROLACTIN Lab Results  Component Value Date   TRIG 88 04/13/2019    See Psychiatric Specialty Exam and Suicide Risk Assessment completed by Attending Physician prior to discharge.  Discharge destination:  Home  Is patient on multiple antipsychotic therapies at discharge:  No   Has Patient had three or more failed trials of antipsychotic monotherapy by history:  No  Recommended Plan for Multiple Antipsychotic Therapies: NA   Allergies as of 04/26/2019      Reactions   Tramadol Other (See Comments)   On paperwork from [facility] -Pt reports seizures      Medication List    STOP taking these medications   atorvastatin 40 MG tablet Commonly known as:  LIPITOR   cloNIDine 0.2 MG tablet Commonly known as:  CATAPRES   famotidine 40 MG/5ML suspension Commonly known as:  PEPCID Replaced by:  famotidine 20 MG tablet   furosemide 40 MG tablet Commonly known as:  LASIX   LORazepam 1 MG tablet Commonly known as:  ATIVAN     TAKE these medications     Indication  albuterol 108 (90 Base) MCG/ACT inhaler Commonly known as:  VENTOLIN HFA Inhale 2 puffs into the lungs every 4 (four) hours as needed for wheezing or shortness of breath.  Indication:   Per PCP   famotidine 20 MG tablet Commonly known as:  PEPCID Take 1 tablet (20 mg total) by mouth 2 (two) times daily. Replaces:  famotidine 40 MG/5ML suspension  Indication:  Gastroesophageal Reflux Disease   gabapentin 100 MG capsule Commonly known as:  NEURONTIN Take 2 capsules (200 mg total) by mouth 3 (three) times daily. What changed:    medication strength  how much to take  Indication:  Abuse or Misuse of Alcohol   hydrOXYzine 25 MG tablet Commonly known as:  ATARAX/VISTARIL Take 1 tablet (25 mg total) by mouth 3 (three) times daily as needed for anxiety.  Indication:  Feeling Anxious   lactulose 10 GM/15ML solution Commonly known as:  CHRONULAC Take 30 mLs (20 g total) by mouth 3 (three) times daily.  Indication:  Per PCP   menthol-cetylpyridinium 3  MG lozenge Commonly known as:  CEPACOL Take 1 lozenge (3 mg total) by mouth as needed for sore throat.  Indication:  Per PCP   mirtazapine 7.5 MG tablet Commonly known as:  REMERON Take 1 tablet (7.5 mg total) by mouth at bedtime.  Indication:  Major Depressive Disorder   mupirocin ointment 2 % Commonly known as:  BACTROBAN Place 1 application into the nose 2 (two) times daily.  Indication:  Per PCP   phenol 1.4 % Liqd Commonly known as:  CHLORASEPTIC Use as directed 1 spray in the mouth or throat as needed for throat irritation / pain.  Indication:  Per PCP   sertraline 50 MG tablet Commonly known as:  ZOLOFT Take 1 tablet (50 mg total) by mouth daily. Start taking on:  Apr 27, 2019  Indication:  Major Depressive Disorder      Follow-up Information    [facility] Follow up on 04/29/2019.   Why:  Hospital follow up appointment is Tuesday, 5/5 at 1:00p.  The appointment will be held over the phone. The therapist will contact you.  Please expect a call a few days before your appointment to complete your paperwork.  Contact information: [address] [place]  [address] [phone]           Follow-up recommendations:Continue activity as tolerated. Continue diet as recommended by your PCP. Ensure to keep all appointments with outpatient providers.  Comments:  Patient is instructed prior to discharge to: Take all medications as prescribed by his/her mental healthcare provider. Report any adverse effects and or reactions from the medicines to his/her outpatient provider promptly. Patient has been instructed & cautioned: To not engage in alcohol and or illegal drug use while on prescription medicines. In the event of worsening symptoms, patient is instructed to call the crisis hotline, 911 and or go to the nearest ED for appropriate evaluation and treatment of symptoms. To follow-up with his/her primary care provider for your other medical issues, concerns and or health care needs.    Signed: Gerlene Burdock Money, FNP 04/26/2019, 9:48 AM   Patient seen, Suicide Assessment Completed.  Disposition Plan Reviewed

## 2019-04-26 NOTE — Progress Notes (Signed)
D Pt observed OOB UAL on the 300 hall today. Pt focused on receiving PT before he is discharged home, despite his obvious ability to ambulate safey independently. He is sad, depressed but appropriate.     A He completed his daily assessment and on this he wrote he denied SI today and he rated his depression, hopelessness and anxiety " 0/0/0/", respectively. MD met with pt and wrote DC order and pt adamant that he is not "ready" to be dc'd home today. LCSW able to arrange [facility] sponsored cab voucher home to [place] and pt got very agitated and MD cancelled dc. Pt given prn vistairl per his request-with pos results noted.     R Safety in place. Pt stated he will go home tomorrow.

## 2019-04-26 NOTE — BHH Group Notes (Signed)
LCSW Group Therapy Note  04/26/2019   10:00-11:00am   Type of Therapy and Topic:  Group Therapy: Anger Cues and Responses  Participation Level:  Active   Description of Group:   In this group, patients learned how to recognize the physical, cognitive, emotional, and behavioral responses they have to anger-provoking situations.  They identified a recent time they became angry and how they reacted.  They analyzed how their reaction was possibly beneficial and how it was possibly unhelpful.  The group discussed a variety of healthier coping skills that could help with such a situation in the future.  Deep breathing was practiced briefly.  Therapeutic Goals: 1. Patients will remember their last incident of anger and how they felt emotionally and physically, what their thoughts were at the time, and how they behaved. 2. Patients will identify how their behavior at that time worked for them, as well as how it worked against them. 3. Patients will explore possible new behaviors to use in future anger situations. 4. Patients will learn that anger itself is normal and cannot be eliminated, and that healthier reactions can assist with resolving conflict rather than worsening situations.  Summary of Patient Progress:  The patient shared that his most recent time of anger was this morning, because the physical therapist he was expecting did not come and said he was "pissed" but talked it out with the doctor.  Therapeutic Modalities:   Cognitive Behavioral Therapy  Lynnell Chad

## 2019-04-26 NOTE — BHH Suicide Risk Assessment (Signed)
[facility] Dis[facility] Suicide Risk Assessment   Principal Problem: MDD (major depressive disorder), recurrent severe, without psychosis (HCC) Discharge Diagnoses: Principal Problem:   MDD (major depressive disorder), recurrent severe, without psychosis (HCC)   Total Time spent with patient: 30 minutes  Musculoskeletal: Strength & Muscle Tone: within normal limits Gait & Station: ambulation is much improved compared to admission Patient leans: N/A  Psychiatric Specialty Exam: ROS patient denies chest pain or shortness of breath, no chest pain, no cough, no fever  Blood pressure 128/80, pulse 80, temperature 98.7 F (37.1 C), temperature source Oral, resp. rate 20, height 5\' 9"  (1.753 m), weight 87.3 kg, SpO2 99 %.Body mass index is 28.41 kg/m.  General Appearance: improving grooming   Eye Contact::  Good  Speech:  Normal Rate409  Volume:  Normal  Mood:  reports mood has improved compared to how he felt at admission  Affect:  vaguely irritable but reactive and brightens during interactions  Thought Process:  Linear and Descriptions of Associations: Intact  Orientation:  Full (Time, Place, and Person)  Thought Content:  no hallucinations, no delusions, not internally preoccupied   Suicidal Thoughts:  No currently denies suicidal or self injurious ideations, denies homicidal or violent ideations  Homicidal Thoughts:  No  Memory:  recent and remote grossly intact   Judgement:  improving  Insight:  Fair/ improving  Psychomotor Activity:  no psychomotor agitation or restlessness   Concentration:  improved   Recall:  Good  Fund of Knowledge:Good  Language: Good  Akathisia:  Negative  Handed:  Right  AIMS (if indicated):     Assets:  Desire for Improvement Resilience  Sleep:  Number of Hours: 5.75  Cognition: WNL  ADL's:  Intact   Mental Status Per Nursing Assessment::   On Admission:  Suicidal ideation indicated by others, Self-harm behaviors  Demographic Factors:  [age] year old  male, Research scientist (life sciences)avy Veteran , plans to live with sister/brother in law   Loss Factors: Housing issues , separation  Historical Factors: History of depression, denies prior psychiatric admissions   Risk Reduction Factors:   Positive coping skills or problem solving skills  Continued Clinical Symptoms:  Patient reports he is feeling better than he did on admission. Denies suicidal or self injurious ideations, denies homicidal or violent ideations, no hallucinations, no delusions , future oriented , stating he is hoping he will be able to provide peer support services in the future . Currently denies medication side effects.  Ambulation noted to be much improved and steadier than on admission. He has had no falls, no  syncopal episodes on unit, and denies dizziness or lightheadedness at this time No agitated or disruptive behaviors on unit.   Cognitive Features That Contribute To Risk:  No gross cognitive deficits noted upon discharge. Is alert , attentive, and oriented x 3   Suicide Risk:  Mild:  Suicidal ideation of limited frequency, intensity, duration, and specificity.  There are no identifiable plans, no associated intent, mild dysphoria and related symptoms, good self-control (both objective and subjective assessment), few other risk factors, and identifiable protective factors, including available and accessible social support.  Follow-up Information    [facility] Follow up on 04/29/2019.   Why:  Hospital follow up appointment is Tuesday, 5/5 at 1:00p.  The appointment will be held over the phone. The therapist will contact you.  Please expect a call a few days before your appointment to complete your paperwork.  Contact information: [address] [place] [address]  [phone]           Plan Of Care/Follow-up recommendations:  Activity:  as tolerated  Diet:  heart healthy Tests:  NA Other:  See below  Patient reports readiness for discharge- states his  brother in law will pick him up later today, plans to go live with sister . Plans to follow up at [facility] in [place] for outpatient treatment, has also been given appointment to follow up at [facility]. I have stressed importance of following up soon with his PCP for medical monitoring and management .   Craige Cotta, MD 04/26/2019, 9:57 AM

## 2019-04-26 NOTE — BHH Counselor (Signed)
Clinical Social Work Note  At nurse request, CSW talked with supervisor and was able to obtain permission for patient to be taken home in a taxi, since he stated his family could not come get him.  Before the voucher could be completed, CSW had been informed that he was now stating his family was not at home, so he could not be taken there by taxi.  When asked where his family is, he said they have gone to visit another family member and would be home "later tonight maybe."  He remained fixated on getting a Home Health Care referral from Physical Therapy coming back to see him, is angry that this has not yet happened.  Upon reading the chart, it seems that Occupational Therapy signed off on him and Physical Therapy indicated he would need PT 3x per week.  Patient engaged in significant staff-splitting behaviors, claiming doctor and Administrative Coordinator had promised him a longer stay, which both denied.  When confronted with this, he stated staff members were lying.  CSW told him that phone call would be made to his family to arrange for transport home tomorrow, and he became angry, saying that staff does not have his permission to talk with his family.  Upon exploration of his written consents, this was found to be accurate, and there is no consent for [place] to speak with anyone.  Ambrose Mantle, LCSW 04/26/2019, 4:18 PM

## 2019-04-27 NOTE — Progress Notes (Signed)
  [facility] Adult Case Management Discharge Plan :  Will you be returning to the same living situation after discharge:  Yes,  with family At discharge, do you have transportation home?: Yes,  states family will come after lunch to get him Do you have the ability to pay for your medications: No.  Samples for 14 days provided.  Has signed up for [facility] services, will find out soon if that will go through.  Release of information consent forms completed and turned in to Medical Records by CSW.   Patient to Follow up at: Follow-up Information    [facility] Follow up on 04/29/2019.   Why:  Hospital follow up appointment is Tuesday, 5/5 at 1:00p.  The appointment will be held over the phone. The therapist will contact you.  Please expect a call a few days before your appointment to complete your paperwork.  Contact information: [address] [place] [address] [phone]         [facility] Follow up.   Why:  You can receive medical services at this clinic until you are fully enrolled in the [employer] for services.  Please call for an appointment as desired/needed.  They can help with physical therapy referral. Contact information: [address] [facility] [address] [phone]          Next level of care provider has access to [facility] Link:no  Safety Planning and Suicide Prevention discussed: No.  Reviewed with patient alone, as he declined contact with anyone in his life  Have you used any form of tobacco in the last 30 days? (Cigarettes, Smokeless Tobacco, Cigars, and/or Pipes): No  Has patient been referred to the Quitline?: N/A patient is not a smoker  Patient has been referred for addiction treatment: Yes  Lynnell Chad, LCSW 04/27/2019, 9:35 AM

## 2019-04-27 NOTE — BHH Group Notes (Signed)
MHT completed a morning check in. Pt goal for today is to get in touch with family and get discharged.
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
## 2019-04-27 NOTE — Progress Notes (Signed)
Pt observed walking around the unit today and yesterday- he says " I gotta have PT in order to go home.Marland Kitchenibuprofen can't walk without a walker". Writer observed pt ambulate more than 50 ft he moves easily, he is steady on his feet". He isists he cannot walk without the use of a walker. LCSW made arrangements yesterday for pt to have transportation to his  home in [place] and pt stated " it wont do any good...nobody'll be there". Pt adamant that " I need physical therapy". Writer asked pt why he felt like he needed home PT and he ssaid " I dont have any way to get to the hospital- where PT is done". Pt angry and sullen about being discharged. He says " that therapist lied to me...she said she'd be back on Friday to see me" and then he said " And I dont believe the doctor". He completed his daily assessment and on this he wrote he denied SI today and he rated his depression, hopelessness and anxiety "5/0/8", respectively.DC was cancelled last night- after dc arrangements made and pt stated he didn't want to go and he couldn't get in his house and " I'll be ready to go on Sunday". Today, pt has been quiet, reserved,  Rolling his eyes at this writer " well I guess youre just going to put me out on the street"..MD completed DC order, dc instructions reviewed with pt and pt stated verbal understanding, pt given cc of dc f/u ( SRA, AVS, SSP and transition record). Pt escorted to bldg entrance and dc'd per MD order.

## 2019-04-27 NOTE — BHH Suicide Risk Assessment (Signed)
[facility]   Principal Problem: MDD (major depressive disorder), recurrent severe, without psychosis (HCC) Discharge Diagnoses: Principal Problem:   MDD (major depressive disorder), recurrent severe, without psychosis (HCC)   Total Time spent with patient: 30 minutes  Musculoskeletal: Strength & Muscle Tone: within normal limits Gait & Station: ambulates with walker  Patient leans: N/A  Psychiatric Specialty Exam: ROS no chest pain, no shortness of breath, no cough, no fever   Blood pressure 120/83, pulse 78, temperature 98.1 F (36.7 C), temperature source Oral, resp. rate 20, height 5\' 9"  (1.753 m), weight 87.3 kg, SpO2 99 %.Body mass index is 28.41 kg/m.  General Appearance: Fairly Groomed  Patent attorneyye Contact::  Good  Speech:  Normal Rate409  Volume:  Normal  Mood:  improved compared to admission  Affect:  vaguely irritable, reactive  Thought Process:  Linear and Descriptions of Associations: Intact  Orientation:  Full (Time, Place, and Person)  Thought Content:  no hallucinations, no delusions, not internally preoccupied   Suicidal Thoughts:  No denies suicidal or self injurious ideations, denies homicidal or violent ideations  Homicidal Thoughts:  No  Memory:  recent and remote grossly intact   Judgement:  Other:  improving   Insight:  fair/ improving   Psychomotor Activity:  NA  Concentration:  Good  Recall:  Good  Fund of Knowledge:Good  Language: Good  Akathisia:  Negative  Handed:  Right  AIMS (if indicated):     Assets:  Communication Skills Resilience  Sleep:  Number of Hours: 5.75  Cognition: WNL  ADL's:  Intact   Mental Status Per Nursing Assessment::   On Admission:  Suicidal ideation indicated by others, Self-harm behaviors  Demographic Factors:  58 year old male, Research scientist (life sciences)avy Veteran , plans to live with sister/brother in law   Loss Factors: Housing issues and separation  Historical Factors: No prior psychiatric admissions, history  of depression  Risk Reduction Factors:   Living with another person, especially a relative and Positive coping skills or problem solving skills  Continued Clinical Symptoms:  Patient had been discharged yesterday but later in the day reported that he did not feel ready and because of transportation concerns . Today he states " I am ready".  Presents alert, attentive, calm, vaguely irritable , but affect reactive, no thought disorder, denies suicidal or homicidal ideations, and is future oriented, [facility] . No disruptive or agitated behaviors on unit. Denies medication side effects. States he is irritated with his family because " they are not wanting me to see my wife ". Explains that [place] is currently in [facility], he will be living with brother in law, and that family members " do not want us together at this time". Of note, denies any homicidal or violent ideations towards any family member .   Cognitive Features That Contribute To Risk:  No gross cognitive deficits noted upon discharge. Is alert , attentive, and oriented x 3   Suicide Risk:  Mild:  Suicidal ideation of limited frequency, intensity, duration, and specificity.  There are no identifiable plans, no associated intent, mild dysphoria and related symptoms, good self-control (both objective and subjective assessment), few other risk factors, and identifiable protective factors, including available and accessible social support.  Follow-up Information    [facility] Follow up on 04/29/2019.   Why:  Hospital follow up appointment is Tuesday, 5/5 at 1:00p.  The appointment will be held over the phone. The therapist will contact  you.  Please expect a call a few days before your appointment to complete your paperwork.  Contact information: [address] [place] [address] [phone]        [facility] Follow up.   Why:  You can receive medical services at  this clinic until you are fully enrolled in the [employer] for services.  Please call for an appointment as desired/needed.  They can help with physical therapy referral. Contact information: [address] [place] [address] [phone]          Plan Of Care/Follow-up recommendations:  Activity:  as tolerated Diet:  heart healthy Tests:  NA Other:  See below  Patient expresses readiness for discharge today. States brother in law is coming to pick him up later today. Plans to live with family members Plans to follow up as above, but states his plan is to establish care at the [place].  Craige Cotta, MD 04/27/2019, 10:31 AM

## 2019-04-27 NOTE — BHH Group Notes (Signed)
BHH LCSW Group Therapy Note  04/27/2019  10:00-11:00AM  Type of Therapy and Topic:  Group Therapy:  Adding Supports Including Yourself  Participation Level:  Active   Description of Group:  Patients in this group were introduced to the concept that additional supports including self-support are an essential part of recovery.  Patients listed what supports they believe they need to add to their lives to achieve their goals at discharge, and they listed such things as therapist, family, doctor, support groups, and service dog.  CSW described the  continuum of mental health/substance abuse services available and the group discussed the differences among these including support group, therapy group, 12-step group, Doctor, hospital, Secondary school teacher, and such.  A song entitled "My Own Hero" was played and a group discussion ensued in which patients stated they could relate to the song and it inspired them to realize they have be willing to help themselves in order to succeed, because other people cannot achieve sobriety or stability for them.  A song was played called "I Am Enough" which led to a discussion about being willing to believe we are worth the effort of being a self-support.  Group members expressed appreciation for each other.  Therapeutic Goals: 1)  demonstrate the importance of being a key part of one's own support system 2)  discuss various available supports 3)  encourage patient to use music as part of their self-support and focus on goals 4)  elicit ideas from patients about supports that need to be added   Summary of Patient Progress:  The patient expressed a support that he would like to add is going to [facility] for outpatient mental health care and [facility] for medical care until his [employer] benefits are approved and he can start going there.  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  Therapeutic Modalities:   Motivational Interviewing Activity  Lynnell Chad

## 2019-04-30 ENCOUNTER — Encounter: Payer: Self-pay | Admitting: Family Medicine

## 2019-04-30 ENCOUNTER — Ambulatory Visit: Payer: Non-veteran care | Attending: Family Medicine | Admitting: Family Medicine

## 2019-04-30 ENCOUNTER — Other Ambulatory Visit: Payer: Self-pay

## 2019-04-30 DIAGNOSIS — F332 Major depressive disorder, recurrent severe without psychotic features: Secondary | ICD-10-CM

## 2019-04-30 DIAGNOSIS — M5441 Lumbago with sciatica, right side: Secondary | ICD-10-CM | POA: Diagnosis not present

## 2019-04-30 DIAGNOSIS — J449 Chronic obstructive pulmonary disease, unspecified: Secondary | ICD-10-CM

## 2019-04-30 DIAGNOSIS — G8929 Other chronic pain: Secondary | ICD-10-CM

## 2019-04-30 DIAGNOSIS — K219 Gastro-esophageal reflux disease without esophagitis: Secondary | ICD-10-CM | POA: Diagnosis not present

## 2019-04-30 NOTE — Progress Notes (Signed)
Virtual Visit via Telephone Note  I connected with Taylor Olson on 04/30/19 at  9:30 AM EDT by telephone and verified that I am speaking with the correct person using two identifiers.   I discussed the limitations, risks, security and privacy concerns of performing an evaluation and management service by telephone and the availability of in person appointments. I also discussed with the patient that there may be a patient responsible charge related to this service. The patient expressed understanding and agreed to proceed.  Patient Location: Home Provider Location: Office Others participating in call: [place], RMA   History of Present Illness:      58 yo male new to the practice who is status post hospitalization from 04/18/19-04/27/19 through [employer].  Per his hospital records, patient with alcohol dependence, hepatitis, hypertension, history of pulmonary embolism.  According to his hospital records, patient initially was brought to the emergency department in [place], [place] on 04/13/2019 due to an overdose of baclofen and he was admitted to the ICU as patient required intubation.  After he was weaned off the ventilator, patient had psychiatric evaluation and was transferred to behavioral health facility at [facility].  Patient was diagnosed with major depressive disorder.  He also had imaging consistent with cirrhosis of the liver.        Patient states that he was not intending to take too much baclofen in a suicide attempt but rather was trying to take medication to help with chronic back pain and muscle spasms.  Patient states that he is currently using a walker due to the back pain which also causes him to have issues with his balance and patient states that he is interested in obtaining/using a cane.  He also reports that he needs refills of his medications.  Patient states that he believes that he was supposed to be discharged to [facility] services but this  failed through due to his insurance.  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  He states that because of the coronavirus/COVID-19 he was told that the [place] is not taking any new patients at this time.  Patient reports that he was living in [facility] but had an episode in which he believes that he passed out and he ended up in the hospital.  He states that his brother then picked him up from the hospital and brought the patient to [place].  Patient reports that he was not working due to his chronic back pain and other health issues and had been living in a camper.  Patient reports medical history of COPD and states that he did have a nebulizer in the past but he believes that this may still be in [place].  He has also had blood clots in his lungs in the past.  His major issue however is his chronic low back pain with radiation of a sharp pain down his right leg and he states that the pain is nearly always a 10 on a 0-to-10 scale with 0 being no pain it to be reviewed the worst pain imaginable.  He does believe that the medication prescribed per psychiatry during his hospitalization has helped with his mood.  He denies any suicidal thoughts or ideations.   Past Medical History:  Diagnosis Date   COPD (chronic obstructive pulmonary disease) (HCC)    GERD (gastroesophageal reflux  disease)    History of blood clots     Past Surgical History:  Procedure Laterality Date   CARDIAC CATHETERIZATION      Family History  Problem Relation Age of Onset   COPD Mother    Leukemia Mother    Hypertension Father    Diabetes Father     Social History   Tobacco Use   Smoking status: Former Smoker   Smokeless tobacco: Never Used  Substance Use Topics   Alcohol use: Not Currently    Frequency: Never   Drug use: Never     Allergies   Allergen Reactions   Tramadol Other (See Comments)    On paperwork from [facility] -Pt reports seizures    Review of Systems  Constitutional: Positive for malaise/fatigue. Negative for chills and fever.  HENT: Negative for congestion and sore throat.   Respiratory: Negative for cough and shortness of breath.   Cardiovascular: Negative for chest pain and palpitations.  Gastrointestinal: Positive for heartburn. Negative for abdominal pain, constipation, diarrhea, nausea and vomiting.  Genitourinary: Negative for dysuria and frequency.  Musculoskeletal: Positive for back pain and myalgias.  Skin: Negative for itching and rash.  Neurological: Negative for dizziness and headaches.  Endo/Heme/Allergies: Negative for polydipsia. Does not bruise/bleed easily.  Psychiatric/Behavioral: Negative for suicidal ideas. The patient is nervous/anxious and has insomnia.      Observations/Objective: No vital signs or physical exam conducted as visit was done via telephone due to limitations/restrictions on in office visits due to the current COVID-19 pandemic  Assessment and Plan: 1. MDD (major depressive disorder), recurrent severe, without psychosis (HCC) Patient's hospital admission and discharge summaries reviewed regarding his overdose of baclofen and subsequent 8-day inpatient behavioral health admission.  Patient was felt to be stable at the time of discharge with the use of prescribed medications and he is to be contacted regarding counseling as per hospital discharge.  He will also be referred to medical social worker through this office for assistance with anxiety/depression and obtaining VA follow-up or a TRICARE provider.  As patient currently resides in [place] with his brother, I am not sure if patient may be eligible for continued medical services closer to his current residence through a program similar to this office in [place], called [facility]. - Ambulatory referral to Social  Work  2.  COPD He denies current smoking but did smoke in the past.  Albuterol inhaler as per hospital discharge refilled  3.  GERD Pepcid refills provided and patient should avoid known trigger foods as well as avoidance of late night eating  4.  Chronic low back pain with right-sided sciatica Refill provided for gabapentin as per hospital discharge.  Patient was not seen in person at today's visit and I am not sure if it is safe for patient to use a cane as opposed to his current walker as he does state that he has issues with his balance and gait secondary to his chronic low back pain.  Hopefully patient will be able to establish with the VA/primary care provider taking TRICARE so that he can be set up for physical therapy and further evaluation of his low back pain.  Otherwise will discuss further at upcoming in person office visit.  Follow Up Instructions:Return in about 4 weeks (around 05/28/2019) for Chronic issues if not established with the [facility].    I discussed the assessment and treatment plan with the patient. The patient was provided an opportunity to ask questions and all were answered. The  patient agreed with the plan and demonstrated an understanding of the instructions.   The patient was advised to call back or seek an in-person evaluation if the symptoms worsen or if the condition fails to improve as anticipated.  I provided 17  minutes of non-face-to-face time during this encounter.   Cain Saupe, MD

## 2019-04-30 NOTE — Progress Notes (Signed)
Patient is being seen today for hospital f/u for his liver and mental health

## 2019-05-03 ENCOUNTER — Encounter: Payer: Self-pay | Admitting: Family Medicine

## 2019-05-03 MED ORDER — FAMOTIDINE 20 MG PO TABS: 20.0000 mg | ORAL_TABLET | Freq: Two times a day (BID) | ORAL | 3 refills | Status: AC

## 2019-05-03 MED ORDER — MIRTAZAPINE 7.5 MG PO TABS: 7.5000 mg | ORAL_TABLET | Freq: Every day | ORAL | 0 refills | Status: AC

## 2019-05-03 MED ORDER — SERTRALINE HCL 50 MG PO TABS: 50.0000 mg | ORAL_TABLET | Freq: Every day | ORAL | 0 refills | Status: AC

## 2019-05-03 MED ORDER — GABAPENTIN 100 MG PO CAPS: 200.0000 mg | ORAL_CAPSULE | Freq: Three times a day (TID) | ORAL | 0 refills | Status: AC

## 2019-05-03 MED ORDER — ALBUTEROL SULFATE HFA 108 (90 BASE) MCG/ACT IN AERS: 2.0000 | INHALATION_SPRAY | RESPIRATORY_TRACT | 3 refills | Status: AC | PRN

## 2019-05-03 MED FILL — MIRTAZAPINE 7.5 MG TABLET: 7.5 | 30 days supply | Qty: 30 | Fill #0

## 2019-05-03 MED FILL — GABAPENTIN 100 MG CAP: 100 | 15 days supply | Qty: 90 | Fill #0

## 2019-05-03 MED FILL — LACTULOSE 10 GM/15 ML SOLN: 10 | 2 days supply | Qty: 237 | Fill #0

## 2019-05-03 MED FILL — ALBUTEROL SULFATE HFA 108 (: 108 (90 BAS | 16 days supply | Qty: 9 | Fill #0

## 2019-05-03 MED FILL — SERTRALINE HCL 50 MG TABS: 50 | 30 days supply | Qty: 30 | Fill #0

## 2019-05-03 MED FILL — MUPIROCIN 2% OINTMENT: 2 | 11 days supply | Qty: 22 | Fill #0

## 2019-05-03 MED FILL — hydrOXYzine HCL 25 MG TABS: 25 | 10 days supply | Qty: 30 | Fill #0

## 2019-05-06 ENCOUNTER — Telehealth: Payer: Self-pay

## 2019-05-06 NOTE — Telephone Encounter (Signed)
I had also placed a social work referral so that SW could speak with patient about the disability process/Legal aide. Since he lacks insurance do you know what I need to do in order to help him get a cane (not sure if cane would be appropriate as he reports falls and is currently using a walker) and a nebulizer

## 2019-05-06 NOTE — Telephone Encounter (Signed)
Call placed to patient to discuss medical follow up at request of Dr Jillyn Hidden. He is currently staying in [place]. He was not interested in going to [facility] clinic and he said that the [place] is not accepting any new patients at this time.  He would like to follow up in person with Dr Jillyn Hidden.  He said that transportation to [place] will not be a problem. An appointment was scheduled for 06/05/2019.    He said that he had his medications transferred from [facility] to [facility] and he is waiting for delivery.  He is also requesting a cane and nebulizer. Informed him that Dr Jillyn Hidden would be notified of the request.   He also explained that he has no income and he has been denied medicaid and disability. He said he was told that someone at [facility] would be able to do the disability application for him.   Informed him that he will need to contact [employer] and apply for disability himself  This Clinic does not complete disability applications. He will also need to re-apply for medicaid through DSS.  Explained to him that [employer] [facility] [place] may be able to provide some guidance with re-applying for medicaid and disability.  He provided authorization for this CM to make the referral to Legal Aid. It was also explained that there is no guarantee that Legal Aid will be able to assist him as he is currently staying in [facility].  Legal Aid referral faxed to attention of Ranee Gosselin.

## 2019-05-08 ENCOUNTER — Other Ambulatory Visit: Payer: Self-pay | Admitting: Family Medicine

## 2019-05-08 DIAGNOSIS — R2689 Other abnormalities of gait and mobility: Secondary | ICD-10-CM

## 2019-05-08 DIAGNOSIS — J449 Chronic obstructive pulmonary disease, unspecified: Secondary | ICD-10-CM

## 2019-05-08 MED ORDER — NEBULIZER SYSTEM ALL-IN-ONE MISC: 1.0000 | Freq: Four times a day (QID) | 0 refills | Status: AC | PRN

## 2019-05-08 MED ORDER — CANE/T-HANDLE MISC: 0 refills | Status: AC

## 2019-05-08 MED ORDER — ALBUTEROL SULFATE (2.5 MG/3ML) 0.083% IN NEBU: 2.5000 mg | INHALATION_SOLUTION | Freq: Four times a day (QID) | RESPIRATORY_TRACT | 6 refills | Status: AC | PRN

## 2019-05-08 NOTE — Telephone Encounter (Addendum)
Attempted to contact patient # [phone] to discuss order for cane and nebulizer and verify insurance.  Message left requesting call back from patient to this CM # [phone]

## 2019-05-08 NOTE — Telephone Encounter (Signed)
Call received from patient. He was very upset that he had to wait an hour to get through to [facility].   This CM Inquired about the status of his insurance as he has [employer] listed in [place]. He confirmed that he is a veteran and has a DD214 but can't get to it now due to the pandemic. He said that he would prefer to go to the [place] for his medical care but "they are shut down."  He said that he never had a problem with his [place] insurance but he is now out of work and doesn't know about the insurance status. Asked if he would like the information for the [facility] and he said no, "everything is shut down."  Informed him that the order or the cane and nebulizer can be sent to [facility] and they will check the coverage. If there is no coverage, we can look at other options for obtaining the DME.  He was in agreement to sending referral to [facility].   He said that he has not heard from Legal Aid yet. Reminded him that the referral was made 2 days ago and it may take a week for them to get in touch with him. He then asked about help with medicaid application and food stamps. Informed him that he needs to contact DSS.  He said that they are shut down too. Informed him that this CM called [facility] DSS this morning and had no problem getting through.  They are not seeing people in person but can give information over the phone. He then said that someone, he could not remember who, sent him paperwork about medicaid and he signed and sent it back. He is not sure where he sent it. Reminded him that he needs to contact the DSS in the county where he is residing.   He then asked for help with disability. Informed him that he needs to call [employer]. He said that " they are all closed down" " everything is shut down."  When asked if he tried to call he  said " everything is shut down."  He heard a recording at [facility] that they are closed.  He said that he did not leave a message.  instructed him to  discuss with Legal Aid  when they call.   He then asked about housing. He does not want to go to a shelter. He wants government assistance.  Informed him that he could contact [facility]. He again got upset stating that " everything is shut down."  Informed him that Legal Aid may also help him connect with [facility]. He did not want the phone number to call himself.   This CM also informed him that the [facility] SW could call him next week to discuss his concerns. He asked " why next week?"  This explained that the SW is off until then.   This CM called [employer] # [phone] to determine how people can be assisted with disability applications as they are not seeing people in their office. This CM was informed that people can call in and set up an appointment to apply over the phone.  This CM was only on hold for 4 minutes for this call.    Call placed to patient and informed him of above call to [facility]. Encouraged him to contact them about applying for disability.  Offered to give him the phone number and he said he would find it.

## 2019-05-08 NOTE — Telephone Encounter (Signed)
Rx's printed for cane, nebulizer and albuterol nebulizer solution

## 2019-05-08 NOTE — Progress Notes (Signed)
Patient ID: Taylor Olson, male   DOB: August 21, 1961, 58 y.o.   MRN: [id number]   Patient has requested a nebulizer and cane. Will print RX's and they will be sent to a home health agency per clinic nurse coordinator. Rx also printed for albuterol nebulizer solution

## 2019-05-13 ENCOUNTER — Telehealth: Payer: Self-pay

## 2019-05-13 NOTE — Telephone Encounter (Signed)
Order for nebulizer, cane faxed to [facility]

## 2019-05-14 ENCOUNTER — Telehealth: Payer: Self-pay

## 2019-05-14 NOTE — Telephone Encounter (Signed)
As per Ranee Gosselin, [facility] [place],she has the referral and has tried to contact the patient but has not been successful. She will continue trying to reach him.

## 2019-05-22 ENCOUNTER — Telehealth: Payer: Self-pay

## 2019-05-22 NOTE — Telephone Encounter (Signed)
Call received from Daria/[facility]. She said that they placed calls to the patient on 5/19,5/20.5/21and /05/16/2019 in attempt to inform him of his financial responsibility for the items.  He has not called back and they closed the order.

## 2019-05-22 NOTE — Telephone Encounter (Signed)
Call placed to [facility] to check on status of order for nebulizer and cane. Spoke to Kinde who said that she will need to check on status and call this CM back # [phone]

## 2019-05-23 NOTE — Telephone Encounter (Signed)
reviewed

## 2019-06-05 ENCOUNTER — Ambulatory Visit: Payer: Non-veteran care | Admitting: Family Medicine

## 2019-06-11 ENCOUNTER — Telehealth: Payer: Self-pay

## 2019-06-11 NOTE — Telephone Encounter (Signed)
As per Abelino Derrick, [employer] [facility] [place], they have closed the case. They never heard from the patient.  Verdis Frederickson said that she spoke to his brother last week and the patient has returned to [facility]

## 2019-07-04 ENCOUNTER — Telehealth: Payer: Self-pay | Admitting: Licensed Clinical Social Worker

## 2019-07-04 NOTE — Telephone Encounter (Signed)
Call placed to patient to follow up on behavioral health and/or resource needs. LCSW was informed that pt no longer resides in [place].

## 2021-04-18 IMAGING — DX PORTABLE CHEST - 1 VIEW
1 series · 1 of 1 positions shown · non-contrast
Comparison: None.

CLINICAL DATA: OG tube placement.

EXAM:
PORTABLE CHEST 1 VIEW

[chest ap]
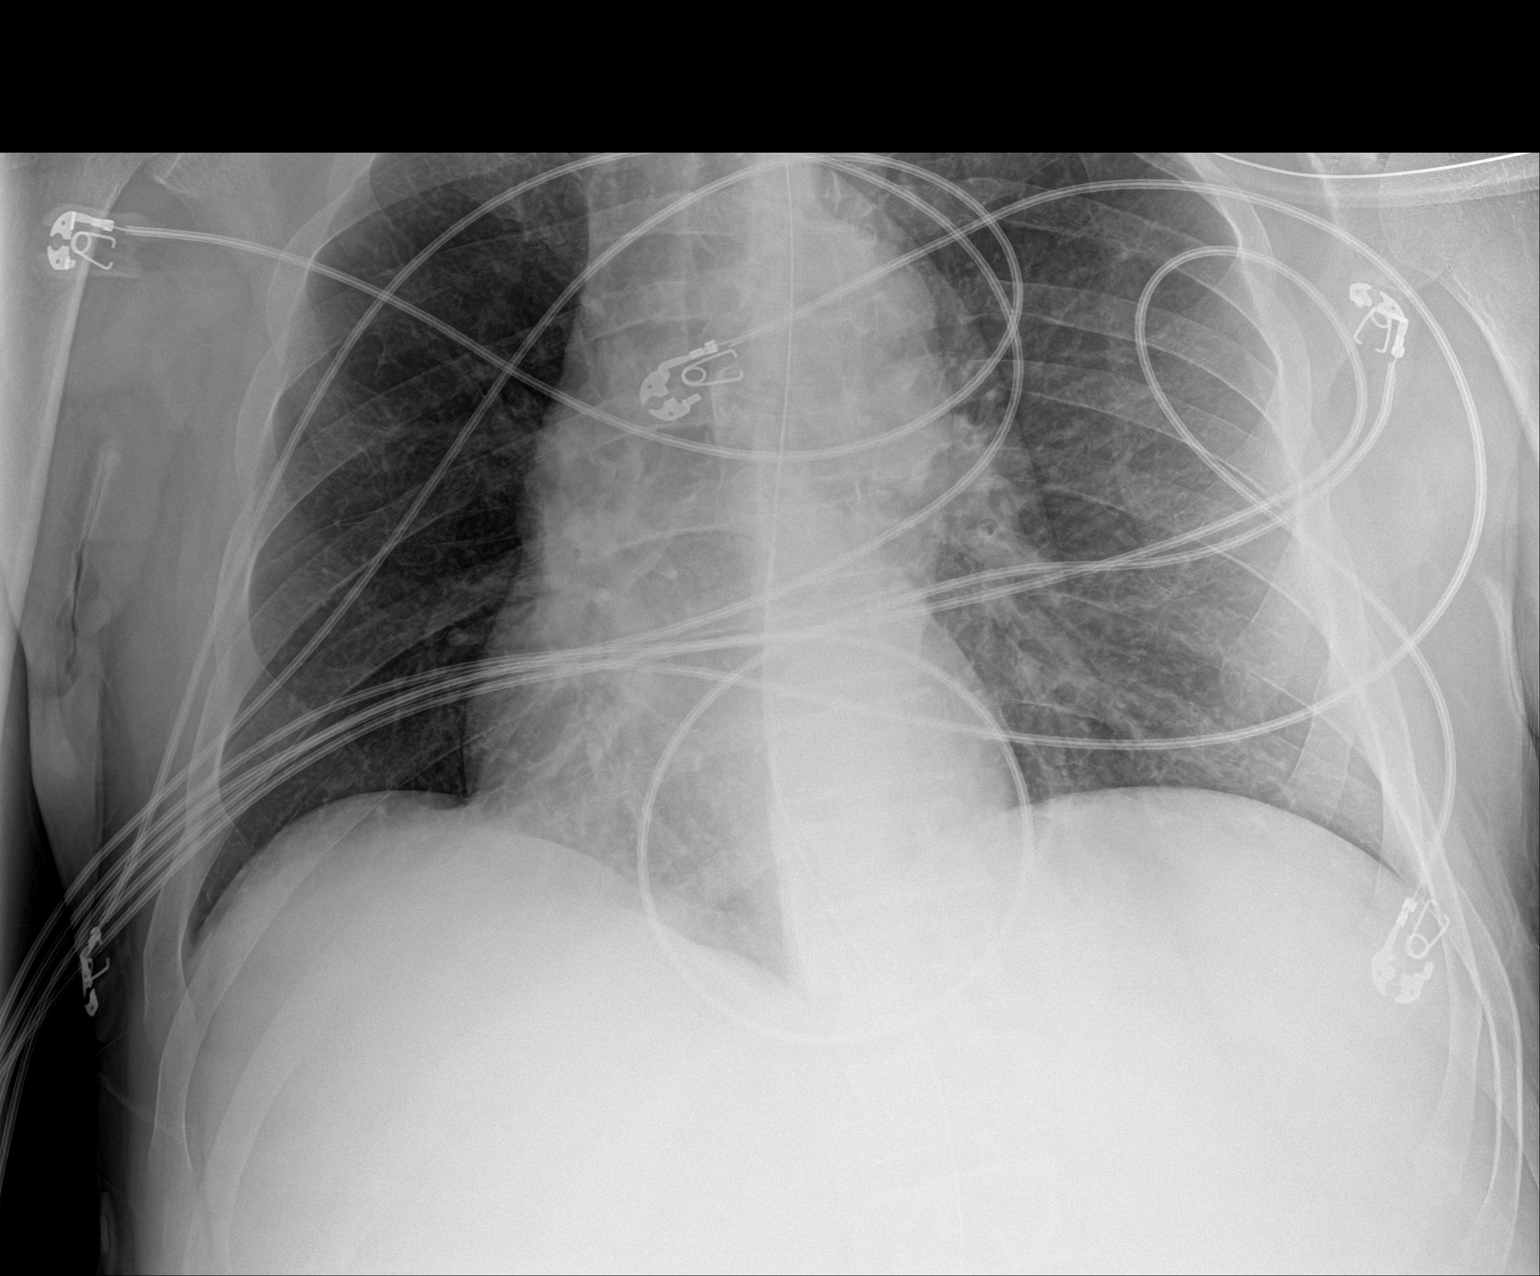

[1 of 1 positions shown; findings below may reference images not displayed]

FINDINGS: UPPER limits normal heart size noted.

An endotracheal tube is identified with tip 6 cm above the carina.

An OG tube enters the stomach with tip off the field of view.

No definite airspace disease, pleural effusion or pneumothorax.
IMPRESSION: 1. No acute abnormality
2. OG tube entering the stomach with tip off the field of view.
